# Patient Record
Sex: Female | Born: 1985 | Hispanic: No | Marital: Married | Smoking: Never smoker
Health system: Southern US, Community
[De-identification: ages and names within clinical notes are randomized; demographics above are authoritative.]

## PROBLEM LIST (undated history)

## (undated) DIAGNOSIS — R05 Cough: Secondary | ICD-10-CM

## (undated) DIAGNOSIS — J3489 Other specified disorders of nose and nasal sinuses: Secondary | ICD-10-CM

## (undated) DIAGNOSIS — R112 Nausea with vomiting, unspecified: Secondary | ICD-10-CM

## (undated) DIAGNOSIS — R079 Chest pain, unspecified: Secondary | ICD-10-CM

## (undated) DIAGNOSIS — N739 Female pelvic inflammatory disease, unspecified: Secondary | ICD-10-CM

## (undated) DIAGNOSIS — R6883 Chills (without fever): Secondary | ICD-10-CM

## (undated) DIAGNOSIS — R0600 Dyspnea, unspecified: Secondary | ICD-10-CM

## (undated) DIAGNOSIS — R51 Headache: Secondary | ICD-10-CM

## (undated) DIAGNOSIS — R519 Headache, unspecified: Secondary | ICD-10-CM

## (undated) DIAGNOSIS — R058 Other specified cough: Secondary | ICD-10-CM

## (undated) HISTORY — DX: Nausea with vomiting, unspecified: R11.2

---

## 2008-08-24 ENCOUNTER — Emergency Department (HOSPITAL_COMMUNITY): Admission: EM | Admit: 2008-08-24 | Discharge: 2008-08-24 | Payer: Self-pay | Admitting: Emergency Medicine

## 2008-09-27 ENCOUNTER — Emergency Department (HOSPITAL_COMMUNITY): Admission: EM | Admit: 2008-09-27 | Discharge: 2008-09-27 | Payer: Self-pay | Admitting: Emergency Medicine

## 2008-11-22 ENCOUNTER — Emergency Department (HOSPITAL_COMMUNITY): Admission: EM | Admit: 2008-11-22 | Discharge: 2008-11-23 | Payer: Self-pay | Admitting: Emergency Medicine

## 2009-01-22 ENCOUNTER — Emergency Department (HOSPITAL_COMMUNITY): Admission: EM | Admit: 2009-01-22 | Discharge: 2009-01-22 | Payer: Self-pay | Admitting: Family Medicine

## 2009-01-23 ENCOUNTER — Emergency Department (HOSPITAL_COMMUNITY): Admission: EM | Admit: 2009-01-23 | Discharge: 2009-01-23 | Payer: Self-pay | Admitting: Emergency Medicine

## 2009-03-04 ENCOUNTER — Emergency Department (HOSPITAL_COMMUNITY): Admission: EM | Admit: 2009-03-04 | Discharge: 2009-03-04 | Payer: Self-pay | Admitting: Emergency Medicine

## 2009-12-23 ENCOUNTER — Emergency Department (HOSPITAL_COMMUNITY): Admission: EM | Admit: 2009-12-23 | Discharge: 2009-12-23 | Payer: Self-pay | Admitting: Family Medicine

## 2009-12-28 ENCOUNTER — Emergency Department (HOSPITAL_COMMUNITY): Admission: EM | Admit: 2009-12-28 | Discharge: 2009-12-28 | Payer: Self-pay | Admitting: Emergency Medicine

## 2010-04-11 ENCOUNTER — Encounter: Admission: RE | Admit: 2010-04-11 | Discharge: 2010-04-11 | Payer: Self-pay | Admitting: Specialist

## 2011-01-23 ENCOUNTER — Emergency Department (HOSPITAL_COMMUNITY)
Admission: EM | Admit: 2011-01-23 | Discharge: 2011-01-24 | Disposition: A | Discharge status: Home / Self Care | Payer: Medicaid Other | Attending: Emergency Medicine | Admitting: Emergency Medicine

## 2011-01-23 ENCOUNTER — Emergency Department (HOSPITAL_COMMUNITY): Payer: Medicaid Other

## 2011-01-23 DIAGNOSIS — N390 Urinary tract infection, site not specified: Secondary | ICD-10-CM | POA: Insufficient documentation

## 2011-01-23 DIAGNOSIS — K625 Hemorrhage of anus and rectum: Secondary | ICD-10-CM | POA: Insufficient documentation

## 2011-01-23 DIAGNOSIS — R109 Unspecified abdominal pain: Secondary | ICD-10-CM | POA: Insufficient documentation

## 2011-01-23 LAB — COMPREHENSIVE METABOLIC PANEL
Albumin: 3.4 g/dL — ABNORMAL LOW (ref 3.5–5.2)
Alkaline Phosphatase: 73 U/L (ref 39–117)
CO2: 22 mEq/L (ref 19–32)
Creatinine, Ser: 0.59 mg/dL (ref 0.4–1.2)
Total Bilirubin: 0.2 mg/dL — ABNORMAL LOW (ref 0.3–1.2)
Total Protein: 7.2 g/dL (ref 6.0–8.3)

## 2011-01-23 LAB — URINE MICROSCOPIC-ADD ON

## 2011-01-23 LAB — CBC
Hemoglobin: 12.5 g/dL (ref 12.0–15.0)
MCH: 28 pg (ref 26.0–34.0)
Platelets: 319 10*3/uL (ref 150–400)
RBC: 4.46 MIL/uL (ref 3.87–5.11)
WBC: 7.1 10*3/uL (ref 4.0–10.5)

## 2011-01-23 LAB — URINALYSIS, ROUTINE W REFLEX MICROSCOPIC
Bilirubin Urine: NEGATIVE
Protein, ur: NEGATIVE mg/dL
Urine Glucose, Fasting: NEGATIVE mg/dL
Urobilinogen, UA: 0.2 mg/dL (ref 0.0–1.0)

## 2011-01-23 LAB — POCT PREGNANCY, URINE: Preg Test, Ur: NEGATIVE

## 2011-01-23 LAB — DIFFERENTIAL
Basophils Absolute: 0 10*3/uL (ref 0.0–0.1)
Eosinophils Absolute: 0.2 10*3/uL (ref 0.0–0.7)
Eosinophils Relative: 3 % (ref 0–5)
Lymphocytes Relative: 55 % — ABNORMAL HIGH (ref 12–46)

## 2011-01-23 LAB — OCCULT BLOOD, POC DEVICE: Fecal Occult Bld: NEGATIVE

## 2011-01-23 LAB — LIPASE, BLOOD: Lipase: 34 U/L (ref 11–59)

## 2011-01-24 MED ORDER — IOHEXOL 300 MG/ML  SOLN: 100.0000 mL | Freq: Once | INTRAMUSCULAR | Status: DC | PRN

## 2011-01-25 LAB — URINE CULTURE
Colony Count: 3000
Culture  Setup Time: 201202150841

## 2011-03-22 LAB — URINALYSIS, ROUTINE W REFLEX MICROSCOPIC
Bilirubin Urine: NEGATIVE
Glucose, UA: NEGATIVE mg/dL
Urobilinogen, UA: 0.2 mg/dL (ref 0.0–1.0)
pH: 6 (ref 5.0–8.0)

## 2011-03-22 LAB — DIFFERENTIAL
Basophils Absolute: 0.1 10*3/uL (ref 0.0–0.1)
Basophils Relative: 1 % (ref 0–1)
Eosinophils Absolute: 0.1 10*3/uL (ref 0.0–0.7)
Lymphocytes Relative: 34 % (ref 12–46)
Lymphs Abs: 3.6 10*3/uL (ref 0.7–4.0)
Neutro Abs: 6.1 10*3/uL (ref 1.7–7.7)

## 2011-03-22 LAB — CBC
HCT: 37.5 % (ref 36.0–46.0)
Platelets: 213 10*3/uL (ref 150–400)
RBC: 4.52 MIL/uL (ref 3.87–5.11)
RDW: 15.3 % (ref 11.5–15.5)

## 2011-03-22 LAB — COMPREHENSIVE METABOLIC PANEL
Albumin: 4.2 g/dL (ref 3.5–5.2)
Alkaline Phosphatase: 85 U/L (ref 39–117)
Chloride: 104 mEq/L (ref 96–112)
GFR calc Af Amer: >60 mL/min (ref >60)
GFR calc non Af Amer: >60 mL/min (ref >60)
Potassium: 3.9 mEq/L (ref 3.5–5.1)

## 2011-03-22 LAB — HCG, QUANTITATIVE, PREGNANCY: hCG, Beta Chain, Quant, S: <2 m[IU]/mL (ref <5)

## 2011-03-22 LAB — URINE MICROSCOPIC-ADD ON

## 2011-03-27 LAB — COMPREHENSIVE METABOLIC PANEL
ALT: 9 U/L (ref 0–35)
ALT: 9 U/L (ref 0–35)
AST: 14 U/L (ref 0–37)
Albumin: 3.7 g/dL (ref 3.5–5.2)
Albumin: 3.8 g/dL (ref 3.5–5.2)
Alkaline Phosphatase: 76 U/L (ref 39–117)
Alkaline Phosphatase: 80 U/L (ref 39–117)
BUN: 6 mg/dL (ref 6–23)
Calcium: 8.5 mg/dL (ref 8.4–10.5)
Chloride: 105 mEq/L (ref 96–112)
GFR calc Af Amer: >60 mL/min (ref >60)
GFR calc Af Amer: >60 mL/min (ref >60)
Potassium: 3.5 mEq/L (ref 3.5–5.1)
Potassium: 3.7 mEq/L (ref 3.5–5.1)
Sodium: 136 mEq/L (ref 135–145)
Sodium: 138 mEq/L (ref 135–145)
Total Bilirubin: 0.3 mg/dL (ref 0.3–1.2)
Total Protein: 6.5 g/dL (ref 6.0–8.3)
Total Protein: 6.6 g/dL (ref 6.0–8.3)

## 2011-03-27 LAB — URINE CULTURE: Culture: NO GROWTH

## 2011-03-27 LAB — URINALYSIS, ROUTINE W REFLEX MICROSCOPIC
Glucose, UA: NEGATIVE mg/dL
Hgb urine dipstick: NEGATIVE
Hgb urine dipstick: NEGATIVE
Ketones, ur: NEGATIVE mg/dL
Nitrite: NEGATIVE
Protein, ur: NEGATIVE mg/dL
Protein, ur: NEGATIVE mg/dL
Specific Gravity, Urine: 1.008 (ref 1.005–1.030)
Urobilinogen, UA: 0.2 mg/dL (ref 0.0–1.0)
pH: 7 (ref 5.0–8.0)

## 2011-03-27 LAB — DIFFERENTIAL
Basophils Relative: 0 % (ref 0–1)
Basophils Relative: 1 % (ref 0–1)
Eosinophils Absolute: 0.3 10*3/uL (ref 0.0–0.7)
Eosinophils Relative: 2 % (ref 0–5)
Lymphs Abs: 4.4 10*3/uL — ABNORMAL HIGH (ref 0.7–4.0)
Monocytes Absolute: 0.7 10*3/uL (ref 0.1–1.0)
Monocytes Absolute: 0.7 10*3/uL (ref 0.1–1.0)
Monocytes Relative: 7 % (ref 3–12)
Monocytes Relative: 9 % (ref 3–12)
Neutro Abs: 4.2 10*3/uL (ref 1.7–7.7)

## 2011-03-27 LAB — GC/CHLAMYDIA PROBE AMP, GENITAL: GC Probe Amp, Genital: NEGATIVE

## 2011-03-27 LAB — POCT URINALYSIS DIP (DEVICE)
Bilirubin Urine: NEGATIVE
Glucose, UA: NEGATIVE mg/dL
Nitrite: NEGATIVE

## 2011-03-27 LAB — URINE MICROSCOPIC-ADD ON

## 2011-03-27 LAB — CBC
MCHC: 33.8 g/dL (ref 30.0–36.0)
Platelets: 224 10*3/uL (ref 150–400)
Platelets: 228 10*3/uL (ref 150–400)
RDW: 14.6 % (ref 11.5–15.5)
RDW: 15 % (ref 11.5–15.5)
WBC: 9.2 10*3/uL (ref 4.0–10.5)

## 2011-03-27 LAB — WET PREP, GENITAL: Trich, Wet Prep: NONE SEEN

## 2011-03-27 LAB — POCT PREGNANCY, URINE: Preg Test, Ur: NEGATIVE

## 2011-09-11 LAB — URINALYSIS, ROUTINE W REFLEX MICROSCOPIC
Bilirubin Urine: NEGATIVE
Glucose, UA: NEGATIVE
Protein, ur: NEGATIVE

## 2011-09-11 LAB — URINE MICROSCOPIC-ADD ON

## 2011-09-11 LAB — DIFFERENTIAL
Eosinophils Relative: 3
Monocytes Relative: 9
Neutrophils Relative %: 37 — ABNORMAL LOW
nRBC: 0

## 2011-09-11 LAB — CBC
Platelets: 205
WBC: 7.9

## 2011-09-11 LAB — BASIC METABOLIC PANEL
BUN: 6
Creatinine, Ser: 0.47
GFR calc Af Amer: >60
GFR calc non Af Amer: >60
Potassium: 4

## 2011-09-11 LAB — PREGNANCY, URINE: Preg Test, Ur: NEGATIVE

## 2011-09-14 LAB — COMPREHENSIVE METABOLIC PANEL
Albumin: 4 g/dL (ref 3.5–5.2)
BUN: 8 mg/dL (ref 6–23)
Creatinine, Ser: 0.54 mg/dL (ref 0.4–1.2)
Glucose, Bld: 99 mg/dL (ref 70–99)
Total Bilirubin: 0.8 mg/dL (ref 0.3–1.2)
Total Protein: 6.6 g/dL (ref 6.0–8.3)

## 2011-09-14 LAB — URINE MICROSCOPIC-ADD ON

## 2011-09-14 LAB — URINALYSIS, ROUTINE W REFLEX MICROSCOPIC
Nitrite: NEGATIVE
Specific Gravity, Urine: 1.03 (ref 1.005–1.030)
Urobilinogen, UA: 0.2 mg/dL (ref 0.0–1.0)

## 2011-09-14 LAB — DIFFERENTIAL
Basophils Absolute: 0 10*3/uL (ref 0.0–0.1)
Lymphocytes Relative: 58 % — ABNORMAL HIGH (ref 12–46)
Monocytes Absolute: 0.7 10*3/uL (ref 0.1–1.0)
Monocytes Relative: 11 % (ref 3–12)
Neutro Abs: 2 10*3/uL (ref 1.7–7.7)
Neutrophils Relative %: 29 % — ABNORMAL LOW (ref 43–77)

## 2011-09-14 LAB — CBC
HCT: 36.8 % (ref 36.0–46.0)
MCV: 80.4 fL (ref 78.0–100.0)
Platelets: 288 10*3/uL (ref 150–400)
RDW: 17.7 % — ABNORMAL HIGH (ref 11.5–15.5)

## 2014-03-29 ENCOUNTER — Other Ambulatory Visit (HOSPITAL_COMMUNITY): Payer: Self-pay | Admitting: Obstetrics and Gynecology

## 2014-03-29 DIAGNOSIS — R109 Unspecified abdominal pain: Secondary | ICD-10-CM

## 2014-03-29 DIAGNOSIS — R102 Pelvic and perineal pain: Secondary | ICD-10-CM

## 2014-04-07 ENCOUNTER — Ambulatory Visit (HOSPITAL_COMMUNITY): Admission: RE | Admit: 2014-04-07 | Payer: Medicaid Other | Source: Ambulatory Visit

## 2014-04-12 ENCOUNTER — Ambulatory Visit (HOSPITAL_COMMUNITY)
Admission: RE | Admit: 2014-04-12 | Discharge: 2014-04-12 | Disposition: A | Discharge status: Home / Self Care | Payer: BC Managed Care – PPO | Source: Ambulatory Visit | Attending: Obstetrics and Gynecology | Admitting: Obstetrics and Gynecology

## 2014-04-12 DIAGNOSIS — R102 Pelvic and perineal pain: Secondary | ICD-10-CM

## 2014-04-12 DIAGNOSIS — N949 Unspecified condition associated with female genital organs and menstrual cycle: Secondary | ICD-10-CM | POA: Insufficient documentation

## 2014-04-12 DIAGNOSIS — N854 Malposition of uterus: Secondary | ICD-10-CM | POA: Insufficient documentation

## 2014-04-15 ENCOUNTER — Other Ambulatory Visit (HOSPITAL_COMMUNITY): Payer: Self-pay | Admitting: Obstetrics and Gynecology

## 2014-04-15 ENCOUNTER — Other Ambulatory Visit (HOSPITAL_COMMUNITY): Payer: Medicaid Other

## 2014-04-15 DIAGNOSIS — R109 Unspecified abdominal pain: Secondary | ICD-10-CM

## 2014-04-16 ENCOUNTER — Ambulatory Visit (HOSPITAL_COMMUNITY)
Admission: RE | Admit: 2014-04-16 | Discharge: 2014-04-16 | Disposition: A | Discharge status: Home / Self Care | Payer: BC Managed Care – PPO | Source: Ambulatory Visit | Attending: Obstetrics and Gynecology | Admitting: Obstetrics and Gynecology

## 2014-04-16 DIAGNOSIS — R1031 Right lower quadrant pain: Secondary | ICD-10-CM | POA: Insufficient documentation

## 2014-04-16 DIAGNOSIS — R109 Unspecified abdominal pain: Secondary | ICD-10-CM

## 2014-04-19 ENCOUNTER — Ambulatory Visit (HOSPITAL_COMMUNITY): Payer: Medicaid Other

## 2014-05-26 ENCOUNTER — Emergency Department (HOSPITAL_COMMUNITY)
Admission: EM | Admit: 2014-05-26 | Discharge: 2014-05-26 | Disposition: A | Discharge status: Home / Self Care | Payer: BC Managed Care – PPO | Attending: Emergency Medicine | Admitting: Emergency Medicine

## 2014-05-26 ENCOUNTER — Encounter (HOSPITAL_COMMUNITY): Payer: Self-pay | Admitting: Emergency Medicine

## 2014-05-26 DIAGNOSIS — Z79899 Other long term (current) drug therapy: Secondary | ICD-10-CM | POA: Insufficient documentation

## 2014-05-26 DIAGNOSIS — S161XXA Strain of muscle, fascia and tendon at neck level, initial encounter: Secondary | ICD-10-CM

## 2014-05-26 DIAGNOSIS — S139XXA Sprain of joints and ligaments of unspecified parts of neck, initial encounter: Secondary | ICD-10-CM | POA: Insufficient documentation

## 2014-05-26 DIAGNOSIS — Z791 Long term (current) use of non-steroidal anti-inflammatories (NSAID): Secondary | ICD-10-CM | POA: Insufficient documentation

## 2014-05-26 DIAGNOSIS — Y92410 Unspecified street and highway as the place of occurrence of the external cause: Secondary | ICD-10-CM | POA: Insufficient documentation

## 2014-05-26 DIAGNOSIS — Y9389 Activity, other specified: Secondary | ICD-10-CM | POA: Insufficient documentation

## 2014-05-26 MED ORDER — METHOCARBAMOL 500 MG PO TABS: 500.0000 mg | ORAL_TABLET | Freq: Two times a day (BID) | ORAL | Status: DC

## 2014-05-26 MED ORDER — NAPROXEN 500 MG PO TABS: 500.0000 mg | ORAL_TABLET | Freq: Two times a day (BID) | ORAL | Status: DC
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
## 2014-06-27 ENCOUNTER — Encounter (HOSPITAL_COMMUNITY): Payer: Self-pay | Admitting: Emergency Medicine

## 2014-06-27 ENCOUNTER — Emergency Department (HOSPITAL_COMMUNITY): Payer: BC Managed Care – PPO

## 2014-06-27 ENCOUNTER — Emergency Department (HOSPITAL_COMMUNITY)
Admission: EM | Admit: 2014-06-27 | Discharge: 2014-06-28 | Disposition: A | Discharge status: Home / Self Care | Payer: BC Managed Care – PPO | Attending: Emergency Medicine | Admitting: Emergency Medicine

## 2014-06-27 DIAGNOSIS — M25519 Pain in unspecified shoulder: Secondary | ICD-10-CM | POA: Insufficient documentation

## 2014-06-27 DIAGNOSIS — M25512 Pain in left shoulder: Secondary | ICD-10-CM

## 2014-06-27 DIAGNOSIS — Z791 Long term (current) use of non-steroidal anti-inflammatories (NSAID): Secondary | ICD-10-CM | POA: Insufficient documentation

## 2014-06-27 DIAGNOSIS — M79609 Pain in unspecified limb: Secondary | ICD-10-CM | POA: Insufficient documentation

## 2014-06-27 DIAGNOSIS — M79605 Pain in left leg: Secondary | ICD-10-CM

## 2014-06-27 LAB — CBC
HEMATOCRIT: 40.8 % (ref 36.0–46.0)
Hemoglobin: 12.7 g/dL (ref 12.0–15.0)
MCH: 27.6 pg (ref 26.0–34.0)
MCHC: 31.1 g/dL (ref 30.0–36.0)
MCV: 88.7 fL (ref 78.0–100.0)
Platelets: 262 10*3/uL (ref 150–400)
RBC: 4.6 MIL/uL (ref 3.87–5.11)
RDW: 14 % (ref 11.5–15.5)
WBC: 8.4 10*3/uL (ref 4.0–10.5)

## 2014-06-27 LAB — BASIC METABOLIC PANEL
Anion gap: 14 (ref 5–15)
BUN: 9 mg/dL (ref 6–23)
CO2: 24 mEq/L (ref 19–32)
CREATININE: 0.58 mg/dL (ref 0.50–1.10)
Calcium: 9 mg/dL (ref 8.4–10.5)
Chloride: 102 mEq/L (ref 96–112)
Glucose, Bld: 86 mg/dL (ref 70–99)
Potassium: 3.8 mEq/L (ref 3.7–5.3)
Sodium: 140 mEq/L (ref 137–147)

## 2014-06-27 LAB — TROPONIN I: Troponin I: <0.3 ng/mL (ref <0.30)

## 2014-06-27 MED ORDER — NAPROXEN 250 MG PO TABS
500.0000 mg | ORAL_TABLET | Freq: Two times a day (BID) | ORAL | Status: DC
  Administered 2014-06-27: 500 mg via ORAL
  Filled 2014-06-27: qty 2

## 2014-06-27 MED ORDER — TRAMADOL HCL 50 MG PO TABS
50.0000 mg | ORAL_TABLET | Freq: Four times a day (QID) | ORAL | Status: DC | PRN
  Administered 2014-06-27: 50 mg via ORAL
  Filled 2014-06-27: qty 1

## 2014-06-27 MED ORDER — TRAMADOL HCL 50 MG PO TABS: 50.0000 mg | ORAL_TABLET | Freq: Four times a day (QID) | ORAL | Status: DC | PRN

## 2014-06-27 MED ORDER — NAPROXEN 500 MG PO TABS: 500.0000 mg | ORAL_TABLET | Freq: Two times a day (BID) | ORAL | Status: DC

## 2014-06-27 NOTE — ED Notes (Signed)
MD at bedside. 

## 2014-06-27 NOTE — ED Notes (Signed)
Pt. reports chronic pain ( 5 months ) at left lower axilla radiating to left arm worse these past several days , denies injury , no SOB , pain worse when lying flat on bed.

## 2014-06-27 NOTE — ED Notes (Signed)
Patient transported to X-ray 

## 2014-06-27 NOTE — ED Provider Notes (Signed)
CSN: [id number]     Arrival date & time 06/27/14  2206 History   First MD Initiated Contact with Patient 06/27/14 2302     Chief Complaint  Patient presents with  . Chest Pain     (Consider location/radiation/quality/duration/timing/severity/associated sxs/prior Treatment) HPI 28 year old female presents to the emergency department from home with complaint of left shoulder/arm pain and left thigh pain.  She reports the pain has been intermittent for last 5 months.  She denies any specific trauma to these areas.  She denies any specific activity that causes the problems.  Patient indicates her left axilla as the main source of pain.  She has pain with range of motion of the shoulder.  She reports the pain is worse in the lung it waking her from sleep.  She denies any shortness of breath.  Occasionally she will have chest pain, but not in the last several days.  Patient reports pain to the left thigh and hip region.  Worse with increased exertion.  She denies any weakness numbness overlying skin changes.   History reviewed. No pertinent past medical history. Past Surgical History  Procedure Laterality Date  . Cesarean section     No family history on file. History  Substance Use Topics  . Smoking status: Never Smoker   . Smokeless tobacco: Not on file  . Alcohol Use: No   OB History   Grav Para Term Preterm Abortions TAB SAB Ect Mult Living                 Review of Systems  See History of Present Illness; otherwise all other systems are reviewed and negative   Allergies  Review of patient's allergies indicates no known allergies.  Home Medications   Prior to Admission medications   Medication Sig Start Date End Date Taking? Authorizing Provider  ibuprofen (ADVIL,MOTRIN) 200 MG tablet Take 400 mg by mouth 2 (two) times daily as needed (pain).    Yes Historical Provider, MD   BP 126/82  Pulse 95  Temp(Src) 97.5 F (36.4 C) (Oral)  Resp 19  Ht 5\' 3"  (1.6 m)  Wt 145 lb  (65.772 kg)  BMI 25.69 kg/m2  SpO2 100%  LMP 06/22/2014 Physical Exam  Nursing note and vitals reviewed. Constitutional: She is oriented to person, place, and time. She appears well-developed and well-nourished.  HENT:  Head: Normocephalic and atraumatic.  Right Ear: External ear normal.  Left Ear: External ear normal.  Nose: Nose normal.  Mouth/Throat: Oropharynx is clear and moist.  Eyes: Conjunctivae and EOM are normal. Pupils are equal, round, and reactive to light.  Neck: Normal range of motion. Neck supple. No JVD present. No tracheal deviation present. No thyromegaly present.  Cardiovascular: Normal rate, regular rhythm, normal heart sounds and intact distal pulses.  Exam reveals no gallop and no friction rub.   No murmur heard. Pulmonary/Chest: Effort normal and breath sounds normal. No stridor. No respiratory distress. She has no wheezes. She has no rales. She exhibits no tenderness.  Abdominal: Soft. Bowel sounds are normal. She exhibits no distension and no mass. There is no tenderness. There is no rebound and no guarding.  Musculoskeletal: Normal range of motion. She exhibits tenderness. She exhibits no edema.  Patient has tenderness to palpation throughout the left shoulder.  She has no deformity, step-off crepitus skin changes effusion noted.  She has pain with range of motion.  She indicates that the pain radiates down her arm.  Patient also complains of pain with  palpation of her left upper leg from hip to knee.  Again no appreciable swelling skin changes no limitation in range of motion aside from some pain.  Lymphadenopathy:    She has no cervical adenopathy.  Neurological: She is alert and oriented to person, place, and time. She has normal reflexes. No cranial nerve deficit. She exhibits normal muscle tone. Coordination normal.  Skin: Skin is warm and dry. No rash noted. No erythema. No pallor.  Psychiatric: She has a normal mood and affect. Her behavior is normal.  Judgment and thought content normal.    ED Course  Procedures (including critical care time) Labs Review Labs Reviewed  CBC  BASIC METABOLIC PANEL  TROPONIN I    Imaging Review Dg Chest 2 View  06/27/2014   CLINICAL DATA:  Chest pain for 4 days, worse with inspiration.  EXAM: CHEST  2 VIEW  COMPARISON:  None.  FINDINGS: The lungs are well-aerated and clear. There is no evidence of focal opacification, pleural effusion or pneumothorax.  The heart is normal in size; the mediastinal contour is within normal limits. No acute osseous abnormalities are seen.  IMPRESSION: No acute cardiopulmonary process seen.   Electronically Signed   By: Roanna Raider M.D.   On: 06/27/2014 23:06     EKG Interpretation   Date/Time:  Sunday June 27 2014 22:11:58 EDT Ventricular Rate:  92 PR Interval:  136 QRS Duration: 82 QT Interval:  352 QTC Calculation: 435 R Axis:   56 Text Interpretation:  Normal sinus rhythm Nonspecific T wave abnormality  Abnormal ECG No old tracing to compare Confirmed by Danny Zimny  MD, Kanishk Stroebel  ([id number]) on 06/27/2014 11:12:27 PM      MDM   Final diagnoses:  Left shoulder pain  Leg pain, diffuse, left    28 year old female with 5 months of intermittent shoulder and leg pain.  No red flags on physical or history.  Patient appears comfortable, has only been taking ibuprofen intermittently.  We'll place her on scheduled Naprosyn and Ultram as needed.  We'll refer her to a primary care Dr. as well as sports medicine.  In   Olivia Mackie, MD 06/28/14 0000

## 2014-06-28 NOTE — Discharge Instructions (Signed)
Arthralgia °Your caregiver has diagnosed you as suffering from an arthralgia. Arthralgia means there is pain in a joint. This can come from many reasons including: °· Bruising the joint which causes soreness (inflammation) in the joint. °· Wear and tear on the joints which occur as we grow older (osteoarthritis). °· Overusing the joint. °· Various forms of arthritis. °· Infections of the joint. °Regardless of the cause of pain in your joint, most of these different pains respond to anti-inflammatory drugs and rest. The exception to this is when a joint is infected, and these cases are treated with antibiotics, if it is a bacterial infection. °HOME CARE INSTRUCTIONS  °· Rest the injured area for as long as directed by your caregiver. Then slowly start using the joint as directed by your caregiver and as the pain allows. Crutches as directed may be useful if the ankles, knees or hips are involved. If the knee was splinted or casted, continue use and care as directed. If an stretchy or elastic wrapping bandage has been applied today, it should be removed and re-applied every 3 to 4 hours. It should not be applied tightly, but firmly enough to keep swelling down. Watch toes and feet for swelling, bluish discoloration, coldness, numbness or excessive pain. If any of these problems (symptoms) occur, remove the ace bandage and re-apply more loosely. If these symptoms persist, contact your caregiver or return to this location. °· For the first 24 hours, keep the injured extremity elevated on pillows while lying down. °· Apply ice for 15-20 minutes to the sore joint every couple hours while awake for the first half day. Then 03-04 times per day for the first 48 hours. Put the ice in a plastic bag and place a towel between the bag of ice and your skin. °· Wear any splinting, casting, elastic bandage applications, or slings as instructed. °· Only take over-the-counter or prescription medicines for pain, discomfort, or fever as  directed by your caregiver. Do not use aspirin immediately after the injury unless instructed by your physician. Aspirin can cause increased bleeding and bruising of the tissues. °· If you were given crutches, continue to use them as instructed and do not resume weight bearing on the sore joint until instructed. °Persistent pain and inability to use the sore joint as directed for more than 2 to 3 days are warning signs indicating that you should see a caregiver for a follow-up visit as soon as possible. Initially, a hairline fracture (break in bone) may not be evident on X-rays. Persistent pain and swelling indicate that further evaluation, non-weight bearing or use of the joint (use of crutches or slings as instructed), or further X-rays are indicated. X-rays may sometimes not show a small fracture until a week or 10 days later. Make a follow-up appointment with your own caregiver or one to whom we have referred you. A radiologist (specialist in reading X-rays) may read your X-rays. Make sure you know how you are to obtain your X-ray results. Do not assume everything is normal if you do not hear from us. °SEEK MEDICAL CARE IF: °Bruising, swelling, or pain increases. °SEEK IMMEDIATE MEDICAL CARE IF:  °· Your fingers or toes are numb or blue. °· The pain is not responding to medications and continues to stay the same or get worse. °· The pain in your joint becomes severe. °· You develop a fever over 102° F (38.9° C). °· It becomes impossible to move or use the joint. °MAKE SURE YOU:  °·   Understand these instructions.  Will watch your condition.  Will get help right away if you are not doing well or get worse. Document Released: 11/26/2005 Document Revised: 02/18/2012 Document Reviewed: 07/14/2008 [facility] Patient Information 2015 [place], [place]. This information is not intended to replace advice given to you by your health care provider. Make sure you discuss any questions you have with your health care  provider.  Heat Therapy Heat therapy can help ease achy, tense, stiff, and tight muscles and joints. Heat should not be used on new injuries. Wait at least 48 hours after the injury before using heat therapy. Heat also should not be used for discomfort or pain that occurs right after doing an activity. If you still have pain or stiffness 3 hours after finishing the activity, then heat therapy may be used. PRECAUTIONS  High heat or prolonged exposure to heat can cause burns. Be careful when using heat therapy to avoid burning your skin. If you have any of the following conditions, do not use heat until you have discussed heat therapy with your caregiver:  Poor circulation.  Healing wounds or scarred skin in the area being treated.  Diabetes, heart disease, or high blood pressure.  Numbness of the area being treated.  Unusual swelling of the area being treated.  Active infections.  Blood clots.  Cancer.  Inability to communicate your response to pain. This can include young children and people with dementia. HOME CARE INSTRUCTIONS Moist heat pack  Soak a clean towel in warm water, and squeeze out the extra water. The water temperature should be comfortable to the skin.  Put the warm, wet towel in a plastic bag.  Place a thin, dry towel between your skin and the bag.  Put the heat pack on the area for 5 minutes, and check your skin. Your skin may be pink, but it should not be red.  Leave the heat pack on the area for a total of 15 to 30 minutes.  Repeat this every 2 to 4 hours while awake. Do not use heat while you are sleeping. Warm water bath  Fill a tub with warm water. The water temperature should be comfortable to the skin.  Place the affected body part in the tub.  Soak the area for 20 to 40 minutes.  Repeat as needed. Hot water bottle  Fill the water bottle half full with hot water.  Press out the extra air. Close the cap tightly.  Place a dry towel between  your skin and the bottle.  Put the bottle on the area for 5 minutes, and check your skin. Your skin may be pink, but it should not be red.  Leave the bottle on the area for a total of 15 to 30 minutes.  Repeat this every 2 to 4 hours while awake. Electric heating pad  Place a dry towel between your skin and the heating pad.  Set the heating pad on low heat.  Put the heating pad on the area for 10 minutes, and check your skin. Your skin may be pink, but it should not be red.  Leave the heating pad on the area for a total of 20 to 40 minutes.  Repeat this every 2 to 4 hours while awake.  Do not lie on the heating pad.  Do not fall asleep while using the heating pad.  Do not use the heating pad near water. Contact with water can result in an electrical shock. SEEK MEDICAL CARE IF:  You  have blisters, redness, swelling, or numbness.  You have any new problems.  Your problems are getting worse.  You have any questions or concerns. If you develop any problems, stop using heat therapy until you see your caregiver. MAKE SURE YOU:  Understand these instructions.  Will watch your condition.  Will get help right away if you are not doing well or get worse. Document Released: 02/18/2012 Document Reviewed: 02/18/2012 [facility] Patient Information 2015 [place], [place]. This information is not intended to replace advice given to you by your health care provider. Make sure you discuss any questions you have with your health care provider.  Musculoskeletal Pain Musculoskeletal pain is muscle and boney aches and pains. These pains can occur in any part of the body. Your caregiver may treat you without knowing the cause of the pain. They may treat you if blood or urine tests, X-rays, and other tests were normal.  CAUSES There is often not a definite cause or reason for these pains. These pains may be caused by a type of germ (virus). The discomfort may also come from overuse. Overuse  includes working out too hard when your body is not fit. Boney aches also come from weather changes. Bone is sensitive to atmospheric pressure changes. HOME CARE INSTRUCTIONS   Ask when your test results will be ready. Make sure you get your test results.  Only take over-the-counter or prescription medicines for pain, discomfort, or fever as directed by your caregiver. If you were given medications for your condition, do not drive, operate machinery or power tools, or sign legal documents for 24 hours. Do not drink alcohol. Do not take sleeping pills or other medications that may interfere with treatment.  Continue all activities unless the activities cause more pain. When the pain lessens, slowly resume normal activities. Gradually increase the intensity and duration of the activities or exercise.  During periods of severe pain, bed rest may be helpful. Lay or sit in any position that is comfortable.  Putting ice on the injured area.  Put ice in a bag.  Place a towel between your skin and the bag.  Leave the ice on for 15 to 20 minutes, 3 to 4 times a day.  Follow up with your caregiver for continued problems and no reason can be found for the pain. If the pain becomes worse or does not go away, it may be necessary to repeat tests or do additional testing. Your caregiver may need to look further for a possible cause. SEEK IMMEDIATE MEDICAL CARE IF:  You have pain that is getting worse and is not relieved by medications.  You develop chest pain that is associated with shortness or breath, sweating, feeling sick to your stomach (nauseous), or throw up (vomit).  Your pain becomes localized to the abdomen.  You develop any new symptoms that seem different or that concern you. MAKE SURE YOU:   Understand these instructions.  Will watch your condition.  Will get help right away if you are not doing well or get worse. Document Released: 11/26/2005 Document Revised: 02/18/2012 Document  Reviewed: 07/31/2013 [facility] Patient Information 2015 [place], [place]. This information is not intended to replace advice given to you by your health care provider. Make sure you discuss any questions you have with your health care provider.    Emergency Department Resource Guide 1) Find a Doctor and Pay Out of Pocket Although you won't have to find out who is covered by your insurance plan, it is a good  idea to ask around and get recommendations. You will then need to call the office and see if the doctor you have chosen will accept you as a new patient and what types of options they offer for patients who are self-pay. Some doctors offer discounts or will set up payment plans for their patients who do not have insurance, but you will need to ask so you aren't surprised when you get to your appointment.  2) Contact Your Local Health Department Not all health departments have doctors that can see patients for sick visits, but many do, so it is worth a call to see if yours does. If you don't know where your local health department is, you can check in your phone book. The CDC also has a tool to help you locate your state's health department, and many state websites also have listings of all of their local health departments.  3) Find a Walk-in Clinic If your illness is not likely to be very severe or complicated, you may want to try a walk in clinic. These are popping up all over the country in pharmacies, drugstores, and shopping centers. They're usually staffed by nurse practitioners or physician assistants that have been trained to treat common illnesses and complaints. They're usually fairly quick and inexpensive. However, if you have serious medical issues or chronic medical problems, these are probably not your best option.  No Primary Care Doctor: - Call Health Connect at  [phone] - they can help you locate a primary care doctor that  accepts your insurance, provides certain services,  etc. - Physician Referral Service- [phone]  Chronic Pain Problems: Organization         Address  Phone   Notes  [facility]  [phone] Patients need to be referred by their primary care doctor.   Medication Assistance: Organization         Address  Phone   Notes  [facility] Medication [facility] [address] [place] [place], [place] [address] [phone] --Must be a resident of [facility] -- Must have NO insurance coverage whatsoever (no Medicaid/ Medicare, etc.) -- The pt. MUST have a primary care doctor that directs their care regularly and follows them in the community   [facility]  [phone]   [employer]  [phone]    Agencies that provide inexpensive medical care: Organization         Address  Phone   Notes  [facility]  [phone]   [facility]    [phone]   [facility] [address] [place], [place] [address] [phone]   [facility] [address] [place] [place] [phone]   Planned Parenthood    [phone]   [facility]    [phone]   [facility]  [address], [place] Phone:  [phone], Fax:  [phone] Hours of Operation:  9 am - 6 pm, M-F.  Also accepts Medicaid/Medicare and self-pay.  [facility]  [address], [place] Phone: [phone], Fax: [phone]. Hours of Operation:  8:30 am - 5:30 pm, M-F.  Also accepts Medicaid and self-pay.  [facility] [place] [address], [place] Phone: [phone]   [facility] [address] Natasha Bence [place], [place] [phone], Ext. [phone] Mondays & Thursdays: 7-9 AM.  First 15 patients  are seen on a first come, first serve basis.    Medicaid-accepting [facility] Providers:  Organization         Address  Phone   Notes  [facility] [address], [place] [phone] Also accepts self-pay patients.  [facility] [address] Laurell Josephs [place], [place]  [phone]   [facility] [address], [place] [phone]   [facility] [address], [place] [phone]   Renaye Rakers [address], [place]   [phone] Only accepts [place] Access [place] patients after they have their name applied to their card.   Self-Pay (no insurance) in [facility]:  Organization         Address  Phone   Notes  Sickle Cell Patients, [facility] Internal Medicine [address] [place], [place] [phone]   [facility] [address] [place], [place] [phone]   [facility] [place]  [address],  [phone]   [facility]/Dr. Osei-Bonsu  [address], [place] or [address], [place] [phone] Phone number for both [place] and [place] locations is the same.  [facility] [address], [place] [phone]   [facility] [address], [place] or [address] [phone]   [facility] [address], [place] [phone], phone; [phone], fax Sees patients 1st and 3rd Saturday of every month.  Must not qualify for public or private insurance (i.e. Medicaid, Medicare, [place] Health Choice, Veterans' Benefits)  Household income should be no more than 200% of the poverty level The clinic cannot treat you if you are pregnant or think you are pregnant  Sexually transmitted diseases are not treated at the clinic.    Dental Care: Organization         Address  Phone  Notes  [facility] of [employer] [facility] [address] [place], [place] [phone] Accepts children up to  age 73 who are enrolled in [place] or [place] Health Choice; pregnant women with a Medicaid card; and children who have applied for Medicaid or [place] Health Choice, but were declined, whose parents can pay a reduced fee at time of service.  [facility] Department of [facility]  [address], [place] [phone] Accepts children up to age 77 who are enrolled in [place] or [place] Health Choice; pregnant women with a Medicaid card; and children who have applied for Medicaid or [place] Health Choice, but were declined, whose parents can pay a reduced fee at time of service.  [facility]  [address] [place], [place] [phone] Patients are seen by appointment only. Walk-ins are not accepted. [facility] will see patients 20 years of age and older. Monday - Tuesday (8am-5pm) Most Wednesdays (8:30-5pm) $30 per visit, cash only  [facility]  [address], [facility] [phone] Patients are seen by appointment only. Walk-ins are not accepted. [facility] will see patients 81 years of age and older. One Wednesday Evening (Monthly: Volunteer Based).  $30 per visit, cash only  [employer]  [phone] for adults; Children under age 22, call Graduate Pediatric Dentistry at (  [phone]. Children aged 30-14, please call [phone] to request a pediatric application.  Dental services are provided in all areas of dental care including fillings, crowns and bridges, complete and partial dentures, implants, gum treatment, root canals, and extractions. Preventive care is also provided. Treatment is provided to both adults and children. Patients are selected via a lottery and there is often a waiting list.   [facility] [address] [place]  [phone] [email or web address]   [facility] [address] [place], [place] [phone], Ext. [phone] Second and Fourth Thursday of  each month, opens at 6:30 AM; Clinic ends at 9 AM.  Patients are seen on a first-come first-served basis, and a limited number are seen during each clinic.   [facility]  [address] Ether Griffins [place], [place] [phone]   Eligibility Requirements You must have lived in [place], [place], or [place] counties for at least the last three months.   You cannot be eligible for state or federal sponsored [place], including [employer], [place], or [employer].   You generally cannot be eligible for healthcare insurance through your employer.    How to apply: Eligibility screenings are held every Tuesday and Wednesday afternoon from 1:00 pm until 4:00 pm. You do not need an appointment for the interview!  [facility] [address], [place], [place] [phone]   [facility] Health Department  [phone]   [facility] Health Department  [phone]   [facility] Health Department  [phone]    Behavioral Health Resources in the Community: Intensive Outpatient Programs Organization         Address  Phone  Notes  [facility] Services [address], [place], [place] [phone]   [facility] Outpatient [address] [place], [place] [phone]   [facility] [address] [place], [place]  [phone]   [facility] Mental Health [address],  [place], [place] [phone] or [phone]   Substance Abuse Resources Organization         Address  Phone  Notes  [facility]  [phone]   [facility]  [phone]   [facility]  [phone]   Floydene Flock  ([phone]   Residential & Outpatient Substance Abuse Program  [phone]   Psychological Services Organization         Address  Phone  Notes  [facility] Behavioral Health  [phone]   [facility]  [phone]   [facility] Mental Health [address]  [place] [phone] or [phone]    Mobile Crisis Teams Organization         Address  Phone  Notes  [facility], Mobile Crisis Care Unit  [phone]   [facility]  [address] [place], [place] [phone]   Doristine Locks [address] [place] [phone]    Self-Help/Support Groups Organization         Address  Phone             Notes  [facility] [place] - variety of support groups  336- I7437963 Call for more information  Narcotics Anonymous (NA), [facility] [address], [employer] [place]  2 meetings at this location   Chief Executive Officer  Notes  [facility] [address],    [place]  [phone]   [facility]  [address] [place] [address], [place], [place] [phone]   [facility] [address] [place], [place] [phone] Admissions: 8am-3pm M-F  [facility] [address]    [place], [place] [phone]   [facility] [address] [place], [place], [place] [phone]   [facility] [address]  [place], [place] [phone]   [facility] - Intensive Outpatient [address] Laurell Josephs [address], [place], [place] [phone]   [facility] ([facility] [address]  [place], [place] [phone] or [phone]   [facility] ([facility]) [address] [place], [place] [phone] Accepts Medicaid  [facility] [address]  [place] [phone] Substance Abuse/Addiction Treatment   [facility] Organization         Address  Phone  Notes  [facility]  [phone]   Angie Fava, PhD [address] Ervin Knack [place], [place]   [phone] or [phone]   [facility]   [address] [place], [place] [phone]     [facility] [address] [place], [place] [phone] Insurance/Medicaid/sponsorship through [facility] and Families [address]                                    [place], [place] [phone] Therapy/tele-psych/case  [facility] [address], [place] [phone]    Dr. Lolly Mustache  [phone]   [facility] [place]  [employer] [facility] Dept. 1) [address] [place] 2) [address], [place] 3)  [address], [place] [phone]  [phone]   [facility] Child Abuse Hotline (385) [phone] or [phone] (After Hours)

## 2015-03-07 ENCOUNTER — Emergency Department (HOSPITAL_COMMUNITY): Payer: BLUE CROSS/BLUE SHIELD

## 2015-03-07 ENCOUNTER — Emergency Department (HOSPITAL_COMMUNITY)
Admission: EM | Admit: 2015-03-07 | Discharge: 2015-03-07 | Disposition: A | Discharge status: Home / Self Care | Payer: BLUE CROSS/BLUE SHIELD | Attending: Emergency Medicine | Admitting: Emergency Medicine

## 2015-03-07 ENCOUNTER — Encounter (HOSPITAL_COMMUNITY): Payer: Self-pay | Admitting: Emergency Medicine

## 2015-03-07 DIAGNOSIS — R51 Headache: Secondary | ICD-10-CM | POA: Diagnosis not present

## 2015-03-07 DIAGNOSIS — M791 Myalgia: Secondary | ICD-10-CM | POA: Diagnosis not present

## 2015-03-07 DIAGNOSIS — R112 Nausea with vomiting, unspecified: Secondary | ICD-10-CM | POA: Insufficient documentation

## 2015-03-07 DIAGNOSIS — R1084 Generalized abdominal pain: Secondary | ICD-10-CM | POA: Diagnosis not present

## 2015-03-07 DIAGNOSIS — Z79899 Other long term (current) drug therapy: Secondary | ICD-10-CM | POA: Insufficient documentation

## 2015-03-07 DIAGNOSIS — K5900 Constipation, unspecified: Secondary | ICD-10-CM | POA: Insufficient documentation

## 2015-03-07 DIAGNOSIS — Z3202 Encounter for pregnancy test, result negative: Secondary | ICD-10-CM | POA: Insufficient documentation

## 2015-03-07 DIAGNOSIS — N898 Other specified noninflammatory disorders of vagina: Secondary | ICD-10-CM | POA: Insufficient documentation

## 2015-03-07 DIAGNOSIS — R509 Fever, unspecified: Secondary | ICD-10-CM | POA: Diagnosis not present

## 2015-03-07 DIAGNOSIS — Z791 Long term (current) use of non-steroidal anti-inflammatories (NSAID): Secondary | ICD-10-CM | POA: Diagnosis not present

## 2015-03-07 DIAGNOSIS — R109 Unspecified abdominal pain: Secondary | ICD-10-CM | POA: Diagnosis present

## 2015-03-07 LAB — URINALYSIS, ROUTINE W REFLEX MICROSCOPIC
Bilirubin Urine: NEGATIVE
GLUCOSE, UA: NEGATIVE mg/dL
Ketones, ur: NEGATIVE mg/dL
Nitrite: NEGATIVE
PH: 6 (ref 5.0–8.0)
Protein, ur: NEGATIVE mg/dL
SPECIFIC GRAVITY, URINE: 1.036 — AB (ref 1.005–1.030)
UROBILINOGEN UA: 0.2 mg/dL (ref 0.0–1.0)

## 2015-03-07 LAB — CBC WITH DIFFERENTIAL/PLATELET
BASOS ABS: 0 10*3/uL (ref 0.0–0.1)
Basophils Relative: 0 % (ref 0–1)
EOS ABS: 0.3 10*3/uL (ref 0.0–0.7)
Eosinophils Relative: 2 % (ref 0–5)
HCT: 40.1 % (ref 36.0–46.0)
HEMOGLOBIN: 13.1 g/dL (ref 12.0–15.0)
LYMPHS ABS: 4 10*3/uL (ref 0.7–4.0)
Lymphocytes Relative: 30 % (ref 12–46)
MCH: 28.8 pg (ref 26.0–34.0)
MCHC: 32.7 g/dL (ref 30.0–36.0)
MCV: 88.1 fL (ref 78.0–100.0)
MONOS PCT: 7 % (ref 3–12)
Monocytes Absolute: 0.9 10*3/uL (ref 0.1–1.0)
Neutro Abs: 8.2 10*3/uL — ABNORMAL HIGH (ref 1.7–7.7)
Neutrophils Relative %: 61 % (ref 43–77)
Platelets: 209 10*3/uL (ref 150–400)
RBC: 4.55 MIL/uL (ref 3.87–5.11)
RDW: 13.2 % (ref 11.5–15.5)
WBC: 13.4 10*3/uL — AB (ref 4.0–10.5)

## 2015-03-07 LAB — COMPREHENSIVE METABOLIC PANEL
ALK PHOS: 76 U/L (ref 39–117)
ALT: 12 U/L (ref 0–35)
ANION GAP: 6 (ref 5–15)
AST: 22 U/L (ref 0–37)
Albumin: 3.4 g/dL — ABNORMAL LOW (ref 3.5–5.2)
BUN: 5 mg/dL — AB (ref 6–23)
CHLORIDE: 106 mmol/L (ref 96–112)
CO2: 24 mmol/L (ref 19–32)
Calcium: 8.5 mg/dL (ref 8.4–10.5)
Creatinine, Ser: 0.61 mg/dL (ref 0.50–1.10)
GLUCOSE: 97 mg/dL (ref 70–99)
Potassium: 3.6 mmol/L (ref 3.5–5.1)
Sodium: 136 mmol/L (ref 135–145)
Total Bilirubin: 0.6 mg/dL (ref 0.3–1.2)
Total Protein: 6.6 g/dL (ref 6.0–8.3)

## 2015-03-07 LAB — URINE MICROSCOPIC-ADD ON

## 2015-03-07 LAB — WET PREP, GENITAL
Trich, Wet Prep: NONE SEEN
Yeast Wet Prep HPF POC: NONE SEEN

## 2015-03-07 LAB — I-STAT BETA HCG BLOOD, ED (MC, WL, AP ONLY): I-stat hCG, quantitative: <5 m[IU]/mL (ref <5)

## 2015-03-07 LAB — POC URINE PREG, ED: Preg Test, Ur: NEGATIVE

## 2015-03-07 LAB — LIPASE, BLOOD: Lipase: 29 U/L (ref 11–59)

## 2015-03-07 LAB — I-STAT CG4 LACTIC ACID, ED: LACTIC ACID, VENOUS: 0.34 mmol/L — AB (ref 0.5–2.0)

## 2015-03-07 MED ORDER — DICYCLOMINE HCL 20 MG PO TABS: 20.0000 mg | ORAL_TABLET | Freq: Two times a day (BID) | ORAL | Status: DC | PRN

## 2015-03-07 MED ORDER — SODIUM CHLORIDE 0.9 % IV BOLUS (SEPSIS)
1000.0000 mL | Freq: Once | INTRAVENOUS | Status: AC
  Administered 2015-03-07: 1000 mL via INTRAVENOUS

## 2015-03-07 MED ORDER — HYDROMORPHONE HCL 1 MG/ML IJ SOLN
1.0000 mg | Freq: Once | INTRAMUSCULAR | Status: AC
  Administered 2015-03-07: 1 mg via INTRAVENOUS
  Filled 2015-03-07: qty 1

## 2015-03-07 MED ORDER — IOHEXOL 300 MG/ML  SOLN
80.0000 mL | Freq: Once | INTRAMUSCULAR | Status: AC | PRN
  Administered 2015-03-07: 80 mL via INTRAVENOUS

## 2015-03-07 MED ORDER — MORPHINE SULFATE 4 MG/ML IJ SOLN
4.0000 mg | Freq: Once | INTRAMUSCULAR | Status: AC
  Administered 2015-03-07: 4 mg via INTRAVENOUS
  Filled 2015-03-07: qty 1

## 2015-03-07 MED ORDER — IOHEXOL 300 MG/ML  SOLN: 25.0000 mL | Freq: Once | INTRAMUSCULAR | Status: DC | PRN

## 2015-03-07 MED ORDER — METOCLOPRAMIDE HCL 10 MG PO TABS
10.0000 mg | ORAL_TABLET | Freq: Four times a day (QID) | ORAL | Status: DC | PRN
Start: 2015-03-07 — End: 2015-09-14

## 2015-03-07 MED ORDER — MORPHINE SULFATE 4 MG/ML IJ SOLN
4.0000 mg | Freq: Once | INTRAMUSCULAR | Status: AC
Start: 2015-03-07 — End: 2015-03-07
  Administered 2015-03-07: 4 mg via INTRAVENOUS
  Filled 2015-03-07: qty 1

## 2015-03-07 MED ORDER — PROMETHAZINE HCL 25 MG/ML IJ SOLN
25.0000 mg | Freq: Once | INTRAMUSCULAR | Status: AC
  Administered 2015-03-07: 25 mg via INTRAVENOUS
  Filled 2015-03-07: qty 1

## 2015-03-07 MED ORDER — ONDANSETRON HCL 4 MG/2ML IJ SOLN: 4.0000 mg | Freq: Once | INTRAMUSCULAR | Status: AC

## 2015-03-07 MED ORDER — ONDANSETRON HCL 4 MG/2ML IJ SOLN
4.0000 mg | Freq: Once | INTRAMUSCULAR | Status: AC
  Administered 2015-03-07: 4 mg via INTRAVENOUS
  Filled 2015-03-07: qty 2

## 2015-03-07 MED ORDER — METRONIDAZOLE 500 MG PO TABS
500.0000 mg | ORAL_TABLET | Freq: Two times a day (BID) | ORAL | Status: DC
Start: 2015-03-07 — End: 2015-03-11

## 2015-03-07 MED ORDER — IOHEXOL 300 MG/ML  SOLN: 100.0000 mL | Freq: Once | INTRAMUSCULAR | Status: DC | PRN

## 2015-03-07 NOTE — ED Notes (Signed)
C/O of pain in Right thigh for two weeks (this needs confirmed with interpreter)

## 2015-03-07 NOTE — ED Notes (Signed)
Pt in CT.

## 2015-03-07 NOTE — Discharge Instructions (Signed)
Read the information below.  Use the prescribed medication as directed.  Please discuss all new medications with your pharmacist.  You may return to the Emergency Department at any time for worsening condition or any new symptoms that concern you.  If you develop high fevers, worsening abdominal pain, uncontrolled vomiting, or are unable to tolerate fluids by mouth, return to the ER for a recheck.        .      .        .                  .                                .  qara'at almaelumat alwaridat 'adnah . aistikhdam alddiwa' almawsuf wifqaan litawjihat . yrja munaqashat jmye al'adwiat aljadidat mae alssaydli . [place] aleawdat 'iilaa qism alttawari fi 'ay waqt l tadahwur halat 'aw 'ay 'aerad jadidat alty tuhimm lik. 'iidha kunt fi wade airtifae darajat hararatihim , watafaqim alam albatn walqay' ghyr almundabit , 'aw ghyr qadir ealaa tahmil alssawayil ean tariq alfamm , waleawdat 'iilaa layihat l 'iieadat tadqiq .

## 2015-03-07 NOTE — ED Notes (Signed)
Pt finished her contrast and notified CT

## 2015-03-07 NOTE — ED Provider Notes (Signed)
CSN: [id number]     Arrival date & time 03/07/15  0545 History   First MD Initiated Contact with Patient 03/07/15 [phone]     Chief Complaint  Patient presents with  . Abdominal Pain  . Emesis  . Nausea     (Consider location/radiation/quality/duration/timing/severity/associated sxs/prior Treatment) Patient is a 29 y.o. female presenting with abdominal pain and vomiting. The history is provided by the patient and the spouse. The history is limited by a language barrier. A language interpreter was used.  Abdominal Pain Associated symptoms: chills, constipation, fever, nausea and vomiting   Associated symptoms: no chest pain, no cough, no diarrhea, no dysuria, no shortness of breath, no vaginal bleeding and no vaginal discharge   Emesis Associated symptoms: abdominal pain, chills, headaches and myalgias   Associated symptoms: no diarrhea     Patient presents with one week of abdominal pain, significantly worse since yesterday, it is located in her upper abdomen, worse with eating and movement.  Associated N/V, constipation, and headache.  [facility] and states this is improved.  On cipro, fluconazole.  Husband notes the medication seems hard on her stomach.  Also has associated headache.  Had IUD removed last month and had bleeding afterwards, has not seen the return of a normal period yet.  Last BM was yesterday.  Hx abdominal surgery: c-section only.  Denies chest pain, cough.    Pt speaks Arabic.  Spoke with patient both through her husband and through the interpreter phone.     History reviewed. No pertinent past medical history. Past Surgical History  Procedure Laterality Date  . Cesarean section     No family history on file. History  Substance Use Topics  . Smoking status: Never Smoker   . Smokeless tobacco: Not on file  . Alcohol Use: No   OB History    No data available     Review of Systems   Constitutional: Positive for fever and chills.  Respiratory: Negative for cough and shortness of breath.   Cardiovascular: Negative for chest pain.  Gastrointestinal: Positive for nausea, vomiting, abdominal pain and constipation. Negative for diarrhea.  Genitourinary: Negative for dysuria, urgency, frequency, vaginal bleeding and vaginal discharge.  Musculoskeletal: Positive for myalgias.  Allergic/Immunologic: Negative for immunocompromised state.  Neurological: Positive for headaches.  Hematological: Does not bruise/bleed easily.      Allergies  Review of patient's allergies indicates no known allergies.  Home Medications   Prior to Admission medications   Medication Sig Start Date End Date Taking? Authorizing Provider  ibuprofen (ADVIL,MOTRIN) 200 MG tablet Take 400 mg by mouth 2 (two) times daily as needed (pain).     Historical Provider, MD  naproxen (NAPROSYN) 500 MG tablet Take 1 tablet (500 mg total) by mouth 2 (two) times daily with a meal. 06/28/14   Marisa Severin, MD  traMADol (ULTRAM) 50 MG tablet Take 1 tablet (50 mg total) by mouth every 6 (six) hours as needed for moderate pain or severe pain. 06/27/14   Marisa Severin, MD   BP 117/87 mmHg  Pulse 79  Temp(Src) 98.4 F (36.9 C) (Oral)  Ht  (1.6 m)  Wt 140 lb (63.504 kg)  BMI 24.81 kg/m2  SpO2 100%  LMP 01/30/2015 Physical Exam  Constitutional: She appears well-developed and well-nourished. No distress.  Uncomfortable appearing.  HENT:  Head: Normocephalic and atraumatic.  Neck: Neck supple.  Cardiovascular: Normal rate and regular rhythm.   Pulmonary/Chest:  Effort normal and breath sounds normal. No respiratory distress. She has no wheezes. She has no rales.  Abdominal: Soft. She exhibits no distension. There is generalized tenderness. There is CVA tenderness (bilateral). There is no rebound and no guarding.  Genitourinary: Uterus is tender. Cervix exhibits discharge. Cervix exhibits no motion tenderness. Right  adnexum displays tenderness. Right adnexum displays no mass and no fullness. Left adnexum displays tenderness. Left adnexum displays no mass and no fullness. No erythema, tenderness or bleeding in the vagina. No foreign body around the vagina. No signs of injury around the vagina. Vaginal discharge found.  Neurological: She is alert.  Skin: She is not diaphoretic.  Nursing note and vitals reviewed.   ED Course  Procedures (including critical care time) Labs Review Labs Reviewed  WET PREP, GENITAL - Abnormal; Notable for the following:    Clue Cells Wet Prep HPF POC MODERATE (*)    WBC, Wet Prep HPF POC MODERATE (*)    All other components within normal limits  CBC WITH DIFFERENTIAL/PLATELET - Abnormal; Notable for the following:    WBC 13.4 (*)    Neutro Abs 8.2 (*)    All other components within normal limits  COMPREHENSIVE METABOLIC PANEL - Abnormal; Notable for the following:    BUN 5 (*)    Albumin 3.4 (*)    All other components within normal limits  URINALYSIS, ROUTINE W REFLEX MICROSCOPIC - Abnormal; Notable for the following:    Specific Gravity, Urine 1.036 (*)    Hgb urine dipstick MODERATE (*)    Leukocytes, UA SMALL (*)    All other components within normal limits  URINE MICROSCOPIC-ADD ON - Abnormal; Notable for the following:    Squamous Epithelial / LPF MANY (*)    All other components within normal limits  I-STAT CG4 LACTIC ACID, ED - Abnormal; Notable for the following:    Lactic Acid, Venous 0.34 (*)    All other components within normal limits  LIPASE, BLOOD  HIV ANTIBODY (ROUTINE TESTING)  POC URINE PREG, ED  I-STAT BETA HCG BLOOD, ED ([facility], [facility], [facility] ONLY)  GC/CHLAMYDIA PROBE AMP ([facility])    Imaging Review Ct Abdomen Pelvis W Contrast  03/07/2015   CLINICAL DATA:  Left lower quadrant pain.  Nausea, vomiting.  EXAM: CT ABDOMEN AND PELVIS WITH CONTRAST  TECHNIQUE: Multidetector CT imaging of the abdomen and pelvis was performed using the standard protocol  following bolus administration of intravenous contrast.  CONTRAST:  80mL OMNIPAQUE IOHEXOL 300 MG/ML  SOLN  COMPARISON:  None.  FINDINGS: The lung bases are clear.  The liver demonstrates no focal abnormality. There is no intrahepatic or extrahepatic biliary ductal dilatation. The gallbladder is normal. The spleen demonstrates no focal abnormality.There are bilateral extrarenal pelvis. There is no urolithiasis. The bladder is distended. The adrenal glands and pancreas are normal. The uterus and ovaries are unremarkable.  The stomach, duodenum, small intestine, and large intestine demonstrate no contrast extravasation or dilatation. There is no pneumoperitoneum, pneumatosis, or portal venous gas. There is no abdominal or pelvic free fluid. There is no lymphadenopathy.  The abdominal aorta is normal in caliber.  There are no lytic or sclerotic osseous lesions.  IMPRESSION: 1. No acute abdominal or pelvic pathology.   Electronically Signed   By: Elige Ko   On: 03/07/2015 11:58     EKG Interpretation None       7:40 AM Repeat abdominal exam.  Tenderness remains diffuse but significant, worst in LUQ and RLQ.  No guarding, no rebound.   12:30 PM Reexamination essentially unchanged.  Tenderness is diffuse.  No guarding, no rebound.  Pelvic performed.   Discussed with Dr Jodi MourningZavitz who will also examine the patient.    MDM   Final diagnoses:  Generalized abdominal pain    Afebrile, very uncomfortable appearing patient with abdominal pain, headache, N/V, constipation.  Significant tenderness throughout abdomen.  Recent treatment for UTI and abnormal vaginal discharge, unclear diagnosis.  Labs remarkable for leukocytosis.  UA with hematuria, possibly partially treated UTI.  Pelvic exam reveals thin white discharge, c/w wet prep finding of BV.   IVF,pain, nausea medication given, with some improvement.  Abdominal exam unchanged throughout visit.  Tolerating PO.  No further vomiting in ED.  CT abd/pelvis  without abnormal findings.  Unclear etiology for patient's pain.  Encouraged to return for changing or worsening symptoms.  D/C home with bentyl, reglan, flagyl.  [facility] referral for follow up.   Discussed result, findings, treatment, and follow up  with patient.  Pt given return precautions.  Pt verbalizes understanding and agrees with plan.        Trixie Dredgemily AmeLie Hollars, PA-C 03/07/15 1431  Elwin MochaBlair Walden, MD 03/07/15 2300

## 2015-03-07 NOTE — ED Notes (Signed)
Patient presents today with abdominal pain for one week with nausea and vomiting for two days. She is one week late on her menstrual cycle. Last BM 03/06/2015 normal in size, color and consistency.

## 2015-03-08 LAB — GC/CHLAMYDIA PROBE AMP (~~LOC~~) NOT AT ARMC
Chlamydia: NEGATIVE
Neisseria Gonorrhea: NEGATIVE

## 2015-03-08 LAB — HIV ANTIBODY (ROUTINE TESTING W REFLEX): HIV Screen 4th Generation wRfx: NONREACTIVE

## 2015-03-10 ENCOUNTER — Inpatient Hospital Stay (HOSPITAL_COMMUNITY)
Admission: EM | Admit: 2015-03-10 | Discharge: 2015-03-11 | DRG: 690 | Disposition: A | Discharge status: Home / Self Care | Payer: BLUE CROSS/BLUE SHIELD | Attending: Internal Medicine | Admitting: Internal Medicine

## 2015-03-10 ENCOUNTER — Emergency Department (HOSPITAL_COMMUNITY): Payer: BLUE CROSS/BLUE SHIELD

## 2015-03-10 ENCOUNTER — Encounter (HOSPITAL_COMMUNITY): Payer: Self-pay | Admitting: Emergency Medicine

## 2015-03-10 DIAGNOSIS — N898 Other specified noninflammatory disorders of vagina: Secondary | ICD-10-CM | POA: Diagnosis present

## 2015-03-10 DIAGNOSIS — N760 Acute vaginitis: Secondary | ICD-10-CM | POA: Diagnosis present

## 2015-03-10 DIAGNOSIS — R112 Nausea with vomiting, unspecified: Secondary | ICD-10-CM | POA: Insufficient documentation

## 2015-03-10 DIAGNOSIS — N730 Acute parametritis and pelvic cellulitis: Secondary | ICD-10-CM

## 2015-03-10 DIAGNOSIS — R102 Pelvic and perineal pain: Secondary | ICD-10-CM

## 2015-03-10 DIAGNOSIS — N644 Mastodynia: Secondary | ICD-10-CM

## 2015-03-10 DIAGNOSIS — N390 Urinary tract infection, site not specified: Principal | ICD-10-CM | POA: Diagnosis present

## 2015-03-10 DIAGNOSIS — N739 Female pelvic inflammatory disease, unspecified: Secondary | ICD-10-CM

## 2015-03-10 DIAGNOSIS — R1110 Vomiting, unspecified: Secondary | ICD-10-CM

## 2015-03-10 DIAGNOSIS — R109 Unspecified abdominal pain: Secondary | ICD-10-CM | POA: Diagnosis present

## 2015-03-10 HISTORY — DX: Urinary tract infection, site not specified: N39.0

## 2015-03-10 HISTORY — DX: Acute parametritis and pelvic cellulitis: N73.0

## 2015-03-10 HISTORY — DX: Female pelvic inflammatory disease, unspecified: N73.9

## 2015-03-10 LAB — WET PREP, GENITAL
Clue Cells Wet Prep HPF POC: NONE SEEN
Trich, Wet Prep: NONE SEEN
Yeast Wet Prep HPF POC: NONE SEEN

## 2015-03-10 LAB — URINALYSIS, ROUTINE W REFLEX MICROSCOPIC
BILIRUBIN URINE: NEGATIVE
Glucose, UA: NEGATIVE mg/dL
Ketones, ur: NEGATIVE mg/dL
NITRITE: NEGATIVE
PROTEIN: NEGATIVE mg/dL
Specific Gravity, Urine: 1.024 (ref 1.005–1.030)
Urobilinogen, UA: 0.2 mg/dL (ref 0.0–1.0)
pH: 6 (ref 5.0–8.0)

## 2015-03-10 LAB — CBC WITH DIFFERENTIAL/PLATELET
Basophils Absolute: 0 10*3/uL (ref 0.0–0.1)
Basophils Relative: 0 % (ref 0–1)
EOS ABS: 0.3 10*3/uL (ref 0.0–0.7)
EOS PCT: 3 % (ref 0–5)
HCT: 40.1 % (ref 36.0–46.0)
Hemoglobin: 13 g/dL (ref 12.0–15.0)
LYMPHS ABS: 4.8 10*3/uL — AB (ref 0.7–4.0)
Lymphocytes Relative: 48 % — ABNORMAL HIGH (ref 12–46)
MCH: 28.3 pg (ref 26.0–34.0)
MCHC: 32.4 g/dL (ref 30.0–36.0)
MCV: 87.4 fL (ref 78.0–100.0)
Monocytes Absolute: 0.7 10*3/uL (ref 0.1–1.0)
Monocytes Relative: 8 % (ref 3–12)
Neutro Abs: 4 10*3/uL (ref 1.7–7.7)
Neutrophils Relative %: 41 % — ABNORMAL LOW (ref 43–77)
Platelets: 247 10*3/uL (ref 150–400)
RBC: 4.59 MIL/uL (ref 3.87–5.11)
RDW: 13.3 % (ref 11.5–15.5)
WBC: 9.8 10*3/uL (ref 4.0–10.5)

## 2015-03-10 LAB — COMPREHENSIVE METABOLIC PANEL
ALT: 12 U/L (ref 0–35)
ANION GAP: 8 (ref 5–15)
AST: 24 U/L (ref 0–37)
Albumin: 3.4 g/dL — ABNORMAL LOW (ref 3.5–5.2)
Alkaline Phosphatase: 70 U/L (ref 39–117)
CO2: 22 mmol/L (ref 19–32)
Calcium: 8.5 mg/dL (ref 8.4–10.5)
Chloride: 107 mmol/L (ref 96–112)
Creatinine, Ser: 0.59 mg/dL (ref 0.50–1.10)
GFR calc Af Amer: >90 mL/min (ref >90)
GFR calc non Af Amer: >90 mL/min (ref >90)
Glucose, Bld: 98 mg/dL (ref 70–99)
Potassium: 3.7 mmol/L (ref 3.5–5.1)
Sodium: 137 mmol/L (ref 135–145)
TOTAL PROTEIN: 6.7 g/dL (ref 6.0–8.3)
Total Bilirubin: 0.3 mg/dL (ref 0.3–1.2)

## 2015-03-10 LAB — URINE MICROSCOPIC-ADD ON

## 2015-03-10 LAB — LIPASE, BLOOD: Lipase: 29 U/L (ref 11–59)

## 2015-03-10 LAB — POC URINE PREG, ED: Preg Test, Ur: NEGATIVE

## 2015-03-10 MED ORDER — MORPHINE SULFATE 4 MG/ML IJ SOLN
4.0000 mg | Freq: Once | INTRAMUSCULAR | Status: AC
  Administered 2015-03-10: 4 mg via INTRAVENOUS
  Filled 2015-03-10: qty 1

## 2015-03-10 MED ORDER — ONDANSETRON HCL 4 MG/2ML IJ SOLN
4.0000 mg | Freq: Once | INTRAMUSCULAR | Status: AC
  Administered 2015-03-10: 4 mg via INTRAVENOUS
  Filled 2015-03-10: qty 2

## 2015-03-10 MED ORDER — DEXTROSE 5 % IV SOLN
2.0000 g | Freq: Two times a day (BID) | INTRAVENOUS | Status: DC
  Administered 2015-03-10 (×2): 2 g via INTRAVENOUS
  Filled 2015-03-10 (×4): qty 2

## 2015-03-10 MED ORDER — PROMETHAZINE HCL 25 MG/ML IJ SOLN
25.0000 mg | INTRAMUSCULAR | Status: DC | PRN
  Administered 2015-03-10: 25 mg via INTRAVENOUS
  Filled 2015-03-10: qty 1

## 2015-03-10 MED ORDER — MECLIZINE HCL 25 MG PO TABS: 25.0000 mg | ORAL_TABLET | Freq: Once | ORAL | Status: DC

## 2015-03-10 MED ORDER — DEXTROSE IN LACTATED RINGERS 5 % IV SOLN
INTRAVENOUS | Status: AC
  Administered 2015-03-10 (×2): via INTRAVENOUS

## 2015-03-10 MED ORDER — PROMETHAZINE HCL 25 MG RE SUPP: 25.0000 mg | Freq: Four times a day (QID) | RECTAL | Status: DC | PRN

## 2015-03-10 MED ORDER — DEXTROSE 5 % IV SOLN
100.0000 mg | Freq: Two times a day (BID) | INTRAVENOUS | Status: DC
  Administered 2015-03-10 – 2015-03-11 (×2): 100 mg via INTRAVENOUS
  Filled 2015-03-10 (×3): qty 100

## 2015-03-10 MED ORDER — DICYCLOMINE HCL 10 MG/ML IM SOLN
20.0000 mg | Freq: Once | INTRAMUSCULAR | Status: AC
  Administered 2015-03-10: 20 mg via INTRAMUSCULAR
  Filled 2015-03-10: qty 2

## 2015-03-10 MED ORDER — KETOROLAC TROMETHAMINE 15 MG/ML IJ SOLN
15.0000 mg | Freq: Four times a day (QID) | INTRAMUSCULAR | Status: DC | PRN
  Administered 2015-03-10 – 2015-03-11 (×3): 15 mg via INTRAVENOUS
  Filled 2015-03-10 (×4): qty 1

## 2015-03-10 MED ORDER — SODIUM CHLORIDE 0.9 % IV BOLUS (SEPSIS)
1000.0000 mL | Freq: Once | INTRAVENOUS | Status: AC
  Administered 2015-03-10: 1000 mL via INTRAVENOUS

## 2015-03-10 MED ORDER — ONDANSETRON 4 MG PO TBDP
4.0000 mg | ORAL_TABLET | Freq: Once | ORAL | Status: AC
  Administered 2015-03-10: 4 mg via ORAL
  Filled 2015-03-10: qty 1

## 2015-03-10 MED ORDER — ENOXAPARIN SODIUM 40 MG/0.4ML ~~LOC~~ SOLN
40.0000 mg | SUBCUTANEOUS | Status: DC
  Administered 2015-03-10: 40 mg via SUBCUTANEOUS
  Filled 2015-03-10 (×2): qty 0.4

## 2015-03-10 MED ORDER — SODIUM CHLORIDE 0.9 % IV BOLUS (SEPSIS)
1000.0000 mL | Freq: Once | INTRAVENOUS | Status: AC
Start: 2015-03-10 — End: 2015-03-10
  Administered 2015-03-10: 1000 mL via INTRAVENOUS

## 2015-03-10 NOTE — ED Notes (Signed)
MD Docherty informed of pain.

## 2015-03-10 NOTE — ED Notes (Signed)
Called ultrasound to inquire about ETA for test. Ultrasound personnel indicates that they have placed patient in queue for transport already.

## 2015-03-10 NOTE — ED Notes (Signed)
Pelvic cart at bedside for Dr Zada GirtKazibwe.

## 2015-03-10 NOTE — ED Provider Notes (Addendum)
0730 - Care from Dr. Micheline Mazeocherty. 71F here with abdominal pain - likely chronic. Is on Cipro for UTI and Flagyl for BV. Negative CT scan 2 days ago. Awaiting Pelvic US.  US negative. Vitals ok.  I also evaluated patient for breast pain - present for a few weeks. No nipple discharge or skin changes. Examined with chaperone present, mild diffuse tenderness. No masses appreciated.  Unable to control N/V. Admitted to internal medicine.  US Pelvis Complete (Final result) Result time: 03/10/15 07:52:23   Final result by Rad Results In Interface (03/10/15 07:52:23)   Narrative:   CLINICAL DATA: 29 year old female with acute pelvic pain. Left lower quadrant pain. Negative urine pregnancy test. IUD removed last month. Initial encounter.  EXAM: TRANSABDOMINAL AND TRANSVAGINAL ULTRASOUND OF PELVIS  TECHNIQUE: Both transabdominal and transvaginal ultrasound examinations of the pelvis were performed. Transabdominal technique was performed for global imaging of the pelvis including uterus, ovaries, adnexal regions, and pelvic cul-de-sac. It was necessary to proceed with endovaginal exam following the transabdominal exam to visualize the ovaries.  COMPARISON: CT Abdomen and Pelvis 03/07/2015  FINDINGS: Uterus  Measurements: 7.5 x 4.1 x 4.3 cm. Retroflexed. No fibroids or other mass visualized.  Endometrium  Thickness: 4 mm. No focal abnormality visualized.  Right ovary  Measurements: 2.3 x 3.1 x 2.6 cm. Oval 15 mm hypoechoic area with mildly thickened wall compatible with corpus luteum. Normal appearance/no adnexal mass.  Left ovary  Measurements: 1.9 x 2.6 x 1.9 cm. Multiple small follicles. Normal appearance/no adnexal mass.  Other findings  Trace simple appearing pelvic free fluid  IMPRESSION: Normal sonographic appearance of the pelvis.   Electronically Signed By: Odessa FlemingH Hall M.D. On: 03/10/2015 07:52          US Transvaginal Non-OB (Final result) Result time:  03/10/15 07:52:23   Final result by Rad Results In Interface (03/10/15 07:52:23)   Narrative:   CLINICAL DATA: 29 year old female with acute pelvic pain. Left lower quadrant pain. Negative urine pregnancy test. IUD removed last month. Initial encounter.  EXAM: TRANSABDOMINAL AND TRANSVAGINAL ULTRASOUND OF PELVIS  TECHNIQUE: Both transabdominal and transvaginal ultrasound examinations of the pelvis were performed. Transabdominal technique was performed for global imaging of the pelvis including uterus, ovaries, adnexal regions, and pelvic cul-de-sac. It was necessary to proceed with endovaginal exam following the transabdominal exam to visualize the ovaries.  COMPARISON: CT Abdomen and Pelvis 03/07/2015  FINDINGS: Uterus  Measurements: 7.5 x 4.1 x 4.3 cm. Retroflexed. No fibroids or other mass visualized.  Endometrium  Thickness: 4 mm. No focal abnormality visualized.  Right ovary  Measurements: 2.3 x 3.1 x 2.6 cm. Oval 15 mm hypoechoic area with mildly thickened wall compatible with corpus luteum. Normal appearance/no adnexal mass.  Left ovary  Measurements: 1.9 x 2.6 x 1.9 cm. Multiple small follicles. Normal appearance/no adnexal mass.  Other findings  Trace simple appearing pelvic free fluid  IMPRESSION: Normal sonographic appearance of the pelvis.   Electronically Signed By: Odessa FlemingH Hall M.D. On: 03/10/2015 07:52   1. Pelvic pain in female      Elwin MochaBlair Kayveon Lennartz, MD 03/10/15 [id number]  Elwin MochaBlair Armina Galloway, MD 03/10/15 [id number]  Elwin MochaBlair Teagon Kron, MD 03/10/15 [phone]

## 2015-03-10 NOTE — ED Notes (Signed)
Pt rolled less than 5 feet from room and began vomiting.  Dr Gwendolyn GrantWalden present and stated the plan is now to admit the patient.  Returned pt to room, changed into gown, and will start new IV.

## 2015-03-10 NOTE — ED Notes (Addendum)
Pt also reports bilat breast pain for unspecified amount of time; no redness or swelling noted

## 2015-03-10 NOTE — ED Notes (Signed)
Patient transported to Ultrasound 

## 2015-03-10 NOTE — ED Notes (Signed)
Dr.Kazibwe at bedside 

## 2015-03-10 NOTE — H&P (Signed)
Date: 03/10/2015               Patient Name:  Nancy Oconnor MRN: [id number]  DOB: Mar 11, 1986 Age / Sex: 29 y.o., female   PCP: No Pcp Per Patient              Medical Service: Internal Medicine Teaching Service              Attending Physician: Dr. Levert Feinstein, MD    First Contact: Dr. Leatha Gilding  Pager: [phone]  Second Contact: Dr. Delane Ginger Pager: [phone]            After Hours (After 5p/  First Contact Pager: [phone]  weekends / holidays): Second Contact Pager: [phone]   Chief Complaint: Abdominal pain, nausea and vomiting  History of Present Illness: This is a 29 year old Arabic speaking woman who is brought by her husband to the ED for severe abdominal pain, nausea and vomiting. She states that her abdominal pain worsened last night, mostly located in the left lower quadrant but generalized as well. Emesis is nonbloody but bilious x multiple times since onset of symptoms. She has been unable tolerate anything by mouth for several days now. She reports subjective fevers and chills. Her pain is 10/10, sharp, constant, radiates to the back bilaterally, without exacerbating or relieving factors. She was seen in the ED on 03/07/2015 and started on outpatient treatment for bacterial vaginosis with metronidazole 500 mg twice a day for a course of 14 days. She has been taking this treatment for 3 days, but she has not experienced any improvement and returned to the ED today for further evaluation. Prior to that she had been seen in a refugee clinic where she had been started on Cipro and Fluconazole for treatment of a UTI according to ED note. She endorses 2 weeks history of foul-smelling vaginal discharge without urinary symptoms. She states that she had an IUD removed about a month ago, and she's been on birth control pills since then, though she is trying to get off. She also states that she has bilateral breast tenderness over the past several days without nipple discharge or swelling.  History is obtained through phone interpreter and husband. I spent ~50 min to obtain history and performing physical exam due to language barrier. Abdominal CT scan with contrast on 03/07/2015 was unremarkable. Ultrasound of the pelvis and a transvaginal ultrasound scan, both performed today were both unremarkable.   Review of Systems: Constitutional: Reports fever, chills, diaphoresis, appetite change and fatigue.  HEENT: reports dry cough without other URI symptoms.  Respiratory: No SOB, DOE, chest tightness, and wheezing. No hemoptysis.  Cardiovascular: No chest pain, palpitations and leg swelling. No PND or Orthopnea. Gastrointestinal: No hematochezia or melena. She reports 2 episodes of nonbloody loose stools this morning. Genitourinary: No dysuria, urgency, frequency, hematuria, flank pain and difficulty urinating.  Musculoskeletal: No myalgias, back pain, joint swelling, arthralgias and gait problem.  Skin: No rash and wound. No easy bruising. Neurological: endorses dizziness and headaches. No seizures, syncope, weakness, LH, and numbness.  Psychiatric/Behavioral: No SI, mood changes, confusion, nervousness, sleep disturbance and agitation  Meds: No current facility-administered medications for this encounter.   Current Outpatient Prescriptions  Medication Sig Dispense Refill  . ciprofloxacin (CIPRO) 500 MG tablet Take 500 mg by mouth 2 (two) times daily.    Marland Kitchen dicyclomine (BENTYL) 20 MG tablet Take 1 tablet (20 mg total) by mouth 2 (two) times daily as needed for spasms (abdominal pain). 20 tablet 0  .  drospirenone-ethinyl estradiol (YAZ,GIANVI,LORYNA) 3-0.02 MG tablet Take 1 tablet by mouth daily.    . fluconazole (DIFLUCAN) 150 MG tablet Take 150 mg by mouth See admin instructions. 1 tablet today, 1 tablet on day 3, 1 tablet on day 6    . ibuprofen (ADVIL,MOTRIN) 200 MG tablet Take 400-600 mg by mouth every 6 (six) hours as needed for headache, mild pain or moderate pain.    Marland Kitchen  metoCLOPramide (REGLAN) 10 MG tablet Take 1 tablet (10 mg total) by mouth every 6 (six) hours as needed for nausea or vomiting. 15 tablet 0  . metroNIDAZOLE (FLAGYL) 500 MG tablet Take 1 tablet (500 mg total) by mouth 2 (two) times daily. 14 tablet 0  . naproxen (NAPROSYN) 500 MG tablet Take 1 tablet (500 mg total) by mouth 2 (two) times daily with a meal. (Patient not taking: Reported on 03/07/2015) 30 tablet 0  . promethazine (PHENERGAN) 25 MG suppository Place 1 suppository (25 mg total) rectally every 6 (six) hours as needed for nausea or vomiting. 12 suppository 0  . traMADol (ULTRAM) 50 MG tablet Take 1 tablet (50 mg total) by mouth every 6 (six) hours as needed for moderate pain or severe pain. (Patient not taking: Reported on 03/07/2015) 30 tablet 0    Allergies: Allergies as of 03/10/2015  . (No Known Allergies)   History reviewed. No pertinent past medical history. Past Surgical History  Procedure Laterality Date  . Cesarean section     History reviewed. No pertinent family history. History   Social History  . Marital Status: Married    Spouse Name: N/A  . Number of Children: N/A  . Years of Education: N/A   Occupational History  . Not on file.   Social History Main Topics  . Smoking status: Never Smoker   . Smokeless tobacco: Not on file  . Alcohol Use: No  . Drug Use: No  . Sexual Activity: Not on file   Other Topics Concern  . Not on file   Social History Narrative  She lives with her husband. She has 2 children. 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 Moved to the [place] in 2012. No alcohol or IV drug use.    Physical Exam: Blood pressure 111/77, pulse 69, temperature 98.2 F (36.8 C), temperature source Oral, resp. rate 15, last menstrual period 01/30/2015, SpO2 98 %.  General: Appears lethargic and in pain. Otherwise well developed, well nourished; cooperative with exam. Husband in room. HEENT: Neck is supple. Head is Atraumatic. Normal EOM. Pupils equal, round and reactive;  anicteric. Nose/throat: oropharynx clear, moist mucous membranes, pink gums  Lungs/Chest wall: CTA bil, normal work of breathing. Bilateral breast tenderness on palpation without appreciable masses. No nipple discharge.  Heart: normal RRR; no murmurs. No JVD Pulses: radial and dorsalis pedis pulses are 2+ and symmetric  Abdomen: Generalized abdominal tenderness, but most severe in the left lower quadrant. Bilateral CVA tenderness. No rebound tenderness or guarding. No rigidity. Bowel sounds are present. No palpable masses.  Pelvic exam: Vulva and vagina appear normal without signs of inflammation. There is increased nonfoul-smelling vaginal discharge. Cervical tenderness is noted.  On bimanual examination she does have adnexal tenderness. Uterus feels normal in size but exquisitely tender. Rectal examination is nornmal Skin: warm, dry, intact, normal turgor, no rashes  Extremities: no peripheral edema, clubbing, or cyanosis Neurologic: A&O X3, CN II - XII are grossly intact. Strength 5/5 in the all 4 extremities, Sensation grossly intact. Psych: Normal mood and affect. Normal speech  and behavior. No agitation  Lab results: Basic Metabolic Panel:  Recent Labs  16/09/9602/31/16 0440  NA 137  K 3.7  CL 107  CO2 22  GLUCOSE 98  BUN <5*  CREATININE 0.59  CALCIUM 8.5   Liver Function Tests:  Recent Labs  03/10/15 0440  AST 24  ALT 12  ALKPHOS 70  BILITOT 0.3  PROT 6.7  ALBUMIN 3.4*    Recent Labs  03/10/15 0440  LIPASE 29   CBC:  Recent Labs  03/10/15 0440  WBC 9.8  NEUTROABS 4.0  HGB 13.0  HCT 40.1  MCV 87.4  PLT 247   Urinalysis:  Recent Labs  03/10/15 0456  COLORURINE AMBER*  LABSPEC 1.024  PHURINE 6.0  GLUCOSEU NEGATIVE  HGBUR TRACE*  BILIRUBINUR NEGATIVE  KETONESUR NEGATIVE  PROTEINUR NEGATIVE  UROBILINOGEN 0.2  NITRITE NEGATIVE  LEUKOCYTESUR MODERATE*    Imaging results:  [place] Transvaginal Non-ob  03/10/2015   CLINICAL DATA:  29 year old female with  acute pelvic pain. Left lower quadrant pain. Negative urine pregnancy test. IUD removed last month. Initial encounter.  EXAM: TRANSABDOMINAL AND TRANSVAGINAL ULTRASOUND OF PELVIS  TECHNIQUE: Both transabdominal and transvaginal ultrasound examinations of the pelvis were performed. Transabdominal technique was performed for global imaging of the pelvis including uterus, ovaries, adnexal regions, and pelvic cul-de-sac. It was necessary to proceed with endovaginal exam following the transabdominal exam to visualize the ovaries.  COMPARISON:  CT Abdomen and Pelvis 03/07/2015  FINDINGS: Uterus  Measurements: 7.5 x 4.1 x 4.3 cm. Retroflexed. No fibroids or other mass visualized.  Endometrium  Thickness: 4 mm.  No focal abnormality visualized.  Right ovary  Measurements: 2.3 x 3.1 x 2.6 cm. Oval 15 mm hypoechoic area with mildly thickened wall compatible with corpus luteum. Normal appearance/no adnexal mass.  Left ovary  Measurements: 1.9 x 2.6 x 1.9 cm. Multiple small follicles. Normal appearance/no adnexal mass.  Other findings  Trace simple appearing pelvic free fluid  IMPRESSION: Normal sonographic appearance of the pelvis.   Electronically Signed   By: Odessa FlemingH  Hall M.D.   On: 03/10/2015 07:52   [place] Pelvis Complete  03/10/2015   CLINICAL DATA:  29 year old female with acute pelvic pain. Left lower quadrant pain. Negative urine pregnancy test. IUD removed last month. Initial encounter.  EXAM: TRANSABDOMINAL AND TRANSVAGINAL ULTRASOUND OF PELVIS  TECHNIQUE: Both transabdominal and transvaginal ultrasound examinations of the pelvis were performed. Transabdominal technique was performed for global imaging of the pelvis including uterus, ovaries, adnexal regions, and pelvic cul-de-sac. It was necessary to proceed with endovaginal exam following the transabdominal exam to visualize the ovaries.  COMPARISON:  CT Abdomen and Pelvis 03/07/2015  FINDINGS: Uterus  Measurements: 7.5 x 4.1 x 4.3 cm. Retroflexed. No fibroids or other  mass visualized.  Endometrium  Thickness: 4 mm.  No focal abnormality visualized.  Right ovary  Measurements: 2.3 x 3.1 x 2.6 cm. Oval 15 mm hypoechoic area with mildly thickened wall compatible with corpus luteum. Normal appearance/no adnexal mass.  Left ovary  Measurements: 1.9 x 2.6 x 1.9 cm. Multiple small follicles. Normal appearance/no adnexal mass.  Other findings  Trace simple appearing pelvic free fluid  IMPRESSION: Normal sonographic appearance of the pelvis.   Electronically Signed   By: Odessa FlemingH  Hall M.D.   On: 03/10/2015 07:52    Other results: EKG: normal EKG, normal sinus rhythm.  Assessment & Plan by Problem: Principal Problem:   Pelvic inflammatory disease (PID)  This is a 29 year old Arabic speaking woman without significant past medical  history who presents with persistent abdominal pain, nausea and vomiting concerning for PID.  Acute pelvic inflammatory disease: Presentation with generalized abdominal pain associated with vaginal discharge are concerning for PID. She has failed/not completed/not tolerated outpatient antibiotic therapy due to N/V. At this point, the next step should be inpatient treatment with IV antibiotics until she is able to tolerate po therapy. Urinalysis is unremarkable for UTI. She had clue cells on wet prep 3 days ago, but these are  negative on repeat today.  CBC and BMP normal. Urine pregnancy test is negative. She is afebrile. Abdominal imaging has been unremarkable including a CT scan performed on her ED visit 3 days ago. Plan - Admit to MedSurg bed. - IV Toradol 15 mg every 6 hrs prn. This is likely to manage her pain better since pain is likely inflammatory in nature. - IV Cefotan 2 g every 12 hours - IV doxycycline 100 mg every 12 hours. She can be switched to PO doxycycline once she can tolerate it. - IV fluids with d5 LR 125cc/hr. She received 2L of NS in ED. - Check orthostatic vitals on admission  - IVs Zofran for nausea and vomiting - Vaginal wet  prep has been sent.  Breast tenderness: Likely this is cyclical breast tenderness. Physical examination does not reveal any nipple discharge or other signs of inflammation. Plan -Managed with anti-inflammatory medications as above.  Code Status: Full code   F/E/N: NPO. Can start clears once vomiting is better.   VTE Ppx: Subq Lovenox  Family Communication: Using a Radio producer, discussed with the patient and her husband about her plan of care and all her questions were answered.  Dispo: Disposition is deferred at this time, awaiting improvement of current medical problems. Anticipated discharge in approximately 2-3 day(s).   The patient does not have a current PCP (No Pcp Per Patient), therefore is require OPC follow-up after discharge.   The patient does not have transportation limitations that hinder transportation to clinic appointments.   Signed:  Dow Adolph, MD PGY-3 Internal Medicine Teaching Service Pager [phone]  03/10/2015, 11:33 AM

## 2015-03-11 LAB — URINALYSIS, ROUTINE W REFLEX MICROSCOPIC
BILIRUBIN URINE: NEGATIVE
Glucose, UA: NEGATIVE mg/dL
HGB URINE DIPSTICK: NEGATIVE
Ketones, ur: NEGATIVE mg/dL
Nitrite: NEGATIVE
Protein, ur: NEGATIVE mg/dL
Specific Gravity, Urine: 1.012 (ref 1.005–1.030)
Urobilinogen, UA: 0.2 mg/dL (ref 0.0–1.0)
pH: 8 (ref 5.0–8.0)

## 2015-03-11 LAB — COMPREHENSIVE METABOLIC PANEL
ALT: 13 U/L (ref 0–35)
ANION GAP: 3 — AB (ref 5–15)
AST: 23 U/L (ref 0–37)
Albumin: 3.1 g/dL — ABNORMAL LOW (ref 3.5–5.2)
Alkaline Phosphatase: 66 U/L (ref 39–117)
BILIRUBIN TOTAL: 0.3 mg/dL (ref 0.3–1.2)
CO2: 30 mmol/L (ref 19–32)
Calcium: 8.6 mg/dL (ref 8.4–10.5)
Chloride: 107 mmol/L (ref 96–112)
Creatinine, Ser: 0.68 mg/dL (ref 0.50–1.10)
GFR calc Af Amer: >90 mL/min (ref >90)
GFR calc non Af Amer: >90 mL/min (ref >90)
Glucose, Bld: 119 mg/dL — ABNORMAL HIGH (ref 70–99)
Potassium: 3.6 mmol/L (ref 3.5–5.1)
Sodium: 140 mmol/L (ref 135–145)
Total Protein: 6.5 g/dL (ref 6.0–8.3)

## 2015-03-11 LAB — CBC
HCT: 39 % (ref 36.0–46.0)
HEMOGLOBIN: 12.5 g/dL (ref 12.0–15.0)
MCH: 28.3 pg (ref 26.0–34.0)
MCHC: 32.1 g/dL (ref 30.0–36.0)
MCV: 88.2 fL (ref 78.0–100.0)
Platelets: 236 10*3/uL (ref 150–400)
RBC: 4.42 MIL/uL (ref 3.87–5.11)
RDW: 13.4 % (ref 11.5–15.5)
WBC: 7.1 10*3/uL (ref 4.0–10.5)

## 2015-03-11 LAB — URINE MICROSCOPIC-ADD ON

## 2015-03-11 LAB — CERVICOVAGINAL ANCILLARY ONLY
Chlamydia: NEGATIVE
Neisseria Gonorrhea: NEGATIVE

## 2015-03-11 MED ORDER — ACETAMINOPHEN 325 MG PO TABS
650.0000 mg | ORAL_TABLET | Freq: Four times a day (QID) | ORAL | Status: DC | PRN
  Administered 2015-03-11: 650 mg via ORAL
  Filled 2015-03-11: qty 2

## 2015-03-11 MED ORDER — ONDANSETRON 4 MG PO TBDP: 4.0000 mg | ORAL_TABLET | Freq: Three times a day (TID) | ORAL | Status: DC | PRN

## 2015-03-11 MED ORDER — CEFUROXIME AXETIL 250 MG PO TABS: 250.0000 mg | ORAL_TABLET | Freq: Two times a day (BID) | ORAL | Status: DC

## 2015-03-11 NOTE — Discharge Summary (Signed)
Name: Nancy Oconnor MRN: [id number] DOB: Jan 13, 1986 29 y.o. PCP: No Pcp Per Patient  Date of Admission: 03/10/2015  4:06 AM Date of Discharge: 03/11/2015 Attending Physician: Earl Lagos, MD  Discharge Diagnosis:  Principal Problem:   Complicated UTI (urinary tract infection) Active Problems:   Nausea with vomiting  Discharge Medications:   Medication List    STOP taking these medications        ciprofloxacin 500 MG tablet  Commonly known as:  CIPRO     fluconazole 150 MG tablet  Commonly known as:  DIFLUCAN     metroNIDAZOLE 500 MG tablet  Commonly known as:  FLAGYL     naproxen 500 MG tablet  Commonly known as:  NAPROSYN     traMADol 50 MG tablet  Commonly known as:  ULTRAM      TAKE these medications        cefUROXime 250 MG tablet  Commonly known as:  CEFTIN  Take 1 tablet (250 mg total) by mouth 2 (two) times daily with a meal.     dicyclomine 20 MG tablet  Commonly known as:  BENTYL  Take 1 tablet (20 mg total) by mouth 2 (two) times daily as needed for spasms (abdominal pain).     drospirenone-ethinyl estradiol 3-0.02 MG tablet  Commonly known as:  YAZ,GIANVI,LORYNA  Take 1 tablet by mouth daily.     ibuprofen 200 MG tablet  Commonly known as:  ADVIL,MOTRIN  Take 400-600 mg by mouth every 6 (six) hours as needed for headache, mild pain or moderate pain.     metoCLOPramide 10 MG tablet  Commonly known as:  REGLAN  Take 1 tablet (10 mg total) by mouth every 6 (six) hours as needed for nausea or vomiting.     ondansetron 4 MG disintegrating tablet  Commonly known as:  ZOFRAN ODT  Take 1 tablet (4 mg total) by mouth every 8 (eight) hours as needed.        Disposition and follow-up:   Ms.Nancy Oconnor was discharged from [facility] in Fair condition.  At the hospital follow up visit please address:  1.  Any further pelvic symptoms  2.  Labs / imaging needed at time of follow-up: none  3.  Pending labs/ test needing  follow-up: UA and UCx (antibiotic susceptibility)  Follow-up Appointments: Follow-up Information    Follow up with Jearld Lesch, MD. Schedule an appointment as soon as possible for a visit in 1 week.   Specialty:  Specialist   Why:  They take walk ins so go in the next week   Contact information:   [address] [place] [address] [phone]       Discharge Instructions: Discharge Instructions    Diet - low sodium heart healthy    Complete by:  As directed      Increase activity slowly    Complete by:  As directed            Consultations:    Procedures Performed:  [place] Transvaginal Non-ob  03/10/2015   CLINICAL DATA:  29 year old female with acute pelvic pain. Left lower quadrant pain. Negative urine pregnancy test. IUD removed last month. Initial encounter.  EXAM: TRANSABDOMINAL AND TRANSVAGINAL ULTRASOUND OF PELVIS  TECHNIQUE: Both transabdominal and transvaginal ultrasound examinations of the pelvis were performed. Transabdominal technique was performed for global imaging of the pelvis including uterus, ovaries, adnexal regions, and pelvic cul-de-sac. It was necessary to proceed with endovaginal exam following the transabdominal exam  to visualize the ovaries.  COMPARISON:  CT Abdomen and Pelvis 03/07/2015  FINDINGS: Uterus  Measurements: 7.5 x 4.1 x 4.3 cm. Retroflexed. No fibroids or other mass visualized.  Endometrium  Thickness: 4 mm.  No focal abnormality visualized.  Right ovary  Measurements: 2.3 x 3.1 x 2.6 cm. Oval 15 mm hypoechoic area with mildly thickened wall compatible with corpus luteum. Normal appearance/no adnexal mass.  Left ovary  Measurements: 1.9 x 2.6 x 1.9 cm. Multiple small follicles. Normal appearance/no adnexal mass.  Other findings  Trace simple appearing pelvic free fluid  IMPRESSION: Normal sonographic appearance of the pelvis.   Electronically Signed   By: Odessa FlemingH  Hall M.D.   On: 03/10/2015 07:52   [place] Pelvis Complete  03/10/2015   CLINICAL DATA:   29 year old female with acute pelvic pain. Left lower quadrant pain. Negative urine pregnancy test. IUD removed last month. Initial encounter.  EXAM: TRANSABDOMINAL AND TRANSVAGINAL ULTRASOUND OF PELVIS  TECHNIQUE: Both transabdominal and transvaginal ultrasound examinations of the pelvis were performed. Transabdominal technique was performed for global imaging of the pelvis including uterus, ovaries, adnexal regions, and pelvic cul-de-sac. It was necessary to proceed with endovaginal exam following the transabdominal exam to visualize the ovaries.  COMPARISON:  CT Abdomen and Pelvis 03/07/2015  FINDINGS: Uterus  Measurements: 7.5 x 4.1 x 4.3 cm. Retroflexed. No fibroids or other mass visualized.  Endometrium  Thickness: 4 mm.  No focal abnormality visualized.  Right ovary  Measurements: 2.3 x 3.1 x 2.6 cm. Oval 15 mm hypoechoic area with mildly thickened wall compatible with corpus luteum. Normal appearance/no adnexal mass.  Left ovary  Measurements: 1.9 x 2.6 x 1.9 cm. Multiple small follicles. Normal appearance/no adnexal mass.  Other findings  Trace simple appearing pelvic free fluid  IMPRESSION: Normal sonographic appearance of the pelvis.   Electronically Signed   By: Odessa FlemingH  Hall M.D.   On: 03/10/2015 07:52   Ct Abdomen Pelvis W Contrast  03/07/2015   CLINICAL DATA:  Left lower quadrant pain.  Nausea, vomiting.  EXAM: CT ABDOMEN AND PELVIS WITH CONTRAST  TECHNIQUE: Multidetector CT imaging of the abdomen and pelvis was performed using the standard protocol following bolus administration of intravenous contrast.  CONTRAST:  80mL OMNIPAQUE IOHEXOL 300 MG/ML  SOLN  COMPARISON:  None.  FINDINGS: The lung bases are clear.  The liver demonstrates no focal abnormality. There is no intrahepatic or extrahepatic biliary ductal dilatation. The gallbladder is normal. The spleen demonstrates no focal abnormality.There are bilateral extrarenal pelvis. There is no urolithiasis. The bladder is distended. The adrenal glands  and pancreas are normal. The uterus and ovaries are unremarkable.  The stomach, duodenum, small intestine, and large intestine demonstrate no contrast extravasation or dilatation. There is no pneumoperitoneum, pneumatosis, or portal venous gas. There is no abdominal or pelvic free fluid. There is no lymphadenopathy.  The abdominal aorta is normal in caliber.  There are no lytic or sclerotic osseous lesions.  IMPRESSION: 1. No acute abdominal or pelvic pathology.   Electronically Signed   By: Elige KoHetal  Patel   On: 03/07/2015 11:58   Admission HPI: This is a 29 year old Arabic speaking woman who is brought by her husband to the ED for severe abdominal pain, nausea and vomiting. She states that her abdominal pain worsened last night, mostly located in the left lower quadrant but generalized as well. Emesis is nonbloody but bilious x multiple times since onset of symptoms. She has been unable tolerate anything by mouth for several days now. She reports  subjective fevers and chills. Her pain is 10/10, sharp, constant, radiates to the back bilaterally, without exacerbating or relieving factors. She was seen in the ED on 03/07/2015 and started on outpatient treatment for bacterial vaginosis with metronidazole 500 mg twice a day for a course of 14 days. She has been taking this treatment for 3 days, but she has not experienced any improvement and returned to the ED today for further evaluation. Prior to that she had been seen in a refugee clinic where she had been started on Cipro and Fluconazole for treatment of a UTI according to ED note. She endorses 2 weeks history of foul-smelling vaginal discharge without urinary symptoms. She states that she had an IUD removed about a month ago, and she's been on birth control pills since then, though she is trying to get off. She also states that she has bilateral breast tenderness over the past several days without nipple discharge or swelling. History is obtained through phone  interpreter and husband. I spent ~50 min to obtain history and performing physical exam due to language barrier. Abdominal CT scan with contrast on 03/07/2015 was unremarkable. Ultrasound of the pelvis and a transvaginal ultrasound scan, both performed today were both unremarkable.   Hospital Course by problem list: Principal Problem:   Complicated UTI (urinary tract infection) Active Problems:   Nausea with vomiting   #UTI: Ms Mirabella was treated with iv cefotan and doxycycline for suspected PID. However, I spoke with gynecology who feel this is not PID given no fever, no leukocytosis, pelvic u/s negative, and GC/chlamydia negative on 3/28. She did however have a UA with moderate leukocyte, few bacteria so she may have had UTI. She did have some mild CVA tenderness on R flank so pyleonephritis considered but kidney normal on CT and no fever, leukocytosis, etc. She is now tolerating po intake well and agreeable with discharge. We will complete 7 days of antibiotics and she is instructed to take ceftin 250 mg po tonight then ceftin 250 mg po bid x 5 days. She was also given zofran Rx for nausea. She has a UCx to be collected so there will be susceptibilities. Her PCP Lerry Liner only accepts walk-ins so she is instructed to go see him within the next week.  #Breast Tenderness: Ms Mcgahee says her bilateral breast tenderness has resolved the morning of discharge. This was likely cyclical breast tenderness and she is instructed to take OTC anti-inflammatories.  Discharge Vitals:   BP 95/61 mmHg  Pulse 71  Temp(Src) 98.1 F (36.7 C) (Oral)  Resp 18  SpO2 100%  LMP 01/30/2015  Discharge Physical Exam: Gen: A&O x 4, No acute distress, well developed, well nourished HEENT: Atraumatic, PERRL, EOMI, sclerae anicteric, moist mucous membranes Heart: Regular rate and rhythm, normal S1 S2, no murmurs, rubs, or gallops Lungs: Clear to auscultation bilaterally, respirations unlabored Abd: Soft,  non-tender, non-distended, + bowel sounds, no hepatosplenomegaly Ext: No edema or cyanosis  Discharge Labs:  Basic Metabolic Panel:  Recent Labs Lab 03/10/15 0440 03/11/15 0442  NA 137 140  K 3.7 3.6  CL 107 107  CO2 22 30  GLUCOSE 98 119*  BUN <5* <5*  CREATININE 0.59 0.68  CALCIUM 8.5 8.6   Liver Function Tests:  Recent Labs Lab 03/10/15 0440 03/11/15 0442  AST 24 23  ALT 12 13  ALKPHOS 70 66  BILITOT 0.3 0.3  PROT 6.7 6.5  ALBUMIN 3.4* 3.1*    Recent Labs Lab 03/07/15 0610 03/10/15 0440  LIPASE 29 29   CBC:  Recent Labs Lab 03/07/15 0610 03/10/15 0440 03/11/15 0442  WBC 13.4* 9.8 7.1  NEUTROABS 8.2* 4.0  --   HGB 13.1 13.0 12.5  HCT 40.1 40.1 39.0  MCV 88.1 87.4 88.2  PLT 209 247 236  Urinalysis:  Recent Labs Lab 03/07/15 0645 03/10/15 0456  COLORURINE YELLOW AMBER*  LABSPEC 1.036* 1.024  PHURINE 6.0 6.0  GLUCOSEU NEGATIVE NEGATIVE  HGBUR MODERATE* TRACE*  BILIRUBINUR NEGATIVE NEGATIVE  KETONESUR NEGATIVE NEGATIVE  PROTEINUR NEGATIVE NEGATIVE  UROBILINOGEN 0.2 0.2  NITRITE NEGATIVE NEGATIVE  LEUKOCYTESUR SMALL* MODERATE*   Wet prep: few WBC, no yeast, trich, or clue cells  Signed: Lorenda Hatchet, MD 03/11/2015, 3:31 PM    Services Ordered on Discharge: none Equipment Ordered on Discharge: none

## 2015-03-11 NOTE — Discharge Instructions (Signed)
Please take the antibiotic ceftin tonight and then twice a day for five more days. You can take tylenol or ibuprofen over the counter for the pain. We have also prescribed you zofran to take every 8 hours as needed for nausea.

## 2015-03-11 NOTE — Progress Notes (Signed)
UR completed 

## 2015-03-11 NOTE — Progress Notes (Signed)
Subjective: Interview and exam conducted with [employer] assistance. Ms Trice feels improved this morning. She says her nausea and emesis have resolved and she is tolerating her diet well (she had spaghetti sitting at bedside). She says that her abdominal pain has largely resolved. She still has frequency but no dysuria. No vaginal discharge. Breast tenderness has resolved.  Objective: Vital signs in last 24 hours: Filed Vitals:   03/10/15 1213 03/10/15 1758 03/10/15 2216 03/11/15 0459  BP: 107/66  98/62 95/61  Pulse: 61  66 71  Temp: 98.8 F (37.1 C) 98.5 F (36.9 C) 98.1 F (36.7 C) 98.1 F (36.7 C)  TempSrc: Oral Oral Oral Oral  Resp: SpO2: 100% 98% 100% 100%   Weight change:   Intake/Output Summary (Last 24 hours) at 03/11/15 1144 Last data filed at 03/11/15 0910  Gross per 24 hour  Intake      0 ml  Output    200 ml  Net   -200 ml   Gen: A&O x 4, No acute distress, well developed, well nourished HEENT: Atraumatic, PERRL, EOMI, sclerae anicteric, moist mucous membranes Heart: Regular rate and rhythm, normal S1 S2, no murmurs, rubs, or gallops Lungs: Clear to auscultation bilaterally, respirations unlabored Abd: Soft, non-tender, non-distended, + bowel sounds, no hepatosplenomegaly Ext: No edema or cyanosis  Lab Results: Basic Metabolic Panel:  Recent Labs Lab 03/10/15 0440 03/11/15 0442  NA 137 140  K 3.7 3.6  CL 107 107  CO2 22 30  GLUCOSE 98 119*  BUN <5* <5*  CREATININE 0.59 0.68  CALCIUM 8.5 8.6   Liver Function Tests:  Recent Labs Lab 03/10/15 0440 03/11/15 0442  AST 24 23  ALT 12 13  ALKPHOS 70 66  BILITOT 0.3 0.3  PROT 6.7 6.5  ALBUMIN 3.4* 3.1*    Recent Labs Lab 03/07/15 0610 03/10/15 0440  LIPASE 29 29   CBC:  Recent Labs Lab 03/07/15 0610 03/10/15 0440 03/11/15 0442  WBC 13.4* 9.8 7.1  NEUTROABS 8.2* 4.0  --   HGB 13.1 13.0 12.5  HCT 40.1 40.1 39.0  MCV 88.1 87.4 88.2  PLT 209 247 236    Urinalysis:  Recent Labs Lab 03/07/15 0645 03/10/15 0456  COLORURINE YELLOW AMBER*  LABSPEC 1.036* 1.024  PHURINE 6.0 6.0  GLUCOSEU NEGATIVE NEGATIVE  HGBUR MODERATE* TRACE*  BILIRUBINUR NEGATIVE NEGATIVE  KETONESUR NEGATIVE NEGATIVE  PROTEINUR NEGATIVE NEGATIVE  UROBILINOGEN 0.2 0.2  NITRITE NEGATIVE NEGATIVE  LEUKOCYTESUR SMALL* MODERATE*    Micro Results: Recent Results (from the past 240 hour(s))  Wet prep, genital     Status: Abnormal   Collection Time: 03/07/15 12:31 PM  Result Value Ref Range Status   Yeast Wet Prep HPF POC NONE SEEN NONE SEEN Final   Trich, Wet Prep NONE SEEN NONE SEEN Final   Clue Cells Wet Prep HPF POC MODERATE (A) NONE SEEN Final   WBC, Wet Prep HPF POC MODERATE (A) NONE SEEN Final  Wet prep, genital     Status: Abnormal   Collection Time: 03/10/15 10:45 AM  Result Value Ref Range Status   Yeast Wet Prep HPF POC NONE SEEN NONE SEEN Final   Trich, Wet Prep NONE SEEN NONE SEEN Final   Clue Cells Wet Prep HPF POC NONE SEEN NONE SEEN Final   WBC, Wet Prep HPF POC FEW (A) NONE SEEN Final   Studies/Results: US Transvaginal Non-ob  03/10/2015   CLINICAL DATA:  29 year old female with acute pelvic pain.  Left lower quadrant pain. Negative urine pregnancy test. IUD removed last month. Initial encounter.  EXAM: TRANSABDOMINAL AND TRANSVAGINAL ULTRASOUND OF PELVIS  TECHNIQUE: Both transabdominal and transvaginal ultrasound examinations of the pelvis were performed. Transabdominal technique was performed for global imaging of the pelvis including uterus, ovaries, adnexal regions, and pelvic cul-de-sac. It was necessary to proceed with endovaginal exam following the transabdominal exam to visualize the ovaries.  COMPARISON:  CT Abdomen and Pelvis 03/07/2015  FINDINGS: Uterus  Measurements: 7.5 x 4.1 x 4.3 cm. Retroflexed. No fibroids or other mass visualized.  Endometrium  Thickness: 4 mm.  No focal abnormality visualized.  Right ovary  Measurements: 2.3 x 3.1  x 2.6 cm. Oval 15 mm hypoechoic area with mildly thickened wall compatible with corpus luteum. Normal appearance/no adnexal mass.  Left ovary  Measurements: 1.9 x 2.6 x 1.9 cm. Multiple small follicles. Normal appearance/no adnexal mass.  Other findings  Trace simple appearing pelvic free fluid  IMPRESSION: Normal sonographic appearance of the pelvis.   Electronically Signed   By: Odessa Fleming M.D.   On: 03/10/2015 07:52   US Pelvis Complete  03/10/2015   CLINICAL DATA:  29 year old female with acute pelvic pain. Left lower quadrant pain. Negative urine pregnancy test. IUD removed last month. Initial encounter.  EXAM: TRANSABDOMINAL AND TRANSVAGINAL ULTRASOUND OF PELVIS  TECHNIQUE: Both transabdominal and transvaginal ultrasound examinations of the pelvis were performed. Transabdominal technique was performed for global imaging of the pelvis including uterus, ovaries, adnexal regions, and pelvic cul-de-sac. It was necessary to proceed with endovaginal exam following the transabdominal exam to visualize the ovaries.  COMPARISON:  CT Abdomen and Pelvis 03/07/2015  FINDINGS: Uterus  Measurements: 7.5 x 4.1 x 4.3 cm. Retroflexed. No fibroids or other mass visualized.  Endometrium  Thickness: 4 mm.  No focal abnormality visualized.  Right ovary  Measurements: 2.3 x 3.1 x 2.6 cm. Oval 15 mm hypoechoic area with mildly thickened wall compatible with corpus luteum. Normal appearance/no adnexal mass.  Left ovary  Measurements: 1.9 x 2.6 x 1.9 cm. Multiple small follicles. Normal appearance/no adnexal mass.  Other findings  Trace simple appearing pelvic free fluid  IMPRESSION: Normal sonographic appearance of the pelvis.   Electronically Signed   By: Odessa Fleming M.D.   On: 03/10/2015 07:52   Medications: I have reviewed the patient's current medications. Scheduled Meds: . enoxaparin (LOVENOX) injection  40 mg Subcutaneous Q24H   Continuous Infusions:  PRN Meds:.ketorolac, promethazine Assessment/Plan: Principal Problem:    Pelvic inflammatory disease (PID) Active Problems:   Nausea with vomiting  #UTI: Ms Bradt was treated with iv cefotan and doxycycline for suspected PID. However, I spoke with gynecology who feel this is not PID given no fever, no leukocytosis, pelvic u/s negative, and GC/chlamydia negative on 3/28. She did however have a UA with moderate leukocyte, few bacteria so she may have had UTI. She did have some mild CVA tenderness on R flank so pyleonephritis considered but kidney normal on CT and no fever, leukocytosis, etc. She is now tolerating po intake well and agreeable with discharge. -UA with UCx -d/c iv antibiotics and will discharge with po ciprofloxacin -phenergan 25 mg iv q4hprn -ketorolac 15 mg iv q6hprn  #Breast Tenderness: Ms Hope says her bilateral breast tenderness has resolved this morning. This was likely cyclical breast tenderness -anti-inflammatories as above   Dispo: Disposition is deferred at this time, awaiting improvement of current medical problems.  Anticipated discharge in approximately today.   The patient does have a current  PCP (No Pcp Per Patient) and [facility].  The patient does not know have transportation limitations that hinder transportation to clinic appointments.  .Services Needed at time of discharge: Y = Yes, Blank = No PT:   OT:   RN:   Equipment:   Other:     LOS: 1 day   Lorenda HatchetAdam L Fantasy Donald, MD 03/11/2015, 11:44 AM

## 2015-03-11 NOTE — Progress Notes (Signed)
D/c to home with family. D/c instructions given to patient and husband and demonstrated understanding. VSS breathing regular and unlabored on room air pain denied. UA sent and Dr Valentino Saxonothman states patient can proceed to d/c before results are given

## 2015-03-12 LAB — URINE CULTURE
Colony Count: NO GROWTH
Culture: NO GROWTH

## 2015-03-25 NOTE — ED Provider Notes (Signed)
CSN: [id number]     Arrival date & time 03/10/15  0406 History   First MD Initiated Contact with Patient 03/10/15 0440     Chief Complaint  Patient presents with  . Abdominal Pain  . Emesis  . Breast Pain     (Consider location/radiation/quality/duration/timing/severity/associated sxs/prior Treatment) HPI Comments: Pt is a 29 y.o. female with Pmhx as above who presents with what is at first described as 2 weeks of constant low ab pain, but on further questioning appears to have been present for months and is being evaluated for an ex-lap. She has had assoc n/v, constipation. She was seen for same several days ago, had nml CT ab/pelvis.  She was placed on flagyl for BV.  Patient is a 29 y.o. female presenting with abdominal pain and vomiting. The history is provided by the patient. No language interpreter was used.  Abdominal Pain Pain location:  LLQ and RLQ Pain quality: aching and sharp   Pain radiates to:  Does not radiate Pain severity:  Severe Onset quality:  Unable to specify Duration: several months. Timing:  Constant Progression:  Waxing and waning Chronicity:  Chronic Context: not diet changes, not recent travel, not sick contacts, not suspicious food intake and not trauma   Relieved by:  Nothing Worsened by:  Nothing tried Ineffective treatments:  None tried Associated symptoms: anorexia, constipation, nausea and vomiting   Associated symptoms: no chest pain, no chills, no cough, no diarrhea, no dysuria, no fatigue, no fever, no shortness of breath and no sore throat   Emesis Associated symptoms: abdominal pain   Associated symptoms: no arthralgias, no chills, no diarrhea, no headaches and no sore throat     Past Medical History  Diagnosis Date  . PID (pelvic inflammatory disease)    Past Surgical History  Procedure Laterality Date  . Cesarean section     History reviewed. No pertinent family history. History  Substance Use Topics  . Smoking status: Never  Smoker   . Smokeless tobacco: Never Used  . Alcohol Use: No   OB History    No data available     Review of Systems  Constitutional: Negative for fever, chills, diaphoresis, activity change, appetite change and fatigue.  HENT: Negative for congestion, facial swelling, rhinorrhea and sore throat.   Eyes: Negative for photophobia and discharge.  Respiratory: Negative for cough, chest tightness and shortness of breath.   Cardiovascular: Negative for chest pain, palpitations and leg swelling.  Gastrointestinal: Positive for nausea, vomiting, abdominal pain, constipation and anorexia. Negative for diarrhea.  Endocrine: Negative for polydipsia and polyuria.  Genitourinary: Negative for dysuria, frequency, difficulty urinating and pelvic pain.  Musculoskeletal: Negative for back pain, arthralgias, neck pain and neck stiffness.  Skin: Negative for color change and wound.  Allergic/Immunologic: Negative for immunocompromised state.  Neurological: Negative for facial asymmetry, weakness, numbness and headaches.  Hematological: Does not bruise/bleed easily.  Psychiatric/Behavioral: Negative for confusion and agitation.      Allergies  Review of patient's allergies indicates no known allergies.  Home Medications   Prior to Admission medications   Medication Sig Start Date End Date Taking? Authorizing Provider  dicyclomine (BENTYL) 20 MG tablet Take 1 tablet (20 mg total) by mouth 2 (two) times daily as needed for spasms (abdominal pain). 03/07/15  Yes Trixie Dredge, PA-C  drospirenone-ethinyl estradiol (YAZ,GIANVI,LORYNA) 3-0.02 MG tablet Take 1 tablet by mouth daily.   Yes Historical Provider, MD  ibuprofen (ADVIL,MOTRIN) 200 MG tablet Take 400-600 mg by mouth every 6 (  six) hours as needed for headache, mild pain or moderate pain.   Yes Historical Provider, MD  metoCLOPramide (REGLAN) 10 MG tablet Take 1 tablet (10 mg total) by mouth every 6 (six) hours as needed for nausea or vomiting. 03/07/15   Yes Trixie Dredge, PA-C  cefUROXime (CEFTIN) 250 MG tablet Take 1 tablet (250 mg total) by mouth 2 (two) times daily with a meal. 03/11/15   Lorenda Hatchet, MD  ondansetron (ZOFRAN ODT) 4 MG disintegrating tablet Take 1 tablet (4 mg total) by mouth every 8 (eight) hours as needed. 03/11/15   Lorenda Hatchet, MD   BP 105/69 mmHg  Pulse 74  Temp(Src) 98.2 F (36.8 C) (Oral)  Resp 18  SpO2 100%  LMP 01/30/2015 Physical Exam  Constitutional: She is oriented to person, place, and time. She appears well-developed and well-nourished. No distress.  HENT:  Head: Normocephalic and atraumatic.  Mouth/Throat: No oropharyngeal exudate.  Eyes: Pupils are equal, round, and reactive to light.  Neck: Normal range of motion. Neck supple.  Cardiovascular: Normal rate, regular rhythm and normal heart sounds.  Exam reveals no gallop and no friction rub.   No murmur heard. Pulmonary/Chest: Effort normal and breath sounds normal. No respiratory distress. She has no wheezes. She has no rales.  Abdominal: Soft. Bowel sounds are normal. She exhibits no distension and no mass. There is generalized tenderness. There is no rigidity, no rebound and no guarding.  Musculoskeletal: Normal range of motion. She exhibits no edema or tenderness.  Neurological: She is alert and oriented to person, place, and time.  Skin: Skin is warm and dry.  Psychiatric: She has a normal mood and affect.    ED Course  Procedures (including critical care time) Labs Review Labs Reviewed  WET PREP, GENITAL - Abnormal; Notable for the following:    WBC, Wet Prep HPF POC FEW (*)    All other components within normal limits  CBC WITH DIFFERENTIAL/PLATELET - Abnormal; Notable for the following:    Neutrophils Relative % 41 (*)    Lymphocytes Relative 48 (*)    Lymphs Abs 4.8 (*)    All other components within normal limits  COMPREHENSIVE METABOLIC PANEL - Abnormal; Notable for the following:    BUN <5 (*)    Albumin 3.4 (*)    All other  components within normal limits  URINALYSIS, ROUTINE W REFLEX MICROSCOPIC - Abnormal; Notable for the following:    Color, Urine AMBER (*)    APPearance CLOUDY (*)    Hgb urine dipstick TRACE (*)    Leukocytes, UA MODERATE (*)    All other components within normal limits  URINE MICROSCOPIC-ADD ON - Abnormal; Notable for the following:    Squamous Epithelial / LPF MANY (*)    Bacteria, UA FEW (*)    All other components within normal limits  COMPREHENSIVE METABOLIC PANEL - Abnormal; Notable for the following:    Glucose, Bld 119 (*)    BUN <5 (*)    Albumin 3.1 (*)    Anion gap 3 (*)    All other components within normal limits  URINALYSIS, ROUTINE W REFLEX MICROSCOPIC - Abnormal; Notable for the following:    Leukocytes, UA TRACE (*)    All other components within normal limits  URINE MICROSCOPIC-ADD ON - Abnormal; Notable for the following:    Squamous Epithelial / LPF MANY (*)    All other components within normal limits  URINE CULTURE  LIPASE, BLOOD  CBC  POC URINE  PREG, ED  CERVICOVAGINAL ANCILLARY ONLY    Imaging Review No results found.   EKG Interpretation   Date/Time:  Thursday March 10 2015 04:19:43 EDT Ventricular Rate:  84 PR Interval:  149 QRS Duration: 96 QT Interval:  364 QTC Calculation: 430 R Axis:   29 Text Interpretation:  Age not entered, assumed to be  29 years old for  purpose of ECG interpretation Sinus rhythm Abnormal R-wave progression,  early transition Baseline wander in lead(s) V6 Confirmed by Sandria Mcenroe  MD,  Donnisha Besecker ([id number]) on 03/10/2015 4:37:56 AM      MDM   Final diagnoses:  Intractable vomiting with nausea, vomiting of unspecified type    Pt is a 29 y.o. female with Pmhx as above who presents with what is at first described as 2 weeks of constant low ab pain, but on further questioning appears to have been present for months and is being evaluated for an ex-lap. She has had assoc n/v, constipation. She was seen for same several days  ago, had nml CT ab/pelvis.  She was placed on flagyl for BV. On PE, VSS, pt uncomfortable but in NAD. She has generalized abdominal tenderness, W/o rebound or guarding [facility] I do not feel repeat CT imaging will be helpful, will get pelvic US and will have imaging f/u by Dr. Gwendolyn GrantWalden. I doubt acute surgical issue given timing of symptoms and recent nml CT.       Toy CookeyMegan Dontavius Keim, MD 03/26/15 [phone]

## 2015-03-29 ENCOUNTER — Emergency Department (HOSPITAL_COMMUNITY)
Admission: EM | Admit: 2015-03-29 | Discharge: 2015-03-30 | Disposition: A | Discharge status: Home / Self Care | Payer: BLUE CROSS/BLUE SHIELD | Attending: Emergency Medicine | Admitting: Emergency Medicine

## 2015-03-29 ENCOUNTER — Encounter (HOSPITAL_COMMUNITY): Payer: Self-pay | Admitting: Emergency Medicine

## 2015-03-29 DIAGNOSIS — Z79899 Other long term (current) drug therapy: Secondary | ICD-10-CM | POA: Diagnosis not present

## 2015-03-29 DIAGNOSIS — R1084 Generalized abdominal pain: Secondary | ICD-10-CM

## 2015-03-29 DIAGNOSIS — Z792 Long term (current) use of antibiotics: Secondary | ICD-10-CM | POA: Diagnosis not present

## 2015-03-29 DIAGNOSIS — G8929 Other chronic pain: Secondary | ICD-10-CM | POA: Diagnosis not present

## 2015-03-29 DIAGNOSIS — Z3202 Encounter for pregnancy test, result negative: Secondary | ICD-10-CM | POA: Diagnosis not present

## 2015-03-29 DIAGNOSIS — Z8742 Personal history of other diseases of the female genital tract: Secondary | ICD-10-CM | POA: Diagnosis not present

## 2015-03-29 DIAGNOSIS — R112 Nausea with vomiting, unspecified: Secondary | ICD-10-CM

## 2015-03-29 DIAGNOSIS — R51 Headache: Secondary | ICD-10-CM | POA: Diagnosis not present

## 2015-03-29 LAB — URINALYSIS, ROUTINE W REFLEX MICROSCOPIC
Bilirubin Urine: NEGATIVE
Glucose, UA: NEGATIVE mg/dL
KETONES UR: NEGATIVE mg/dL
Leukocytes, UA: NEGATIVE
Nitrite: NEGATIVE
PH: 6.5 (ref 5.0–8.0)
PROTEIN: NEGATIVE mg/dL
Specific Gravity, Urine: 1.012 (ref 1.005–1.030)
Urobilinogen, UA: 0.2 mg/dL (ref 0.0–1.0)

## 2015-03-29 LAB — CBC WITH DIFFERENTIAL/PLATELET
BASOS PCT: 0 % (ref 0–1)
Basophils Absolute: 0 10*3/uL (ref 0.0–0.1)
Eosinophils Absolute: 0.4 10*3/uL (ref 0.0–0.7)
Eosinophils Relative: 4 % (ref 0–5)
HEMATOCRIT: 42.1 % (ref 36.0–46.0)
HEMOGLOBIN: 13.6 g/dL (ref 12.0–15.0)
LYMPHS PCT: 51 % — AB (ref 12–46)
Lymphs Abs: 4.6 10*3/uL — ABNORMAL HIGH (ref 0.7–4.0)
MCH: 28.1 pg (ref 26.0–34.0)
MCHC: 32.3 g/dL (ref 30.0–36.0)
MCV: 87 fL (ref 78.0–100.0)
MONO ABS: 0.8 10*3/uL (ref 0.1–1.0)
MONOS PCT: 9 % (ref 3–12)
Neutro Abs: 3.2 10*3/uL (ref 1.7–7.7)
Neutrophils Relative %: 36 % — ABNORMAL LOW (ref 43–77)
Platelets: 314 10*3/uL (ref 150–400)
RBC: 4.84 MIL/uL (ref 3.87–5.11)
RDW: 13.2 % (ref 11.5–15.5)
WBC: 9 10*3/uL (ref 4.0–10.5)

## 2015-03-29 LAB — POC URINE PREG, ED: PREG TEST UR: NEGATIVE

## 2015-03-29 LAB — URINE MICROSCOPIC-ADD ON

## 2015-03-29 LAB — I-STAT CHEM 8, ED
BUN: 6 mg/dL (ref 6–23)
CHLORIDE: 102 mmol/L (ref 96–112)
Calcium, Ion: 1.15 mmol/L (ref 1.12–1.23)
Creatinine, Ser: 0.6 mg/dL (ref 0.50–1.10)
Glucose, Bld: 96 mg/dL (ref 70–99)
HCT: 42 % (ref 36.0–46.0)
Hemoglobin: 14.3 g/dL (ref 12.0–15.0)
Potassium: 3.7 mmol/L (ref 3.5–5.1)
Sodium: 140 mmol/L (ref 135–145)
TCO2: 21 mmol/L (ref 0–100)

## 2015-03-29 MED ORDER — ONDANSETRON 4 MG PO TBDP
4.0000 mg | ORAL_TABLET | Freq: Once | ORAL | Status: AC
  Administered 2015-03-30: 4 mg via ORAL
  Filled 2015-03-29: qty 1

## 2015-03-29 MED ORDER — KETOROLAC TROMETHAMINE 60 MG/2ML IM SOLN
60.0000 mg | Freq: Once | INTRAMUSCULAR | Status: AC
  Administered 2015-03-30: 60 mg via INTRAMUSCULAR
  Filled 2015-03-29: qty 2

## 2015-03-29 NOTE — ED Notes (Signed)
Patient with nausea, vomiting and headache.  Patient was here three weeks ago for the same symptoms.  Patient continues with the nausea and vomiting.

## 2015-03-29 NOTE — ED Provider Notes (Signed)
CSN: [id number]     Arrival date & time 03/29/15  2201 History   First MD Initiated Contact with Patient 03/29/15 2302     Chief Complaint  Patient presents with  . Emesis     (Consider location/radiation/quality/duration/timing/severity/associated sxs/prior Treatment) HPI  Nancy Oconnor is a 29 y.o. female with PMH of PID presenting with 2-3 days of nausea, vomiting as well as headache that developed gradually. Its like other headaches she has had before. Pt with generalized abdominal pain worse with eating. Pain is sharp and stabbing, it waxes and wanes. Pain is chronic.  Patient states she is vomited 4 times today and is nonbloody nonbilious. She has not taken anything for her symptoms. She states she has had subjective fevers but is afebrile in the ED. Patient evaluated 1 month ago with unremarkable CT, sound and was admitted for intractable nausea vomiting and discharge. Patient abdominal surgeries include cesarean. No visual changes, slurred speech, focal weakness. Pt endorses dysuria.   Past Medical History  Diagnosis Date  . PID (pelvic inflammatory disease)    Past Surgical History  Procedure Laterality Date  . Cesarean section     No family history on file. History  Substance Use Topics  . Smoking status: Never Smoker   . Smokeless tobacco: Never Used  . Alcohol Use: No   OB History    No data available     Review of Systems 10 Systems reviewed and are negative for acute change except as noted in the HPI.    Allergies  Review of patient's allergies indicates no known allergies.  Home Medications   Prior to Admission medications   Medication Sig Start Date End Date Taking? Authorizing Provider  doxycycline (VIBRAMYCIN) 100 MG capsule Take 100 mg by mouth 2 (two) times daily.   Yes Historical Provider, MD  hydrOXYzine (VISTARIL) 25 MG capsule Take 25 mg by mouth every 8 (eight) hours as needed for itching.   Yes Historical Provider, MD  ibuprofen (ADVIL,MOTRIN)  200 MG tablet Take 400-600 mg by mouth every 6 (six) hours as needed for headache, mild pain or moderate pain.   Yes Historical Provider, MD  cefUROXime (CEFTIN) 250 MG tablet Take 1 tablet (250 mg total) by mouth 2 (two) times daily with a meal. Patient not taking: Reported on 03/30/2015 03/11/15   Lorenda HatchetAdam L Rothman, MD  dicyclomine (BENTYL) 20 MG tablet Take 1 tablet (20 mg total) by mouth 2 (two) times daily. 03/30/15   Oswaldo ConroyVictoria Yasmin Dibello, PA-C  drospirenone-ethinyl estradiol (YAZ,GIANVI,LORYNA) 3-0.02 MG tablet Take 1 tablet by mouth daily.    Historical Provider, MD  metoCLOPramide (REGLAN) 10 MG tablet Take 1 tablet (10 mg total) by mouth every 6 (six) hours as needed for nausea or vomiting. Patient not taking: Reported on 03/30/2015 03/07/15   Trixie DredgeEmily West, PA-C  ondansetron (ZOFRAN ODT) 4 MG disintegrating tablet Take 1 tablet (4 mg total) by mouth every 8 (eight) hours as needed. Patient not taking: Reported on 03/30/2015 03/11/15   Lorenda HatchetAdam L Rothman, MD   BP 113/86 mmHg  Pulse 66  Temp(Src) 98 F (36.7 C) (Oral)  Resp 16  SpO2 100%  LMP 02/21/2015 (Exact Date) Physical Exam  Constitutional: She is oriented to person, place, and time. She appears well-developed and well-nourished. No distress.  HENT:  Head: Normocephalic and atraumatic.  Mouth/Throat: Oropharynx is clear and moist.  Eyes: Conjunctivae and EOM are normal. Pupils are equal, round, and reactive to light. Right eye exhibits no discharge. Left eye exhibits no  discharge.  Cardiovascular: Normal rate and regular rhythm.   Pulmonary/Chest: Effort normal and breath sounds normal. No respiratory distress. She has no wheezes.  Abdominal: Soft. Bowel sounds are normal. She exhibits no distension. There is no tenderness.  Neurological: She is alert and oriented to person, place, and time. No cranial nerve deficit. She exhibits normal muscle tone. Coordination normal.  Speech is clear and goal oriented.  Strength 5/5 in upper and lower  extremities. Sensation intact. Intact rapid alternating movements, finger to nose, and heel to shin. No pronator drift. Normal gait.  Skin: Skin is warm and dry. She is not diaphoretic.  Nursing note and vitals reviewed.   ED Course  Procedures (including critical care time) Labs Review Labs Reviewed  CBC WITH DIFFERENTIAL/PLATELET - Abnormal; Notable for the following:    Neutrophils Relative % 36 (*)    Lymphocytes Relative 51 (*)    Lymphs Abs 4.6 (*)    All other components within normal limits  URINALYSIS, ROUTINE W REFLEX MICROSCOPIC - Abnormal; Notable for the following:    Hgb urine dipstick TRACE (*)    All other components within normal limits  URINE MICROSCOPIC-ADD ON - Abnormal; Notable for the following:    Squamous Epithelial / LPF FEW (*)    Bacteria, UA FEW (*)    All other components within normal limits  URINE CULTURE  I-STAT CHEM 8, ED  POC URINE PREG, ED    Imaging Review Dg Abd Acute W/chest  03/30/2015   CLINICAL DATA:  Lower abdominal pain and vomiting for 3 days. History of pelvic inflammatory disease.  EXAM: DG ABDOMEN ACUTE W/ 1V CHEST  COMPARISON:  CT of the abdomen and pelvis March 07, 2015  FINDINGS: Bowel gas pattern is nondilated and nonobstructive. No intra-abdominal mass effect, pathologic calcifications or free air. Soft tissue planes and included osseous structures are nonsuspicious.  IMPRESSION: Normal chest.  Normal bowel gas pattern.   Electronically Signed   By: Awilda Metro   On: 03/30/2015 00:14     EKG Interpretation None      MDM   Final diagnoses:  Diffuse abdominal pain  Non-intractable vomiting with nausea, vomiting of unspecified type   Patient presenting with chronic abdominal pain with associated nausea and vomiting. Patient admitted 1 month ago for intractable nausea and vomiting with unremarkable CT and pelvic ultrasound. VSS. Abdominal exam with generalized tenderness without localizing symptoms. No evidence of  peritonitis. Patient with improvement of her symptoms with Toradol and Bentyl in ED. No active vomiting. Labwork reassuring. No evidence of UTI.  Patient tolerating fluids in the ED. I doubt acute abdominal process. Pt to follow up with gastroenterology. Script for bentyl provided.   Pt also with headache without focal neurological findings. I doubt ICH, SAH, meningitis. Pt with improvement in ED and stable for outpatient management.   Discussed return precautions with patient. Discussed all results and patient verbalizes understanding and agrees with plan.  Case has been discussed with Dr. Judd Lien who agrees with the above plan and to discharge.      Oswaldo Conroy, PA-C 03/30/15 0109  Geoffery Lyons, MD 03/30/15 [phone]

## 2015-03-30 ENCOUNTER — Emergency Department (HOSPITAL_COMMUNITY): Payer: BLUE CROSS/BLUE SHIELD

## 2015-03-30 MED ORDER — DICYCLOMINE HCL 20 MG PO TABS: 20.0000 mg | ORAL_TABLET | Freq: Two times a day (BID) | ORAL | Status: DC

## 2015-03-30 MED ORDER — DICYCLOMINE HCL 10 MG/ML IM SOLN
20.0000 mg | Freq: Once | INTRAMUSCULAR | Status: AC
  Administered 2015-03-30: 20 mg via INTRAMUSCULAR
  Filled 2015-03-30: qty 2

## 2015-03-30 NOTE — Discharge Instructions (Signed)
Return to the emergency room with worsening of symptoms, new symptoms or with symptoms that are concerning , especially fevers, abdominal pain in one area, unable to keep down fluids, blood in stool or vomit, severe pain, you feel faint, lightheaded or pass out. Please call your doctor for a followup appointment within 24-48 hours. When you talk to your doctor please let them know that you were seen in the emergency department and have them acquire all of your records so that they can discuss the findings with you and formulate a treatment plan to fully care for your new and ongoing problems.  Call to make appointment as soon as possible with a gastroenterologist. Number provided above. Read below information and follow recommendations.  Abdominal Pain, Women Abdominal (stomach, pelvic, or belly) pain can be caused by many things. It is important to tell your doctor:  The location of the pain.  Does it come and go or is it present all the time?  Are there things that start the pain (eating certain foods, exercise)?  Are there other symptoms associated with the pain (fever, nausea, vomiting, diarrhea)? All of this is helpful to know when trying to find the cause of the pain. CAUSES   Stomach: virus or bacteria infection, or ulcer.  Intestine: appendicitis (inflamed appendix), regional ileitis (Crohn's disease), ulcerative colitis (inflamed colon), irritable bowel syndrome, diverticulitis (inflamed diverticulum of the colon), or cancer of the stomach or intestine.  Gallbladder disease or stones in the gallbladder.  Kidney disease, kidney stones, or infection.  Pancreas infection or cancer.  Fibromyalgia (pain disorder).  Diseases of the female organs:  Uterus: fibroid (non-cancerous) tumors or infection.  Fallopian tubes: infection or tubal pregnancy.  Ovary: cysts or tumors.  Pelvic adhesions (scar tissue).  Endometriosis (uterus lining tissue growing in the pelvis and on the  pelvic organs).  Pelvic congestion syndrome (female organs filling up with blood just before the menstrual period).  Pain with the menstrual period.  Pain with ovulation (producing an egg).  Pain with an IUD (intrauterine device, birth control) in the uterus.  Cancer of the female organs.  Functional pain (pain not caused by a disease, may improve without treatment).  Psychological pain.  Depression. DIAGNOSIS  Your doctor will decide the seriousness of your pain by doing an examination.  Blood tests.  X-rays.  Ultrasound.  CT scan (computed tomography, special type of X-ray).  MRI (magnetic resonance imaging).  Cultures, for infection.  Barium enema (dye inserted in the large intestine, to better view it with X-rays).  Colonoscopy (looking in intestine with a lighted tube).  Laparoscopy (minor surgery, looking in abdomen with a lighted tube).  Major abdominal exploratory surgery (looking in abdomen with a large incision). TREATMENT  The treatment will depend on the cause of the pain.   Many cases can be observed and treated at home.  Over-the-counter medicines recommended by your caregiver.  Prescription medicine.  Antibiotics, for infection.  Birth control pills, for painful periods or for ovulation pain.  Hormone treatment, for endometriosis.  Nerve blocking injections.  Physical therapy.  Antidepressants.  Counseling with a psychologist or psychiatrist.  Minor or major surgery. HOME CARE INSTRUCTIONS   Do not take laxatives, unless directed by your caregiver.  Take over-the-counter pain medicine only if ordered by your caregiver. Do not take aspirin because it can cause an upset stomach or bleeding.  Try a clear liquid diet (broth or water) as ordered by your caregiver. Slowly move to a bland diet, as  tolerated, if the pain is related to the stomach or intestine.  Have a thermometer and take your temperature several times a day, and record  it.  Bed rest and sleep, if it helps the pain.  Avoid sexual intercourse, if it causes pain.  Avoid stressful situations.  Keep your follow-up appointments and tests, as your caregiver orders.  If the pain does not go away with medicine or surgery, you may try:  Acupuncture.  Relaxation exercises (yoga, meditation).  Group therapy.  Counseling. SEEK MEDICAL CARE IF:   You notice certain foods cause stomach pain.  Your home care treatment is not helping your pain.  You need stronger pain medicine.  You want your IUD removed.  You feel faint or lightheaded.  You develop nausea and vomiting.  You develop a rash.  You are having side effects or an allergy to your medicine. SEEK IMMEDIATE MEDICAL CARE IF:   Your pain does not go away or gets worse.  You have a fever.  Your pain is felt only in portions of the abdomen. The right side could possibly be appendicitis. The left lower portion of the abdomen could be colitis or diverticulitis.  You are passing blood in your stools (bright red or black tarry stools, with or without vomiting).  You have blood in your urine.  You develop chills, with or without a fever.  You pass out. MAKE SURE YOU:   Understand these instructions.  Will watch your condition.  Will get help right away if you are not doing well or get worse. Document Released: 09/23/2007 Document Revised: 04/12/2014 Document Reviewed: 10/13/2009 [facility] Patient Information 2015 [place], [place]. This information is not intended to replace advice given to you by your health care provider. Make sure you discuss any questions you have with your health care provider.

## 2015-03-31 LAB — URINE CULTURE
COLONY COUNT: NO GROWTH
CULTURE: NO GROWTH

## 2015-09-14 ENCOUNTER — Encounter (HOSPITAL_COMMUNITY): Payer: Self-pay | Admitting: Family Medicine

## 2015-09-14 ENCOUNTER — Emergency Department (HOSPITAL_COMMUNITY)
Admission: EM | Admit: 2015-09-14 | Discharge: 2015-09-14 | Disposition: A | Discharge status: Home / Self Care | Payer: BLUE CROSS/BLUE SHIELD | Source: Home / Self Care | Attending: Family Medicine | Admitting: Family Medicine

## 2015-09-14 DIAGNOSIS — G43009 Migraine without aura, not intractable, without status migrainosus: Secondary | ICD-10-CM

## 2015-09-14 MED ORDER — DEXAMETHASONE 4 MG PO TABS
ORAL_TABLET | ORAL | Status: AC
  Filled 2015-09-14: qty 2

## 2015-09-14 MED ORDER — DEXAMETHASONE 4 MG PO TABS
10.0000 mg | ORAL_TABLET | Freq: Once | ORAL | Status: AC
  Administered 2015-09-14: 10 mg via ORAL

## 2015-09-14 MED ORDER — ACETAMINOPHEN 325 MG PO TABS
975.0000 mg | ORAL_TABLET | Freq: Once | ORAL | Status: AC
  Administered 2015-09-14: 975 mg via ORAL

## 2015-09-14 MED ORDER — ONDANSETRON 4 MG PO TBDP
8.0000 mg | ORAL_TABLET | Freq: Once | ORAL | Status: AC
  Administered 2015-09-14: 8 mg via ORAL

## 2015-09-14 MED ORDER — DEXAMETHASONE 2 MG PO TABS
ORAL_TABLET | ORAL | Status: AC
  Filled 2015-09-14: qty 1

## 2015-09-14 MED ORDER — ACETAMINOPHEN 325 MG PO TABS
ORAL_TABLET | ORAL | Status: AC
  Filled 2015-09-14: qty 3

## 2015-09-14 MED ORDER — DIPHENHYDRAMINE HCL 25 MG PO CAPS
ORAL_CAPSULE | ORAL | Status: AC
  Filled 2015-09-14: qty 1

## 2015-09-14 MED ORDER — ONDANSETRON 4 MG PO TBDP
ORAL_TABLET | ORAL | Status: AC
  Filled 2015-09-14: qty 2

## 2015-09-14 MED ORDER — ONDANSETRON HCL 4 MG PO TABS: 4.0000 mg | ORAL_TABLET | Freq: Three times a day (TID) | ORAL | Status: DC | PRN

## 2015-09-14 MED ORDER — DIPHENHYDRAMINE HCL 25 MG PO CAPS
25.0000 mg | ORAL_CAPSULE | Freq: Once | ORAL | Status: AC
  Administered 2015-09-14: 25 mg via ORAL

## 2015-09-14 NOTE — ED Notes (Signed)
Generalized aching, chest soreness, headache, vomited yesterday.  Patient reports symptoms for 3 days.   Patient reports bee sting 4 days ago, right upper arm

## 2015-09-14 NOTE — ED Provider Notes (Signed)
CSN: [id number]     Arrival date & time 09/14/15  1818 History   First MD Initiated Contact with Patient 09/14/15 1912     Chief Complaint  Patient presents with  . Headache   (Consider location/radiation/quality/duration/timing/severity/associated sxs/prior Treatment) HPI   HA: 2 days ago. Photophobia. Some intermittent vision loss. These are pts typical HA symtpoms. H/o migraines. Advil 400 mg w/o much imrpovement. Associated w/ nausea.  Symptoms are not better or worse onset. Denies fevers, abdominal pain, dysuria, urgency, back pain, neck stiffness, rhinorrhea, congestion.  Past Medical History  Diagnosis Date  . PID (pelvic inflammatory disease)    Past Surgical History  Procedure Laterality Date  . Cesarean section     Family History  Problem Relation Age of Onset  . Hypertension Mother   . Hypertension Father    Social History  Substance Use Topics  . Smoking status: Never Smoker   . Smokeless tobacco: Never Used  . Alcohol Use: No   OB History    No data available     Review of Systems Per HPI with all other pertinent systems negative.    Allergies  Review of patient's allergies indicates no known allergies.  Home Medications   Prior to Admission medications   Medication Sig Start Date End Date Taking? Authorizing Provider  hydrOXYzine (VISTARIL) 25 MG capsule Take 25 mg by mouth every 8 (eight) hours as needed for itching.    Historical Provider, MD  ibuprofen (ADVIL,MOTRIN) 200 MG tablet Take 400-600 mg by mouth every 6 (six) hours as needed for headache, mild pain or moderate pain.    Historical Provider, MD  ondansetron (ZOFRAN) 4 MG tablet Take 1 tablet (4 mg total) by mouth every 8 (eight) hours as needed for nausea or vomiting. 09/14/15   Ozella Rocks, MD   Meds Ordered and Administered this Visit   Medications  dexamethasone (DECADRON) tablet 10 mg (not administered)  ondansetron (ZOFRAN-ODT) disintegrating tablet 8 mg (not administered)   acetaminophen (TYLENOL) tablet 975 mg (not administered)  diphenhydrAMINE (BENADRYL) capsule 25 mg (not administered)    BP 115/81 mmHg  Pulse 90  Temp(Src) 97.3 F (36.3 C) (Oral)  Resp 16  SpO2 100%  LMP 09/07/2015 No data found.   Physical Exam Physical Exam  Constitutional: oriented to person, place, and time. appears well-developed and well-nourished. No distress.  HENT:  Head: Normocephalic and atraumatic.  Eyes: EOMI. PERRL.  Neck: Normal range of motion.  Cardiovascular: RRR, no m/r/g, 2+ distal pulses,  Pulmonary/Chest: Effort normal and breath sounds normal. No respiratory distress.  Abdominal: Soft. Bowel sounds are normal. NonTTP, no distension.  Musculoskeletal: Normal range of motion. Non ttp, no effusion.  Neurological: alert and oriented to person, place, and time.  Skin: Skin is warm. No rash noted. non diaphoretic.  Psychiatric: normal mood and affect. behavior is normal. Judgment and thought content normal.   ED Course  Procedures (including critical care time)  Labs Review Labs Reviewed - No data to display  Imaging Review No results found.   Visual Acuity Review  Right Eye Distance:   Left Eye Distance:   Bilateral Distance:    Right Eye Near:   Left Eye Near:    Bilateral Near:         MDM   1. 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 Patient given additional prescription for Zofran at home. Patient  go to the ED if worsens. Patient tolerated by son for any needed translation the patient speaks fairly good [place].    Ozella Rocks, MD 09/14/15 [phone]

## 2015-09-14 NOTE — Discharge Instructions (Signed)
Your symptoms are likely due to a migraine. Your given several medicines to help reverse your migraine. Please hold home and get lots of sleep. Please use Zofran for additional nausea. Please follow-up with your regular doctor to help treat your cough.

## 2016-04-09 ENCOUNTER — Inpatient Hospital Stay (HOSPITAL_COMMUNITY)
Admission: AD | Admit: 2016-04-09 | Discharge: 2016-04-09 | Disposition: A | Discharge status: Home / Self Care | Payer: No Typology Code available for payment source | Source: Ambulatory Visit | Attending: Family Medicine | Admitting: Family Medicine

## 2016-04-09 ENCOUNTER — Encounter (HOSPITAL_COMMUNITY): Payer: Self-pay | Admitting: *Deleted

## 2016-04-09 DIAGNOSIS — R1030 Lower abdominal pain, unspecified: Secondary | ICD-10-CM

## 2016-04-09 DIAGNOSIS — R109 Unspecified abdominal pain: Secondary | ICD-10-CM | POA: Insufficient documentation

## 2016-04-09 LAB — URINALYSIS, ROUTINE W REFLEX MICROSCOPIC
Bilirubin Urine: NEGATIVE
GLUCOSE, UA: NEGATIVE mg/dL
Ketones, ur: NEGATIVE mg/dL
Nitrite: NEGATIVE
PROTEIN: NEGATIVE mg/dL
Specific Gravity, Urine: 1.01 (ref 1.005–1.030)
pH: 5.5 (ref 5.0–8.0)

## 2016-04-09 LAB — URINE MICROSCOPIC-ADD ON

## 2016-04-09 LAB — POCT PREGNANCY, URINE: PREG TEST UR: NEGATIVE

## 2016-04-09 MED ORDER — ONDANSETRON 8 MG PO TBDP
8.0000 mg | ORAL_TABLET | Freq: Once | ORAL | Status: AC
  Administered 2016-04-09: 8 mg via ORAL
  Filled 2016-04-09: qty 1

## 2016-04-09 MED ORDER — OXYCODONE-ACETAMINOPHEN 5-325 MG PO TABS
1.0000 | ORAL_TABLET | Freq: Once | ORAL | Status: AC
  Administered 2016-04-09: 1 via ORAL
  Filled 2016-04-09: qty 1

## 2016-04-09 NOTE — MAU Provider Note (Signed)
History   G1P1 in with c/o abd pain at incision site and vaginal spotting, cramping and diarrhea. Just had OC change several months ago. Has not taken any meds for cramping.  CSN: [id number]  Arrival date & time 04/09/16  1438   First Provider Initiated Contact with Patient 04/09/16 1638      Chief Complaint  Patient presents with  . Post-op Problem    HPI  Past Medical History  Diagnosis Date  . PID (pelvic inflammatory disease)     Past Surgical History  Procedure Laterality Date  . Cesarean section      Family History  Problem Relation Age of Onset  . Hypertension Mother   . Hypertension Father     Social History  Substance Use Topics  . Smoking status: Never Smoker   . Smokeless tobacco: Never Used  . Alcohol Use: No    OB History    Gravida Para Term Preterm AB TAB SAB Ectopic Multiple Living   1 1 1              Review of Systems  Constitutional: Negative.   HENT: Negative.   Eyes: Negative.   Respiratory: Negative.   Cardiovascular: Negative.   Gastrointestinal: Positive for abdominal pain and diarrhea.  Endocrine: Negative.   Genitourinary: Negative.   Musculoskeletal: Negative.   Skin: Negative.   Allergic/Immunologic: Negative.   Neurological: Negative.   Hematological: Negative.   Psychiatric/Behavioral: Negative.     Allergies  Review of patient's allergies indicates no known allergies.  Home Medications  No current outpatient prescriptions on file.  BP 114/75 mmHg  Pulse 87  Temp(Src) 98 F (36.7 C) (Oral)  Resp 18  Wt 137 lb (62.143 kg)  Physical Exam  Constitutional: She is oriented to person, place, and time. She appears well-developed and well-nourished.  HENT:  Head: Normocephalic.  Eyes: Pupils are equal, round, and reactive to light.  Neck: Normal range of motion.  Cardiovascular: Normal rate, regular rhythm, normal heart sounds and intact distal pulses.   Pulmonary/Chest: Effort normal and breath sounds normal.   Abdominal: Soft. Bowel sounds are normal.  Genitourinary: Vagina normal and uterus normal.  Musculoskeletal: Normal range of motion.  Neurological: She is alert and oriented to person, place, and time. She has normal reflexes.  Skin: Skin is warm and dry.  Psychiatric: She has a normal mood and affect. Her behavior is normal. Judgment and thought content normal.    MAU Course  Procedures (including critical care time)  Labs Reviewed  URINALYSIS, ROUTINE W REFLEX MICROSCOPIC (NOT AT [facility])  POCT PREGNANCY, URINE   No results found.   No diagnosis found.    MDM  No active bleeding, abd soft and non tender, transverse scar well healed. Will manage pain and defer pt to provider for f/u.

## 2016-04-09 NOTE — MAU Note (Signed)
Pt used interpreter for explanation of pain, bleeding and follow up. Pt told to continue taking BC and bleeding may take longer to resolve. Pt has appointment with PCP in 2 days. Told to keep appt and follow up pain and bleeding with PCP. No questions or concerns at this time.

## 2016-04-09 NOTE — Discharge Instructions (Signed)

## 2016-04-09 NOTE — MAU Note (Signed)
C/S 8 years ago. Says she has pain in incision area. Vaginal bleeding that is not her period. Has had bleeding 3 times this month.

## 2016-04-22 ENCOUNTER — Emergency Department (HOSPITAL_COMMUNITY)
Admission: EM | Admit: 2016-04-22 | Discharge: 2016-04-22 | Disposition: A | Discharge status: Home / Self Care | Payer: Medicaid Other | Attending: Emergency Medicine | Admitting: Emergency Medicine

## 2016-04-22 ENCOUNTER — Emergency Department (HOSPITAL_COMMUNITY): Payer: Medicaid Other

## 2016-04-22 ENCOUNTER — Encounter (HOSPITAL_COMMUNITY): Payer: Self-pay | Admitting: *Deleted

## 2016-04-22 DIAGNOSIS — Z793 Long term (current) use of hormonal contraceptives: Secondary | ICD-10-CM | POA: Insufficient documentation

## 2016-04-22 DIAGNOSIS — R102 Pelvic and perineal pain: Secondary | ICD-10-CM | POA: Insufficient documentation

## 2016-04-22 DIAGNOSIS — R079 Chest pain, unspecified: Secondary | ICD-10-CM | POA: Insufficient documentation

## 2016-04-22 DIAGNOSIS — R05 Cough: Secondary | ICD-10-CM | POA: Insufficient documentation

## 2016-04-22 DIAGNOSIS — Z3202 Encounter for pregnancy test, result negative: Secondary | ICD-10-CM | POA: Insufficient documentation

## 2016-04-22 DIAGNOSIS — R1031 Right lower quadrant pain: Secondary | ICD-10-CM | POA: Insufficient documentation

## 2016-04-22 DIAGNOSIS — Z8742 Personal history of other diseases of the female genital tract: Secondary | ICD-10-CM | POA: Insufficient documentation

## 2016-04-22 DIAGNOSIS — R1032 Left lower quadrant pain: Secondary | ICD-10-CM

## 2016-04-22 LAB — URINE MICROSCOPIC-ADD ON

## 2016-04-22 LAB — COMPREHENSIVE METABOLIC PANEL
ALBUMIN: 3.4 g/dL — AB (ref 3.5–5.0)
ALK PHOS: 53 U/L (ref 38–126)
ALT: 12 U/L — AB (ref 14–54)
AST: 18 U/L (ref 15–41)
Anion gap: 11 (ref 5–15)
BUN: 5 mg/dL — ABNORMAL LOW (ref 6–20)
CALCIUM: 8.9 mg/dL (ref 8.9–10.3)
CO2: 24 mmol/L (ref 22–32)
CREATININE: 0.64 mg/dL (ref 0.44–1.00)
Chloride: 104 mmol/L (ref 101–111)
Glucose, Bld: 86 mg/dL (ref 65–99)
Potassium: 3.7 mmol/L (ref 3.5–5.1)
Sodium: 139 mmol/L (ref 135–145)
Total Bilirubin: 0.6 mg/dL (ref 0.3–1.2)
Total Protein: 6.6 g/dL (ref 6.5–8.1)

## 2016-04-22 LAB — CBC
HEMATOCRIT: 40.2 % (ref 36.0–46.0)
Hemoglobin: 12.9 g/dL (ref 12.0–15.0)
MCH: 28.8 pg (ref 26.0–34.0)
MCHC: 32.1 g/dL (ref 30.0–36.0)
MCV: 89.7 fL (ref 78.0–100.0)
PLATELETS: 255 10*3/uL (ref 150–400)
RBC: 4.48 MIL/uL (ref 3.87–5.11)
RDW: 13.3 % (ref 11.5–15.5)
WBC: 10.7 10*3/uL — ABNORMAL HIGH (ref 4.0–10.5)

## 2016-04-22 LAB — URINALYSIS, ROUTINE W REFLEX MICROSCOPIC
Bilirubin Urine: NEGATIVE
GLUCOSE, UA: NEGATIVE mg/dL
Ketones, ur: NEGATIVE mg/dL
Nitrite: NEGATIVE
PH: 6.5 (ref 5.0–8.0)
PROTEIN: NEGATIVE mg/dL
SPECIFIC GRAVITY, URINE: 1.02 (ref 1.005–1.030)

## 2016-04-22 LAB — WET PREP, GENITAL
CLUE CELLS WET PREP: NONE SEEN
Sperm: NONE SEEN
TRICH WET PREP: NONE SEEN
YEAST WET PREP: NONE SEEN

## 2016-04-22 LAB — POC URINE PREG, ED: Preg Test, Ur: NEGATIVE

## 2016-04-22 LAB — LIPASE, BLOOD: Lipase: 30 U/L (ref 11–51)

## 2016-04-22 MED ORDER — CIPROFLOXACIN HCL 500 MG PO TABS: 500.0000 mg | ORAL_TABLET | Freq: Two times a day (BID) | ORAL | Status: DC

## 2016-04-22 MED ORDER — IOPAMIDOL (ISOVUE-300) INJECTION 61%
INTRAVENOUS | Status: AC
  Administered 2016-04-22: 100 mL
  Filled 2016-04-22: qty 100

## 2016-04-22 MED ORDER — MORPHINE SULFATE (PF) 4 MG/ML IV SOLN
4.0000 mg | Freq: Once | INTRAVENOUS | Status: AC
  Administered 2016-04-22: 4 mg via INTRAVENOUS
  Filled 2016-04-22: qty 1

## 2016-04-22 MED ORDER — ONDANSETRON HCL 4 MG/2ML IJ SOLN
4.0000 mg | Freq: Once | INTRAMUSCULAR | Status: AC
  Administered 2016-04-22: 4 mg via INTRAVENOUS
  Filled 2016-04-22: qty 2

## 2016-04-22 MED ORDER — KETOROLAC TROMETHAMINE 30 MG/ML IJ SOLN
30.0000 mg | Freq: Once | INTRAMUSCULAR | Status: AC
  Administered 2016-04-22: 30 mg via INTRAVENOUS
  Filled 2016-04-22: qty 1

## 2016-04-22 MED ORDER — SODIUM CHLORIDE 0.9 % IV BOLUS (SEPSIS)
1000.0000 mL | Freq: Once | INTRAVENOUS | Status: AC
  Administered 2016-04-22: 1000 mL via INTRAVENOUS

## 2016-04-22 MED ORDER — PHENAZOPYRIDINE HCL 200 MG PO TABS: 200.0000 mg | ORAL_TABLET | Freq: Three times a day (TID) | ORAL | Status: DC

## 2016-04-22 NOTE — ED Provider Notes (Signed)
CSN: [id number]     Arrival date & time 04/22/16  0506 History   First MD Initiated Contact with Patient 04/22/16 [phone]     Chief Complaint  Patient presents with  . Abdominal Pain     (Consider location/radiation/quality/duration/timing/severity/associated sxs/prior Treatment) The history is provided by the patient and medical records.     Patient presents with abdominal pain.  Reports she has had pain in her c-section scar and the left flank.  She has had pain in this area before but significantly worse in the past 1 month.  Notes she has had three menstrual periods this month and admits to abnormal vaginal discharge.  Has fire-like pain in the vagina.  Is urinating frequently at night with mild dysuria.  Notes she has also had chest pain for the past 3 days with cough, also worse at night.  Has been taking ibuprofen 600mg  for the pain, but states this makes her sleepy.  Is also taking birth control pills for the abnormal vaginal bleeding, was seen recently for this.  Has pelvic US scheduled for 04/25/16.    Pt declined language interpreter.    Past Medical History  Diagnosis Date  . PID (pelvic inflammatory disease)    Past Surgical History  Procedure Laterality Date  . Cesarean section     Family History  Problem Relation Age of Onset  . Hypertension Mother   . Hypertension Father    Social History  Substance Use Topics  . Smoking status: Never Smoker   . Smokeless tobacco: Never Used  . Alcohol Use: No   OB History    Gravida Para Term Preterm AB TAB SAB Ectopic Multiple Living   1 1 1             Review of Systems  All other systems reviewed and are negative.     Allergies  Review of patient's allergies indicates no known allergies.  Home Medications   Prior to Admission medications   Medication Sig Start Date End Date Taking? Authorizing Provider  ibuprofen (ADVIL,MOTRIN) 200 MG tablet Take 600 mg by mouth every 6 (six) hours as needed for moderate pain.     Yes Historical Provider, MD  Norgestimate-Eth Estradiol (SPRINTEC 28 PO) Take 1 tablet by mouth at bedtime.   Yes Historical Provider, MD   BP 111/72 mmHg  Pulse 86  Temp(Src) 98.2 F (36.8 C) (Oral)  Resp 17  Ht 5' (1.524 m)  Wt 54.432 kg  BMI 23.44 kg/m2  SpO2 100%  LMP 03/18/2016 Physical Exam  Constitutional: She appears well-developed and well-nourished. No distress.  HENT:  Head: Normocephalic and atraumatic.  Neck: Neck supple.  Cardiovascular: Normal rate and regular rhythm.   Pulmonary/Chest: Effort normal and breath sounds normal. No respiratory distress. She has no wheezes. She has no rales.  Abdominal: Soft. Bowel sounds are normal. She exhibits no distension. There is tenderness in the right lower quadrant and left lower quadrant. There is CVA tenderness (bilateral ). There is no rebound and no guarding.  Genitourinary: Uterus is tender. Uterus is not enlarged. Cervix exhibits no motion tenderness. Right adnexum displays tenderness. Right adnexum displays no mass and no fullness. Left adnexum displays tenderness. Left adnexum displays no mass and no fullness. There is tenderness in the vagina. No erythema or bleeding in the vagina. No foreign body around the vagina. No signs of injury around the vagina. No vaginal discharge found.  Pelvic exam performed with chaperone present.  Pt with diffuse tenderness throughout exam but  no focal tenderness.  Small amount of cervical mucus, appears physiologic.    Neurological: She is alert.  Skin: She is not diaphoretic.  Nursing note and vitals reviewed.   ED Course  Procedures (including critical care time) Labs Review Labs Reviewed  WET PREP, GENITAL - Abnormal; Notable for the following:    WBC, Wet Prep HPF POC MANY (*)    All other components within normal limits  COMPREHENSIVE METABOLIC PANEL - Abnormal; Notable for the following:    BUN 5 (*)    Albumin 3.4 (*)    ALT 12 (*)    All other components within normal limits   CBC - Abnormal; Notable for the following:    WBC 10.7 (*)    All other components within normal limits  URINALYSIS, ROUTINE W REFLEX MICROSCOPIC (NOT AT [facility]) - Abnormal; Notable for the following:    Hgb urine dipstick SMALL (*)    Leukocytes, UA SMALL (*)    All other components within normal limits  URINE MICROSCOPIC-ADD ON - Abnormal; Notable for the following:    Squamous Epithelial / LPF 0-5 (*)    Bacteria, UA RARE (*)    All other components within normal limits  URINE CULTURE  LIPASE, BLOOD  RPR  HIV ANTIBODY (ROUTINE TESTING)  POC URINE PREG, ED  GC/CHLAMYDIA PROBE AMP ([place]) NOT AT [facility]    Imaging Review Dg Chest 2 View  04/22/2016  CLINICAL DATA:  Centralized chest pain and vomiting 3 days. EXAM: CHEST  2 VIEW COMPARISON:  03/29/2015 and 06/27/2014 FINDINGS: The heart size and mediastinal contours are within normal limits. Both lungs are clear. The visualized skeletal structures are unremarkable. IMPRESSION: No active cardiopulmonary disease. Electronically Signed   By: Elberta Fortis M.D.   On: 04/22/2016 07:02   Ct Abdomen Pelvis W Contrast  04/22/2016  CLINICAL DATA:  Lower pelvic pain beginning this morning with vaginal bleeding. Negative pregnancy test. EXAM: CT ABDOMEN AND PELVIS WITH CONTRAST TECHNIQUE: Multidetector CT imaging of the abdomen and pelvis was performed using the standard protocol following bolus administration of intravenous contrast. CONTRAST:  80mL ISOVUE-300 IOPAMIDOL (ISOVUE-300) INJECTION 61% COMPARISON:  02/27/2015 and 01/24/2011 FINDINGS: Lung bases are normal. Abdominal images demonstrate a normal liver, spleen, gallbladder, pancreas and adrenal glands. Kidneys are normal in size without hydronephrosis or nephrolithiasis. Ureters are within normal. Vascular structures are within normal. Mesentery is within normal. There is no free fluid or focal inflammatory change. The appendix is normal. Colon is unremarkable with mild fecal retention  throughout the colon. Small bowel is within normal. Pelvic images demonstrate the uterus just right of midline which is otherwise within normal. The ovaries, bladder and rectum are normal. Remaining bones and soft tissues are within normal. IMPRESSION: No acute findings in the abdomen/pelvis. Electronically Signed   By: Elberta Fortis M.D.   On: 04/22/2016 09:38   I have personally reviewed and evaluated these images and lab results as part of my medical decision-making.   EKG Interpretation None         MDM   Final diagnoses:  Left lower quadrant pain    Afebrile, nontoxic patient with ongoing lower abdominal pain for 1 month, has had this previously.  Pt describes prior episode of this citing admission to the hospital - at this time she references she was being treated for UTI.  Given abdominal pain and urinary symptoms will treat for UTI, urine culture sent. Workup otherwise unremarkable.  Pelvic exam clinically not concerning for PID.  Doubt ovarian torsion or deeper pelvic infection.  Labs remarkable for mild elevation of WBC count.  CT abd/pelvis negative.  Abdominal exam is nonsurgical.   D/C home with cipro, pyridium, gyn follow up as planned.  GYN is also her PCP  Discussed result, findings, treatment, and follow up  with patient.  Pt given return precautions.  Pt verbalizes understanding and agrees with plan.        Trixie Dredge, PA-C 04/22/16 1212  Lyndal Pulley, MD 04/22/16 [phone]

## 2016-04-22 NOTE — ED Notes (Signed)
Patient transported to CT 

## 2016-04-22 NOTE — ED Notes (Signed)
The pt is c/o abd pain for a long time  Vomiting tonight  lmp may 9th

## 2016-04-22 NOTE — ED Notes (Signed)
Pt. Given water for PO challenge. 

## 2016-04-22 NOTE — Discharge Instructions (Signed)
Read the information below.  Use the prescribed medication as directed.  Please discuss all new medications with your pharmacist.  You may return to the Emergency Department at any time for worsening condition or any new symptoms that concern you.  If you develop high fevers, worsening abdominal pain, uncontrolled vomiting, or are unable to tolerate fluids by mouth, return to the ER for a recheck.   ° ° °Abdominal Pain, Adult °Many things can cause abdominal pain. Usually, abdominal pain is not caused by a disease and will improve without treatment. It can often be observed and treated at home. Your health care provider will do a physical exam and possibly order blood tests and X-rays to help determine the seriousness of your pain. However, in many cases, more time must pass before a clear cause of the pain can be found. Before that point, your health care provider may not know if you need more testing or further treatment. °HOME CARE INSTRUCTIONS °Monitor your abdominal pain for any changes. The following actions may help to alleviate any discomfort you are experiencing: °· Only take over-the-counter or prescription medicines as directed by your health care provider. °· Do not take laxatives unless directed to do so by your health care provider. °· Try a clear liquid diet (broth, tea, or water) as directed by your health care provider. Slowly move to a bland diet as tolerated. °SEEK MEDICAL CARE IF: °· You have unexplained abdominal pain. °· You have abdominal pain associated with nausea or diarrhea. °· You have pain when you urinate or have a bowel movement. °· You experience abdominal pain that wakes you in the night. °· You have abdominal pain that is worsened or improved by eating food. °· You have abdominal pain that is worsened with eating fatty foods. °· You have a fever. °SEEK IMMEDIATE MEDICAL CARE IF: °· Your pain does not go away within 2 hours. °· You keep throwing up (vomiting). °· Your pain is felt  only in portions of the abdomen, such as the right side or the left lower portion of the abdomen. °· You pass bloody or black tarry stools. °MAKE SURE YOU: °· Understand these instructions. °· Will watch your condition. °· Will get help right away if you are not doing well or get worse. °  °This information is not intended to replace advice given to you by your health care provider. Make sure you discuss any questions you have with your health care provider. °  °Document Released: 09/05/2005 Document Revised: 08/17/2015 Document Reviewed: 08/05/2013 °Elsevier Interactive Patient Education ©2016 [employer] ° °

## 2016-04-23 LAB — RPR: RPR Ser Ql: NONREACTIVE

## 2016-04-23 LAB — GC/CHLAMYDIA PROBE AMP (~~LOC~~) NOT AT ARMC
CHLAMYDIA, DNA PROBE: NEGATIVE
Neisseria Gonorrhea: NEGATIVE

## 2016-04-23 LAB — URINE CULTURE: Culture: 2000 — AB

## 2016-04-23 LAB — HIV ANTIBODY (ROUTINE TESTING W REFLEX): HIV Screen 4th Generation wRfx: NONREACTIVE

## 2016-04-30 ENCOUNTER — Emergency Department (HOSPITAL_COMMUNITY): Payer: Self-pay

## 2016-04-30 ENCOUNTER — Emergency Department (HOSPITAL_COMMUNITY)
Admission: EM | Admit: 2016-04-30 | Discharge: 2016-04-30 | Disposition: A | Discharge status: Home / Self Care | Payer: Self-pay | Attending: Emergency Medicine | Admitting: Emergency Medicine

## 2016-04-30 ENCOUNTER — Other Ambulatory Visit: Payer: Self-pay

## 2016-04-30 ENCOUNTER — Encounter (HOSPITAL_COMMUNITY): Payer: Self-pay | Admitting: Emergency Medicine

## 2016-04-30 DIAGNOSIS — N730 Acute parametritis and pelvic cellulitis: Secondary | ICD-10-CM

## 2016-04-30 DIAGNOSIS — Z3202 Encounter for pregnancy test, result negative: Secondary | ICD-10-CM | POA: Insufficient documentation

## 2016-04-30 DIAGNOSIS — Z9889 Other specified postprocedural states: Secondary | ICD-10-CM | POA: Insufficient documentation

## 2016-04-30 DIAGNOSIS — Z792 Long term (current) use of antibiotics: Secondary | ICD-10-CM | POA: Insufficient documentation

## 2016-04-30 DIAGNOSIS — R112 Nausea with vomiting, unspecified: Secondary | ICD-10-CM | POA: Insufficient documentation

## 2016-04-30 DIAGNOSIS — Z79899 Other long term (current) drug therapy: Secondary | ICD-10-CM | POA: Insufficient documentation

## 2016-04-30 DIAGNOSIS — R52 Pain, unspecified: Secondary | ICD-10-CM

## 2016-04-30 LAB — COMPREHENSIVE METABOLIC PANEL
ALBUMIN: 3.6 g/dL (ref 3.5–5.0)
ALK PHOS: 52 U/L (ref 38–126)
ALT: 12 U/L — AB (ref 14–54)
AST: 20 U/L (ref 15–41)
Anion gap: 8 (ref 5–15)
BUN: 5 mg/dL — ABNORMAL LOW (ref 6–20)
CALCIUM: 8.9 mg/dL (ref 8.9–10.3)
CHLORIDE: 106 mmol/L (ref 101–111)
CO2: 24 mmol/L (ref 22–32)
CREATININE: 0.62 mg/dL (ref 0.44–1.00)
GFR calc Af Amer: >60 mL/min (ref >60)
GFR calc non Af Amer: >60 mL/min (ref >60)
GLUCOSE: 87 mg/dL (ref 65–99)
Potassium: 3.5 mmol/L (ref 3.5–5.1)
SODIUM: 138 mmol/L (ref 135–145)
Total Bilirubin: 0.5 mg/dL (ref 0.3–1.2)
Total Protein: 6.8 g/dL (ref 6.5–8.1)

## 2016-04-30 LAB — URINALYSIS, ROUTINE W REFLEX MICROSCOPIC
BILIRUBIN URINE: NEGATIVE
GLUCOSE, UA: NEGATIVE mg/dL
Ketones, ur: NEGATIVE mg/dL
Leukocytes, UA: NEGATIVE
Nitrite: NEGATIVE
PROTEIN: NEGATIVE mg/dL
SPECIFIC GRAVITY, URINE: 1.019 (ref 1.005–1.030)
pH: 6.5 (ref 5.0–8.0)

## 2016-04-30 LAB — WET PREP, GENITAL
Clue Cells Wet Prep HPF POC: NONE SEEN
Sperm: NONE SEEN
TRICH WET PREP: NONE SEEN
YEAST WET PREP: NONE SEEN

## 2016-04-30 LAB — CBC
HCT: 40.2 % (ref 36.0–46.0)
Hemoglobin: 12.4 g/dL (ref 12.0–15.0)
MCH: 28 pg (ref 26.0–34.0)
MCHC: 30.8 g/dL (ref 30.0–36.0)
MCV: 90.7 fL (ref 78.0–100.0)
PLATELETS: 260 10*3/uL (ref 150–400)
RBC: 4.43 MIL/uL (ref 3.87–5.11)
RDW: 13.2 % (ref 11.5–15.5)
WBC: 11.9 10*3/uL — ABNORMAL HIGH (ref 4.0–10.5)

## 2016-04-30 LAB — URINE MICROSCOPIC-ADD ON

## 2016-04-30 LAB — GC/CHLAMYDIA PROBE AMP (~~LOC~~) NOT AT ARMC
Chlamydia: NEGATIVE
Neisseria Gonorrhea: NEGATIVE

## 2016-04-30 LAB — POC URINE PREG, ED: Preg Test, Ur: NEGATIVE

## 2016-04-30 LAB — LIPASE, BLOOD: LIPASE: 32 U/L (ref 11–51)

## 2016-04-30 MED ORDER — LIDOCAINE HCL (PF) 1 % IJ SOLN
INTRAMUSCULAR | Status: AC
  Administered 2016-04-30: 5 mL
  Filled 2016-04-30: qty 5

## 2016-04-30 MED ORDER — SODIUM CHLORIDE 0.9 % IV BOLUS (SEPSIS)
1000.0000 mL | Freq: Once | INTRAVENOUS | Status: AC
  Administered 2016-04-30: 1000 mL via INTRAVENOUS

## 2016-04-30 MED ORDER — CEFTRIAXONE SODIUM 250 MG IJ SOLR
250.0000 mg | Freq: Once | INTRAMUSCULAR | Status: AC
  Administered 2016-04-30: 250 mg via INTRAMUSCULAR
  Filled 2016-04-30: qty 250

## 2016-04-30 MED ORDER — METRONIDAZOLE 500 MG PO TABS
500.0000 mg | ORAL_TABLET | Freq: Once | ORAL | Status: AC
  Administered 2016-04-30: 500 mg via ORAL
  Filled 2016-04-30: qty 1

## 2016-04-30 MED ORDER — TRAMADOL HCL 50 MG PO TABS: 50.0000 mg | ORAL_TABLET | Freq: Four times a day (QID) | ORAL | Status: DC | PRN

## 2016-04-30 MED ORDER — KETOROLAC TROMETHAMINE 30 MG/ML IJ SOLN
30.0000 mg | Freq: Once | INTRAMUSCULAR | Status: AC
  Administered 2016-04-30: 30 mg via INTRAVENOUS
  Filled 2016-04-30: qty 1

## 2016-04-30 MED ORDER — DOXYCYCLINE HYCLATE 100 MG PO TABS
100.0000 mg | ORAL_TABLET | Freq: Once | ORAL | Status: AC
  Administered 2016-04-30: 100 mg via ORAL
  Filled 2016-04-30: qty 1

## 2016-04-30 MED ORDER — DOXYCYCLINE HYCLATE 100 MG PO TABS: 100.0000 mg | ORAL_TABLET | Freq: Two times a day (BID) | ORAL | Status: DC

## 2016-04-30 MED ORDER — METRONIDAZOLE 500 MG PO TABS: 500.0000 mg | ORAL_TABLET | Freq: Two times a day (BID) | ORAL | Status: DC

## 2016-04-30 NOTE — ED Notes (Addendum)
Pt states she woke up this morning with severe pelvic pain, nausea, and vomiting.  Reports she has been seen for same over the last 2 months- history of PID.  Denies urinary complaints.

## 2016-04-30 NOTE — Discharge Instructions (Signed)
As discussed, your evaluation has resulted in a diagnosis of pelvic inflammatory disease.  With this disease it is very important that you follow-up with your gynecologist.  If you are having difficulty with obtaining appropriate follow-up care, please use the provided resources to obtain assistance from [facility].  AllstateCommunity Resource Guide Financial Assistance The United Ways 211 is a great source of information about community services available.  Access by dialing 2-1-1 from anywhere in [place], or by website -  PooledIncome.plwww.nc211.org.   Other Local Resources (Updated 12/2015)  Financial Assistance   Services    Phone Number and Address  [facility]  Low-cost medical care - 1st and 3rd Saturday of every month  Must not qualify for public or private insurance and must have limited income [phone] [address] [place], [place]    [place] Department of Social Services  Child care  Emergency assistance for housing and Kimberly-Clarkutilities  Food stamps  Medicaid [phone] [address] [place], [place] [address]   [facility]  Low-cost medical care for children, communicable diseases, sexually-transmitted diseases, immunizations, maternity care, womens health and family planning [phone] [address] [place], [place] [address]  [facility]   Medication assistance for [facility] [facility] [place] [place] residents  Must meet income requirements [phone] [address] [place], [place].    [facility]  Child care  Emergency assistance for housing and Kimberly-Clarkutilities  Food stamps  Medicaid [id number] [address] [place], [place] [address]  [facility]   Low-cost medical care,   Monday through Friday, 9 am to 6 pm.   Accepts Medicare/Medicaid, and self-pay [phone] [address] [place], [place] [address]  [facility]  Low-cost medical care - Monday through Friday, 8:30 am - 5:30 pm  Accepts Medicaid and self-pay [phone] [address] [place], [place] [address]   [facility]  Primary medical care, including for those with sickle cell disease  Accepts Medicare, Medicaid, insurance and self-pay [phone] [address] [place], [place]  [facility]   Primary medical care  Accepts Medicare, [place], insurance and self-pay [phone] [address] [place], [place] [address]   [facility] Department of Social Services  Child care  Emergency assistance for housing and Kimberly-Clarkutilities  Food stamps  Medicaid [phone] [place], [place] [address]  [facility] Health and [employer] Services  Child care  Emergency assistance for housing and Kimberly-Clarkutilities  Food stamps  Medicaid [id number] [address] [place], [place] [address]   [facility]  Medication assistance for [facility] residents with no insurance only  Must have a primary care doctor [phone] [address] [place], [place]  [facility]   Primary medical care  [place] Medicare, [place], insurance  [phone] [address] [place], [place]  [facility]   Medication assistance [phone]  [facility]   Primary medical care  Accepts Medicare, [place], insurance and self-pay [phone] [address] [place], [place] [address]  [facility]   Primary medical care  Accepts Medicare, [place], insurance and self-pay [phone] [address] [place], [place] [address]  [facility]  For [place] residents between the ages of 4618 and 7364 who do not have any form of health insurance, Medicare, [place], or [place] benefits.  Services are provided free of charge  to uninsured patients who fall within federal poverty guidelines.    Hours: Tuesdays and Thursdays, 4:15 - 8 pm [phone] [address] [place], [place] [address]  [facility]     Primary medical care  Dental care  Nutritional counseling  Pharmacy  Accepts Medicaid, Medicare, most insurance.  Fees are adjusted based on ability to pay.   [phone] [facility] [address] [place], [place]  [phone] [facility] [address] [place], [place]  [phone] [facility] [place], [place]  [phone] [facility], [address] [place], [place]  [phone] [facility] [address] [place], [place]  [facility]  Womens health and family planning [phone] [address] [place], [place]  [facility] Department of Social Services  Child care  Emergency assistance for housing and [employer]ark  Medicaid [phone] [address] [place], [place] [address]   [facility]    Ages 15 and older  Hours: Mondays and Thursdays, 7:00 am - 9:00 am Patients are seen on a first come, first served basis. [phone], [phone] [place], [place]  [facility] Division of Social Services  Child care  Emergency assistance for housing and [employer]ark  Medicaid [phone] [address] [place], [place] [address]  [facility]  Medication assistance  Rental assistance  Food pantry  Medication assistance  Housing assistance  Emergency food distribution  Utility assistance [phone] [address] [place], [place]  [phone]  [address] [place], [place] [address] Hours: Tuesdays and Thursdays from 9am - 12 noon by appointment only  [phone] [address] [place], [place] [address]  [facility] Lanae Boast   Accepts private insurance, [place], and [place].  Payment is based on a sliding scale for those without insurance.  Hours: Mondays, Tuesdays and Thursdays, 8:30 am - 5:30 pm.   [phone] [address] [place], [place]  [facility] - Family Medicine at [facility], [place], and [place].  Payment is based on a sliding scale for those without insurance. [phone] [address] [place], [place]  [facility] - Pediatrics at E. Scientist, research (physical sciences), [employer], and [place].  Payment is based on a sliding scale for those without insurance [phone] [address], [employer], [place]  [facility] - Pediatrics at [employer], [place], and [place].  Payment is based on a sliding scale for those without insurance. [phone] [address] [place], [place]  [facility] - Pediatrics at [facility], [place], and [place].  Payment is based on a sliding scale for those without insurance. [phone], [phone] [place], [place].    [facility]  Maternity care.  Accepts Medicaid and self-pay. [phone] [address] [place], [place]

## 2016-04-30 NOTE — ED Provider Notes (Signed)
CSN: [id number]     Arrival date & time 04/30/16  0457 History   First MD Initiated Contact with Patient 04/30/16 [phone]     Chief Complaint  Patient presents with  . Pelvic Pain     (Consider location/radiation/quality/duration/timing/severity/associated sxs/prior Treatment) HPI Patient presents with concern of ongoing lower abdominal pain, vaginal discharge, inconsistent vaginal bleeding. Symptoms present for 2 months, though the discharge seems new, over the past weeks. Patient was here 8 days ago for similar concerns, notes that since discharge she has been in similar condition, has not seen gynecology, as she has no insurance. She denies new fever, chills, states the pain is severe, bilateral, lower No improvement with OTC medication or anything. There is associated nausea, vomiting. Patient's last pregnancy was 8 years ago, delivered via C-section.  Past Medical History  Diagnosis Date  . PID (pelvic inflammatory disease)    Past Surgical History  Procedure Laterality Date  . Cesarean section     Family History  Problem Relation Age of Onset  . Hypertension Mother   . Hypertension Father    Social History  Substance Use Topics  . Smoking status: Never Smoker   . Smokeless tobacco: Never Used  . Alcohol Use: No   OB History    Gravida Para Term Preterm AB TAB SAB Ectopic Multiple Living   1 1 1             Review of Systems  Constitutional:       Per HPI, otherwise negative  HENT:       Per HPI, otherwise negative  Respiratory:       Per HPI, otherwise negative  Cardiovascular:       Per HPI, otherwise negative  Gastrointestinal: Positive for vomiting.  Endocrine:       Negative aside from HPI  Genitourinary:       Neg aside from HPI   Musculoskeletal:       Per HPI, otherwise negative  Skin: Negative.   Neurological: Negative for syncope.      Allergies  Review of patient's allergies indicates no known allergies.  Home Medications   Prior to  Admission medications   Medication Sig Start Date End Date Taking? Authorizing Provider  ciprofloxacin (CIPRO) 500 MG tablet Take 1 tablet (500 mg total) by mouth 2 (two) times daily. 04/22/16  Yes Trixie DredgeEmily West, PA-C  phenazopyridine (PYRIDIUM) 200 MG tablet Take 1 tablet (200 mg total) by mouth 3 (three) times daily. 04/22/16  Yes Emily West, PA-C   BP 104/78 mmHg  Pulse 71  Resp 16  SpO2 100%  LMP 04/12/2016 Physical Exam  Constitutional: She is oriented to person, place, and time. She appears well-developed and well-nourished. No distress.  HENT:  Head: Normocephalic and atraumatic.  Eyes: Conjunctivae and EOM are normal.  Cardiovascular: Normal rate and regular rhythm.   Pulmonary/Chest: Effort normal and breath sounds normal. No stridor. No respiratory distress.  Abdominal: She exhibits no distension.    Genitourinary: There is no rash on the right labia. There is no rash on the left labia. Cervix exhibits motion tenderness and discharge. Right adnexum displays tenderness. Left adnexum displays tenderness. There is tenderness in the vagina. No bleeding in the vagina. No foreign body around the vagina. Vaginal discharge found.  Musculoskeletal: She exhibits no edema.  Neurological: She is alert and oriented to person, place, and time. No cranial nerve deficit.  Skin: Skin is warm and dry.  Psychiatric: She has a normal mood and affect.  Nursing note and vitals reviewed.   ED Course  Pelvic exam Date/Time: 04/30/2016 7:45 AM Performed by: Gerhard Munch Authorized by: Gerhard Munch Consent: Verbal consent obtained. Risks and benefits: risks, benefits and alternatives were discussed Consent given by: patient Patient understanding: patient states understanding of the procedure being performed Patient consent: the patient's understanding of the procedure matches consent given Procedure consent: procedure consent matches procedure scheduled Relevant documents: relevant documents  present and verified Test results: test results available and properly labeled Site marked: the operative site was marked Imaging studies: imaging studies available Required items: required blood products, implants, devices, and special equipment available Patient identity confirmed: verbally with patient Time out: Immediately prior to procedure a "time out" was called to verify the correct patient, procedure, equipment, support staff and site/side marked as required. Preparation: Patient was prepped and draped in the usual sterile fashion. Local anesthesia used: no Patient sedated: no Patient tolerance: Patient tolerated the procedure well with no immediate complications   (including critical care time) Labs Review Labs Reviewed  WET PREP, GENITAL - Abnormal; Notable for the following:    WBC, Wet Prep HPF POC MANY (*)    All other components within normal limits  COMPREHENSIVE METABOLIC PANEL - Abnormal; Notable for the following:    BUN 5 (*)    ALT 12 (*)    All other components within normal limits  CBC - Abnormal; Notable for the following:    WBC 11.9 (*)    All other components within normal limits  URINALYSIS, ROUTINE W REFLEX MICROSCOPIC (NOT AT [facility]) - Abnormal; Notable for the following:    APPearance CLOUDY (*)    Hgb urine dipstick SMALL (*)    All other components within normal limits  URINE MICROSCOPIC-ADD ON - Abnormal; Notable for the following:    Squamous Epithelial / LPF 6-30 (*)    Bacteria, UA RARE (*)    All other components within normal limits  LIPASE, BLOOD  POC URINE PREG, ED  GC/CHLAMYDIA PROBE AMP ([place]) NOT AT [facility]    Imaging Review US Transvaginal Non-ob  04/30/2016  CLINICAL DATA:  Pelvic inflammatory disease, pain. EXAM: TRANSABDOMINAL AND TRANSVAGINAL ULTRASOUND OF PELVIS TECHNIQUE: Both transabdominal and transvaginal ultrasound examinations of the pelvis were performed. Transabdominal technique was performed for global imaging of the  pelvis including uterus, ovaries, adnexal regions, and pelvic cul-de-sac. It was necessary to proceed with endovaginal exam following the transabdominal exam to visualize the uterus, endometrium, ovaries and adnexal regions. COMPARISON:  CT and pelvis 04/22/2016 and pelvic ultrasound 03/10/2015. FINDINGS: Uterus Measurements: 6.3 x 3.7 x 4.6 cm. No fibroids or other mass visualized. Endometrium Thickness: 4 mm, within normal limits. No focal abnormality visualized. Right ovary Measurements: 2.5 x 1.5 x 2.9 cm. Normal appearance/no adnexal mass. Left ovary Measurements: 2.8 x 1.4 x 1.7 cm. Normal appearance/no adnexal mass. Other findings No abnormal free fluid. IMPRESSION: Normal exam. Electronically Signed   By: Leanna Battles M.D.   On: 04/30/2016 09:16   US Pelvis Complete  04/30/2016  CLINICAL DATA:  Pelvic inflammatory disease, pain. EXAM: TRANSABDOMINAL AND TRANSVAGINAL ULTRASOUND OF PELVIS TECHNIQUE: Both transabdominal and transvaginal ultrasound examinations of the pelvis were performed. Transabdominal technique was performed for global imaging of the pelvis including uterus, ovaries, adnexal regions, and pelvic cul-de-sac. It was necessary to proceed with endovaginal exam following the transabdominal exam to visualize the uterus, endometrium, ovaries and adnexal regions. COMPARISON:  CT and pelvis 04/22/2016 and pelvic ultrasound 03/10/2015. FINDINGS: Uterus Measurements: 6.3  x 3.7 x 4.6 cm. No fibroids or other mass visualized. Endometrium Thickness: 4 mm, within normal limits. No focal abnormality visualized. Right ovary Measurements: 2.5 x 1.5 x 2.9 cm. Normal appearance/no adnexal mass. Left ovary Measurements: 2.8 x 1.4 x 1.7 cm. Normal appearance/no adnexal mass. Other findings No abnormal free fluid. IMPRESSION: Normal exam. Electronically Signed   By: Leanna Battles M.D.   On: 04/30/2016 09:16   I have personally reviewed and evaluated these images and lab results as part of my medical  decision-making.  Chart review notable for ED visit 8 days ago for similar concerns, reassuring findings, CT results.  On repeat exam the patient remains in similar condition. Patient's findings consistent with pelvic inflammatory disease. No fever, peritonitis, evidence for inpatient treatment. Patient received ceftriaxone intramuscularly, doxycycline, Flagyl.  MDM  And female presents with ongoing lower abdominal pain. On exam the patient has tenderness in the suprapubic area, has vaginal tenderness, and discharge. Findings consistent with pelvic inflammatory disease. No evidence for bacteremia or sepsis. Patient was started on antibiotic regimen, discharged in stable condition with gynecology follow-up.  Gerhard Munch, MD 04/30/16 [phone]

## 2016-04-30 NOTE — ED Notes (Signed)
Patient transported to Ultrasound 

## 2016-05-31 ENCOUNTER — Encounter: Payer: Self-pay | Admitting: Gastroenterology

## 2016-06-24 ENCOUNTER — Emergency Department (HOSPITAL_COMMUNITY): Payer: Medicaid Other

## 2016-06-24 ENCOUNTER — Encounter (HOSPITAL_COMMUNITY): Payer: Self-pay

## 2016-06-24 DIAGNOSIS — R1030 Lower abdominal pain, unspecified: Secondary | ICD-10-CM | POA: Insufficient documentation

## 2016-06-24 DIAGNOSIS — R0789 Other chest pain: Secondary | ICD-10-CM | POA: Insufficient documentation

## 2016-06-24 LAB — CBC
HCT: 38.8 % (ref 36.0–46.0)
Hemoglobin: 12 g/dL (ref 12.0–15.0)
MCH: 27.6 pg (ref 26.0–34.0)
MCHC: 30.9 g/dL (ref 30.0–36.0)
MCV: 89.2 fL (ref 78.0–100.0)
PLATELETS: 272 10*3/uL (ref 150–400)
RBC: 4.35 MIL/uL (ref 3.87–5.11)
RDW: 13.1 % (ref 11.5–15.5)
WBC: 9.4 10*3/uL (ref 4.0–10.5)

## 2016-06-24 LAB — COMPREHENSIVE METABOLIC PANEL
ALK PHOS: 52 U/L (ref 38–126)
ALT: 13 U/L — AB (ref 14–54)
AST: 18 U/L (ref 15–41)
Albumin: 3.4 g/dL — ABNORMAL LOW (ref 3.5–5.0)
Anion gap: 3 — ABNORMAL LOW (ref 5–15)
BUN: 6 mg/dL (ref 6–20)
CALCIUM: 8.7 mg/dL — AB (ref 8.9–10.3)
CHLORIDE: 109 mmol/L (ref 101–111)
CO2: 25 mmol/L (ref 22–32)
CREATININE: 0.53 mg/dL (ref 0.44–1.00)
Glucose, Bld: 87 mg/dL (ref 65–99)
Potassium: 3.3 mmol/L — ABNORMAL LOW (ref 3.5–5.1)
Sodium: 137 mmol/L (ref 135–145)
Total Bilirubin: 0.5 mg/dL (ref 0.3–1.2)
Total Protein: 7 g/dL (ref 6.5–8.1)

## 2016-06-24 LAB — URINE MICROSCOPIC-ADD ON

## 2016-06-24 LAB — URINALYSIS, ROUTINE W REFLEX MICROSCOPIC
Bilirubin Urine: NEGATIVE
GLUCOSE, UA: NEGATIVE mg/dL
HGB URINE DIPSTICK: NEGATIVE
KETONES UR: NEGATIVE mg/dL
Nitrite: NEGATIVE
PROTEIN: NEGATIVE mg/dL
Specific Gravity, Urine: 1.019 (ref 1.005–1.030)
pH: 8 (ref 5.0–8.0)

## 2016-06-24 LAB — POC URINE PREG, ED: PREG TEST UR: NEGATIVE

## 2016-06-24 LAB — LIPASE, BLOOD: LIPASE: 28 U/L (ref 11–51)

## 2016-06-24 LAB — I-STAT TROPONIN, ED: Troponin i, poc: 0 ng/mL (ref 0.00–0.08)

## 2016-06-24 NOTE — ED Notes (Signed)
Pt reports chest pain X1 week with cough and shortness of breath. Pt also reports RLQ abd pain near her c-section incision. Most recent pregnancy was 8 years ago. Pt denies n/v.

## 2016-06-25 ENCOUNTER — Emergency Department (HOSPITAL_COMMUNITY)
Admission: EM | Admit: 2016-06-25 | Discharge: 2016-06-25 | Disposition: A | Discharge status: Home / Self Care | Payer: Medicaid Other | Attending: Emergency Medicine | Admitting: Emergency Medicine

## 2016-06-25 DIAGNOSIS — R1030 Lower abdominal pain, unspecified: Secondary | ICD-10-CM

## 2016-06-25 DIAGNOSIS — R079 Chest pain, unspecified: Secondary | ICD-10-CM

## 2016-06-25 MED ORDER — GI COCKTAIL ~~LOC~~
30.0000 mL | Freq: Once | ORAL | Status: AC
Start: 2016-06-25 — End: 2016-06-25
  Administered 2016-06-25: 30 mL via ORAL
  Filled 2016-06-25: qty 30

## 2016-06-25 MED ORDER — NAPROXEN 500 MG PO TABS: 500.0000 mg | ORAL_TABLET | Freq: Two times a day (BID) | ORAL | Status: DC

## 2016-06-25 NOTE — ED Notes (Signed)
Patient began walking out as RN attempts to go over discharge paperwork. RN did review prescription medication and need for follow-up with her GYN and GI doctor. VS stable. Patient ambulatory with steady gait, escorted to ED entrance in wheelchair.

## 2016-06-25 NOTE — ED Notes (Signed)
Patient requesting that we take off BP cuff, leads, and pulse ox so she may get dressed and go home. RN removed everything and asked patient to wait until her d/c paperwork is printed so we could review them.

## 2016-06-25 NOTE — ED Provider Notes (Signed)
CSN: [id number]     Arrival date & time 06/24/16  2130 History   First MD Initiated Contact with Patient 06/25/16 0246     Chief Complaint  Patient presents with  . Chest Pain  . Abdominal Pain     (Consider location/radiation/quality/duration/timing/severity/associated sxs/prior Treatment) HPI Nancy Oconnor is a 30 y.o. female with no medical problems, presents to emergency department complaining of lower abdominal pain that she has had for approximately 4 months, and new chest pain that she has had for about a week. She states that abdominal pain has been there for 4-5 months. She reports pain is over the C-section incision. She has been seen by OB/GYN, [facility], and this is her third emergency department visit for the same pain. She has had several ultrasounds, has had CT abdomen and pelvis, all came back negative. She was referred to gastroenterologist, but states that they did not have an appointment with gastroenterology until next year. She denies any diarrhea or trouble with bowels. She denies any nausea or vomiting. No fever. She is taking ibuprofen which has not helped her pain. She states her last C-section was 8 years ago. Patient is also complaining of new chest pain yesterday week ago. She reports some increased cough. She denies any hemoptysis. She states she is having some shortness of breath. No recent travel or surgeries. Not on any birth control. No history of clots. Last menstrual cycle was 05/29/2016.  Past Medical History  Diagnosis Date  . PID (pelvic inflammatory disease)    Past Surgical History  Procedure Laterality Date  . Cesarean section     Family History  Problem Relation Age of Onset  . Hypertension Mother   . Hypertension Father    Social History  Substance Use Topics  . Smoking status: Never Smoker   . Smokeless tobacco: Never Used  . Alcohol Use: No   OB History    Gravida Para Term Preterm AB TAB SAB Ectopic Multiple Living   Review of Systems  Constitutional: Negative for fever and chills.  Respiratory: Positive for cough and chest tightness. Negative for shortness of breath.   Cardiovascular: Positive for chest pain. Negative for palpitations and leg swelling.  Gastrointestinal: Positive for abdominal pain. Negative for nausea, vomiting and diarrhea.  Genitourinary: Positive for pelvic pain. Negative for dysuria, flank pain, vaginal bleeding, vaginal discharge and vaginal pain.  Musculoskeletal: Negative for myalgias, arthralgias, neck pain and neck stiffness.  Skin: Negative for rash.  Neurological: Negative for dizziness, weakness and headaches.  All other systems reviewed and are negative.     Allergies  Review of patient's allergies indicates no known allergies.  Home Medications   Prior to Admission medications   Medication Sig Start Date End Date Taking? Authorizing Provider  doxycycline (VIBRA-TABS) 100 MG tablet Take 1 tablet (100 mg total) by mouth 2 (two) times daily. 04/30/16   Gerhard Munch, MD  metroNIDAZOLE (FLAGYL) 500 MG tablet Take 1 tablet (500 mg total) by mouth 2 (two) times daily. 04/30/16   Gerhard Munch, MD  traMADol (ULTRAM) 50 MG tablet Take 1 tablet (50 mg total) by mouth every 6 (six) hours as needed. 04/30/16   Gerhard Munch, MD   BP 105/92 mmHg  Pulse 69  Temp(Src) 97.8 F (36.6 C) (Oral)  Resp 17  Wt 61.961 kg  SpO2 100%  LMP 06/06/2016 (Within Days) Physical Exam  Constitutional: She is oriented to  person, place, and time. She appears well-developed and well-nourished. No distress.  HENT:  Head: Normocephalic.  Eyes: Conjunctivae are normal.  Neck: Neck supple.  Cardiovascular: Normal rate, regular rhythm and normal heart sounds.   Pulmonary/Chest: Effort normal and breath sounds normal. No respiratory distress. She has no wheezes. She has no rales. She exhibits no tenderness.  Abdominal: Soft. Bowel sounds are normal. She exhibits no distension. There is  tenderness. There is no rebound and no guarding.  Tenderness over her C-section incision, mainly in the right lower quadrant, no hernias palpated. No tenderness to McBurney's point. No guarding or rebound tenderness.  Musculoskeletal: She exhibits no edema.  Neurological: She is alert and oriented to person, place, and time.  Skin: Skin is warm and dry.  Psychiatric: She has a normal mood and affect. Her behavior is normal.  Nursing note and vitals reviewed.   ED Course  Procedures (including critical care time) Labs Review Labs Reviewed  COMPREHENSIVE METABOLIC PANEL - Abnormal; Notable for the following:    Potassium 3.3 (*)    Calcium 8.7 (*)    Albumin 3.4 (*)    ALT 13 (*)    Anion gap 3 (*)    All other components within normal limits  URINALYSIS, ROUTINE W REFLEX MICROSCOPIC (NOT AT [facility]) - Abnormal; Notable for the following:    APPearance CLOUDY (*)    Leukocytes, UA TRACE (*)    All other components within normal limits  URINE MICROSCOPIC-ADD ON - Abnormal; Notable for the following:    Squamous Epithelial / LPF 6-30 (*)    Bacteria, UA FEW (*)    All other components within normal limits  LIPASE, BLOOD  CBC  POC URINE PREG, ED  Rosezena SensorI-STAT TROPOININ, ED    Imaging Review Dg Chest 2 View  06/24/2016  CLINICAL DATA:  Chest pain for 1 week. EXAM: CHEST  2 VIEW COMPARISON:  04/22/2016 FINDINGS: Normal heart size and mediastinal contours. No acute infiltrate or edema. No effusion or pneumothorax. No acute osseous findings. IMPRESSION: Negative chest. Electronically Signed   By: Marnee SpringJonathon  Watts M.D.   On: 06/24/2016 23:35   I have personally reviewed and evaluated these images and lab results as part of my medical decision-making.   EKG Interpretation None      MDM   Final diagnoses:  Lower abdominal pain  Chest pain, unspecified chest pain type   Patient with now what appears to be a chronic pain over her C-section incision. I suspect she may have a small hernia  that I cannot palpate. She has had negative ultrasounds and CT scan for the same pain. She denies any urinary symptoms, UA unremarkable. She has had a prior evaluation with pelvic exam, which resulted with no signs of infection. At this point I think patient is stable for discharge home. Her chest pain does not sound cardiac in nature. She is PERC negative. We'll discharge her home with NSAIDs, follow-up with her primary care doctor.  Patient's history, and discharge instructions were through interpreter phone. All questions answered.  Filed Vitals:   06/24/16 2145 06/25/16 0044 06/25/16 0353 06/25/16 0400  BP: 119/85 111/73 105/92 110/80  Pulse: 80 65 69 68  Temp: 98.6 F (37 C) 97.8 F (36.6 C)    TempSrc: Oral Oral    Resp: 16 16 17 14   Weight: 61.961 kg     SpO2: 99% 100% 100% 100%     Jaynie Crumbleatyana Marlos Carmen, PA-C 06/25/16 [id number]  Azalia BilisKevin Campos, MD 06/26/16 [phone]

## 2016-06-25 NOTE — Discharge Instructions (Signed)
Tylenol and naprosyn for pain. Try pepcid daily for chest pain. Please follow up with family doctor for recheck of your abdominal pain.   Pelvic Pain, Female Female pelvic pain can be caused by many different things and start from a variety of places. Pelvic pain refers to pain that is located in the lower half of the abdomen and between your hips. The pain may occur over a short period of time (acute) or may be reoccurring (chronic). The cause of pelvic pain may be related to disorders affecting the female reproductive organs (gynecologic), but it may also be related to the bladder, kidney stones, an intestinal complication, or muscle or skeletal problems. Getting help right away for pelvic pain is important, especially if there has been severe, sharp, or a sudden onset of unusual pain. It is also important to get help right away because some types of pelvic pain can be life threatening.  CAUSES  Below are only some of the causes of pelvic pain. The causes of pelvic pain can be in one of several categories.   Gynecologic.  Pelvic inflammatory disease.  Sexually transmitted infection.  Ovarian cyst or a twisted ovarian ligament (ovarian torsion).  Uterine lining that grows outside the uterus (endometriosis).  Fibroids, cysts, or tumors.  Ovulation.  Pregnancy.  Pregnancy that occurs outside the uterus (ectopic pregnancy).  Miscarriage.  Labor.  Abruption of the placenta or ruptured uterus.  Infection.  Uterine infection (endometritis).  Bladder infection.  Diverticulitis.  Miscarriage related to a uterine infection (septic abortion).  Bladder.  Inflammation of the bladder (cystitis).  Kidney stone(s).  Gastrointestinal.  Constipation.  Diverticulitis.  Neurologic.  Trauma.  Feeling pelvic pain because of mental or emotional causes (psychosomatic).  Cancers of the bowel or pelvis. EVALUATION  Your caregiver will want to take a careful history of your  concerns. This includes recent changes in your health, a careful gynecologic history of your periods (menses), and a sexual history. Obtaining your family history and medical history is also important. Your caregiver may suggest a pelvic exam. A pelvic exam will help identify the location and severity of the pain. It also helps in the evaluation of which organ system may be involved. In order to identify the cause of the pelvic pain and be properly treated, your caregiver may order tests. These tests may include:   A pregnancy test.  Pelvic ultrasonography.  An X-ray exam of the abdomen.  A urinalysis or evaluation of vaginal discharge.  Blood tests. HOME CARE INSTRUCTIONS   Only take over-the-counter or prescription medicines for pain, discomfort, or fever as directed by your caregiver.   Rest as directed by your caregiver.   Eat a balanced diet.   Drink enough fluids to make your urine clear or pale yellow, or as directed.   Avoid sexual intercourse if it causes pain.   Apply warm or cold compresses to the lower abdomen depending on which one helps the pain.   Avoid stressful situations.   Keep a journal of your pelvic pain. Write down when it started, where the pain is located, and if there are things that seem to be associated with the pain, such as food or your menstrual cycle.  Follow up with your caregiver as directed.  SEEK MEDICAL CARE IF:  Your medicine does not help your pain.  You have abnormal vaginal discharge. SEEK IMMEDIATE MEDICAL CARE IF:   You have heavy bleeding from the vagina.   Your pelvic pain increases.   You feel  light-headed or faint.   You have chills.   You have pain with urination or blood in your urine.   You have uncontrolled diarrhea or vomiting.   You have a fever or persistent symptoms for more than 3 days.  You have a fever and your symptoms suddenly get worse.   You are being physically or sexually abused.     This information is not intended to replace advice given to you by your health care provider. Make sure you discuss any questions you have with your health care provider.   Document Released: 10/23/2004 Document Revised: 08/17/2015 Document Reviewed: 03/17/2012 Elsevier Interactive Patient Education 2016 [employer]   Nonspecific Chest Pain  Chest pain can be caused by many different conditions. There is always a chance that your pain could be related to something serious, such as a heart attack or a blood clot in your lungs. Chest pain can also be caused by conditions that are not life-threatening. If you have chest pain, it is very important to follow up with your health care provider. CAUSES  Chest pain can be caused by:  Heartburn.  Pneumonia or bronchitis.  Anxiety or stress.  Inflammation around your heart (pericarditis) or lung (pleuritis or pleurisy).  A blood clot in your lung.  A collapsed lung (pneumothorax). It can develop suddenly on its own (spontaneous pneumothorax) or from trauma to the chest.  Shingles infection (varicella-zoster virus).  Heart attack.  Damage to the bones, muscles, and cartilage that make up your chest wall. This can include:  Bruised bones due to injury.  Strained muscles or cartilage due to frequent or repeated coughing or overwork.  Fracture to one or more ribs.  Sore cartilage due to inflammation (costochondritis). RISK FACTORS  Risk factors for chest pain may include:  Activities that increase your risk for trauma or injury to your chest.  Respiratory infections or conditions that cause frequent coughing.  Medical conditions or overeating that can cause heartburn.  Heart disease or family history of heart disease.  Conditions or health behaviors that increase your risk of developing a blood clot.  Having had chicken pox (varicella zoster). SIGNS AND SYMPTOMS Chest pain can feel like:  Burning or tingling on the surface  of your chest or deep in your chest.  Crushing, pressure, aching, or squeezing pain.  Dull or sharp pain that is worse when you move, cough, or take a deep breath.  Pain that is also felt in your back, neck, shoulder, or arm, or pain that spreads to any of these areas. Your chest pain may come and go, or it may stay constant. DIAGNOSIS Lab tests or other studies may be needed to find the cause of your pain. Your health care provider may have you take a test called an ambulatory ECG (electrocardiogram). An ECG records your heartbeat patterns at the time the test is performed. You may also have other tests, such as:  Transthoracic echocardiogram (TTE). During echocardiography, sound waves are used to create a picture of all of the heart structures and to look at how blood flows through your heart.  Transesophageal echocardiogram (TEE).This is a more advanced imaging test that obtains images from inside your body. It allows your health care provider to see your heart in finer detail.  Cardiac monitoring. This allows your health care provider to monitor your heart rate and rhythm in real time.  Holter monitor. This is a portable device that records your heartbeat and can help to diagnose abnormal heartbeats.  It allows your health care provider to track your heart activity for several days, if needed.  Stress tests. These can be done through exercise or by taking medicine that makes your heart beat more quickly.  Blood tests.  Imaging tests. TREATMENT  Your treatment depends on what is causing your chest pain. Treatment may include:  Medicines. These may include:  Acid blockers for heartburn.  Anti-inflammatory medicine.  Pain medicine for inflammatory conditions.  Antibiotic medicine, if an infection is present.  Medicines to dissolve blood clots.  Medicines to treat coronary artery disease.  Supportive care for conditions that do not require medicines. This may  include:  Resting.  Applying heat or cold packs to injured areas.  Limiting activities until pain decreases. HOME CARE INSTRUCTIONS  If you were prescribed an antibiotic medicine, finish it all even if you start to feel better.  Avoid any activities that bring on chest pain.  Do not use any tobacco products, including cigarettes, chewing tobacco, or electronic cigarettes. If you need help quitting, ask your health care provider.  Do not drink alcohol.  Take medicines only as directed by your health care provider.  Keep all follow-up visits as directed by your health care provider. This is important. This includes any further testing if your chest pain does not go away.  If heartburn is the cause for your chest pain, you may be told to keep your head raised (elevated) while sleeping. This reduces the chance that acid will go from your stomach into your esophagus.  Make lifestyle changes as directed by your health care provider. These may include:  Getting regular exercise. Ask your health care provider to suggest some activities that are safe for you.  Eating a heart-healthy diet. A registered dietitian can help you to learn healthy eating options.  Maintaining a healthy weight.  Managing diabetes, if necessary.  Reducing stress. SEEK MEDICAL CARE IF:  Your chest pain does not go away after treatment.  You have a rash with blisters on your chest.  You have a fever. SEEK IMMEDIATE MEDICAL CARE IF:   Your chest pain is worse.  You have an increasing cough, or you cough up blood.  You have severe abdominal pain.  You have severe weakness.  You faint.  You have chills.  You have sudden, unexplained chest discomfort.  You have sudden, unexplained discomfort in your arms, back, neck, or jaw.  You have shortness of breath at any time.  You suddenly start to sweat, or your skin gets clammy.  You feel nauseous or you vomit.  You suddenly feel light-headed or  dizzy.  Your heart begins to beat quickly, or it feels like it is skipping beats. These symptoms may represent a serious problem that is an emergency. Do not wait to see if the symptoms will go away. Get medical help right away. Call your local emergency services (911 in the [place] Do not drive yourself to the hospital.   This information is not intended to replace advice given to you by your health care provider. Make sure you discuss any questions you have with your health care provider.   Document Released: 09/05/2005 Document Revised: 12/17/2014 Document Reviewed: 07/02/2014 Elsevier Interactive Patient Education [employer]

## 2016-06-25 NOTE — ED Notes (Signed)
PA-C at bedside, on phone with interpreter.

## 2016-06-25 NOTE — ED Notes (Signed)
PA-C at bedside 

## 2016-08-16 ENCOUNTER — Ambulatory Visit: Payer: BLUE CROSS/BLUE SHIELD | Admitting: Gastroenterology

## 2016-09-06 ENCOUNTER — Emergency Department (HOSPITAL_COMMUNITY)
Admission: EM | Admit: 2016-09-06 | Discharge: 2016-09-06 | Disposition: A | Discharge status: Home / Self Care | Payer: Medicaid Other | Attending: Emergency Medicine | Admitting: Emergency Medicine

## 2016-09-06 ENCOUNTER — Emergency Department (HOSPITAL_COMMUNITY): Payer: Medicaid Other

## 2016-09-06 ENCOUNTER — Encounter (HOSPITAL_COMMUNITY): Payer: Self-pay | Admitting: Emergency Medicine

## 2016-09-06 DIAGNOSIS — R109 Unspecified abdominal pain: Secondary | ICD-10-CM | POA: Diagnosis present

## 2016-09-06 DIAGNOSIS — K5900 Constipation, unspecified: Secondary | ICD-10-CM | POA: Diagnosis not present

## 2016-09-06 DIAGNOSIS — Z30432 Encounter for removal of intrauterine contraceptive device: Secondary | ICD-10-CM | POA: Insufficient documentation

## 2016-09-06 DIAGNOSIS — R1084 Generalized abdominal pain: Secondary | ICD-10-CM

## 2016-09-06 LAB — CBC WITH DIFFERENTIAL/PLATELET
BASOS PCT: 0 %
Basophils Absolute: 0 10*3/uL (ref 0.0–0.1)
EOS ABS: 0.2 10*3/uL (ref 0.0–0.7)
EOS PCT: 3 %
HCT: 41.1 % (ref 36.0–46.0)
HEMOGLOBIN: 12.9 g/dL (ref 12.0–15.0)
LYMPHS ABS: 4 10*3/uL (ref 0.7–4.0)
Lymphocytes Relative: 51 %
MCH: 28.4 pg (ref 26.0–34.0)
MCHC: 31.4 g/dL (ref 30.0–36.0)
MCV: 90.3 fL (ref 78.0–100.0)
MONOS PCT: 9 %
Monocytes Absolute: 0.7 10*3/uL (ref 0.1–1.0)
NEUTROS PCT: 37 %
Neutro Abs: 2.9 10*3/uL (ref 1.7–7.7)
PLATELETS: 279 10*3/uL (ref 150–400)
RBC: 4.55 MIL/uL (ref 3.87–5.11)
RDW: 12.9 % (ref 11.5–15.5)
WBC: 7.9 10*3/uL (ref 4.0–10.5)

## 2016-09-06 LAB — COMPREHENSIVE METABOLIC PANEL
ALBUMIN: 3.6 g/dL (ref 3.5–5.0)
ALT: 13 U/L — ABNORMAL LOW (ref 14–54)
ANION GAP: 7 (ref 5–15)
AST: 20 U/L (ref 15–41)
Alkaline Phosphatase: 70 U/L (ref 38–126)
BUN: 5 mg/dL — ABNORMAL LOW (ref 6–20)
CALCIUM: 8.8 mg/dL — AB (ref 8.9–10.3)
CHLORIDE: 104 mmol/L (ref 101–111)
CO2: 27 mmol/L (ref 22–32)
Creatinine, Ser: 0.65 mg/dL (ref 0.44–1.00)
GFR calc non Af Amer: >60 mL/min (ref >60)
GLUCOSE: 135 mg/dL — AB (ref 65–99)
POTASSIUM: 3.7 mmol/L (ref 3.5–5.1)
SODIUM: 138 mmol/L (ref 135–145)
Total Bilirubin: 0.4 mg/dL (ref 0.3–1.2)
Total Protein: 7.2 g/dL (ref 6.5–8.1)

## 2016-09-06 LAB — WET PREP, GENITAL
Clue Cells Wet Prep HPF POC: NONE SEEN
SPERM: NONE SEEN
TRICH WET PREP: NONE SEEN
Yeast Wet Prep HPF POC: NONE SEEN

## 2016-09-06 LAB — URINE MICROSCOPIC-ADD ON

## 2016-09-06 LAB — URINALYSIS, ROUTINE W REFLEX MICROSCOPIC
BILIRUBIN URINE: NEGATIVE
GLUCOSE, UA: NEGATIVE mg/dL
KETONES UR: NEGATIVE mg/dL
LEUKOCYTES UA: NEGATIVE
NITRITE: NEGATIVE
PH: 8 (ref 5.0–8.0)
PROTEIN: NEGATIVE mg/dL
Specific Gravity, Urine: 1.024 (ref 1.005–1.030)

## 2016-09-06 LAB — POC URINE PREG, ED: Preg Test, Ur: NEGATIVE

## 2016-09-06 MED ORDER — POLYETHYLENE GLYCOL 3350 17 G PO PACK: 17.0000 g | PACK | Freq: Every day | ORAL | 0 refills | Status: DC

## 2016-09-06 MED ORDER — KETOROLAC TROMETHAMINE 10 MG PO TABS: 10.0000 mg | ORAL_TABLET | Freq: Four times a day (QID) | ORAL | 0 refills | Status: DC | PRN

## 2016-09-06 MED ORDER — KETOROLAC TROMETHAMINE 30 MG/ML IJ SOLN
30.0000 mg | Freq: Once | INTRAMUSCULAR | Status: AC
  Administered 2016-09-06: 30 mg via INTRAMUSCULAR
  Filled 2016-09-06: qty 1

## 2016-09-06 NOTE — ED Triage Notes (Addendum)
Pt st's she has been having generalized abd pain x's 2 months or more.  St's pain in lower abd is worse also pain in lower back and buttucks.  Pt also c/o left shoulder pain with no known injury.  Pt denies vag. Discharge but has hx of PID.  Pt now c/o headache as well

## 2016-09-06 NOTE — ED Provider Notes (Signed)
[facility]-EMERGENCY DEPT Provider Note   CSN: [id number] Arrival date & time: 09/06/16  1728     History   Chief Complaint Chief Complaint  Patient presents with  . Abdominal Pain    HPI Nancy Oconnor is a 30 y.o. female.  Pt presents to the ED today with abdominal pain.  She has been having pain for months and has had ct scans and pelvic us for eval.  The pt denies any f/c.  She did note that she had an IUD replaced 3 days ago, but she was having the same pain before it was replaced.  Pt denies any vaginal bleeding or d/c.      Past Medical History:  Diagnosis Date  . PID (pelvic inflammatory disease)     Patient Active Problem List   Diagnosis Date Noted  . Complicated UTI (urinary tract infection) 03/10/2015  . PID (acute pelvic inflammatory disease) 03/10/2015  . Nausea with vomiting     Past Surgical History:  Procedure Laterality Date  . CESAREAN SECTION      OB History    Gravida Para Term Preterm AB Living   1 1 1          SAB TAB Ectopic Multiple Live Births                   Home Medications    Prior to Admission medications   Medication Sig Start Date End Date Taking? Authorizing Provider  doxycycline (VIBRA-TABS) 100 MG tablet Take 1 tablet (100 mg total) by mouth 2 (two) times daily. 04/30/16   Gerhard Munchobert Lockwood, MD  ketorolac (TORADOL) 10 MG tablet Take 1 tablet (10 mg total) by mouth every 6 (six) hours as needed. 09/06/16   Jacalyn LefevreJulie Shayma Pfefferle, MD  metroNIDAZOLE (FLAGYL) 500 MG tablet Take 1 tablet (500 mg total) by mouth 2 (two) times daily. 04/30/16   Gerhard Munchobert Lockwood, MD  naproxen (NAPROSYN) 500 MG tablet Take 1 tablet (500 mg total) by mouth 2 (two) times daily. 06/25/16   Tatyana Kirichenko, PA-C  polyethylene glycol (MIRALAX / GLYCOLAX) packet Take 17 g by mouth daily. 09/06/16   Jacalyn LefevreJulie Nayanna Seaborn, MD  traMADol (ULTRAM) 50 MG tablet Take 1 tablet (50 mg total) by mouth every 6 (six) hours as needed. 04/30/16   Gerhard Munchobert Lockwood, MD    Family History Family  History  Problem Relation Age of Onset  . Hypertension Mother   . Hypertension Father     Social History Social History  Substance Use Topics  . Smoking status: Never Smoker  . Smokeless tobacco: Never Used  . Alcohol use No     Allergies   Review of patient's allergies indicates no known allergies.   Review of Systems Review of Systems  Gastrointestinal: Positive for abdominal pain.  All other systems reviewed and are negative.    Physical Exam Updated Vital Signs BP 119/80   Pulse 75   Temp 97.7 F (36.5 C) (Oral)   Resp 16   Wt 142 lb 1.6 oz (64.5 kg)   LMP 08/31/2016   SpO2 100%   BMI 27.75 kg/m   Physical Exam  Constitutional: She is oriented to person, place, and time. She appears well-developed and well-nourished.  HENT:  Head: Normocephalic and atraumatic.  Right Ear: External ear normal.  Left Ear: External ear normal.  Nose: Nose normal.  Mouth/Throat: Oropharynx is clear and moist.  Eyes: Conjunctivae and EOM are normal. Pupils are equal, round, and reactive to light.  Neck: Normal range of motion.  Neck supple.  Cardiovascular: Normal rate, regular rhythm, normal heart sounds and intact distal pulses.   Pulmonary/Chest: Effort normal and breath sounds normal.  Abdominal: Soft. Bowel sounds are normal. There is tenderness in the suprapubic area.  Genitourinary: Vagina normal and uterus normal. Cervix exhibits motion tenderness. Right adnexum displays no tenderness. Left adnexum displays no tenderness. No vaginal discharge found.  Genitourinary Comments: IUD string in place  Rectal:  Stool in vault.  No lesions noted.  Musculoskeletal: Normal range of motion.  Neurological: She is alert and oriented to person, place, and time.  Skin: Skin is warm.  Psychiatric: She has a normal mood and affect. Her behavior is normal. Judgment and thought content normal.  Nursing note and vitals reviewed.    ED Treatments / Results  Labs (all labs ordered are  listed, but only abnormal results are displayed) Labs Reviewed  WET PREP, GENITAL - Abnormal; Notable for the following:       Result Value   WBC, Wet Prep HPF POC MANY (*)    All other components within normal limits  URINALYSIS, ROUTINE W REFLEX MICROSCOPIC (NOT AT [facility]) - Abnormal; Notable for the following:    Hgb urine dipstick SMALL (*)    All other components within normal limits  COMPREHENSIVE METABOLIC PANEL - Abnormal; Notable for the following:    Glucose, Bld 135 (*)    BUN 5 (*)    Calcium 8.8 (*)    ALT 13 (*)    All other components within normal limits  URINE MICROSCOPIC-ADD ON - Abnormal; Notable for the following:    Squamous Epithelial / LPF 0-5 (*)    Bacteria, UA RARE (*)    All other components within normal limits  CBC WITH DIFFERENTIAL/PLATELET  POC URINE PREG, ED  GC/CHLAMYDIA PROBE AMP ([place]) NOT AT [facility]    EKG  EKG Interpretation None       Radiology Dg Abdomen Acute W/chest  Result Date: 09/06/2016 CLINICAL DATA:  30 year old female with generalized abdominal pain. EXAM: DG ABDOMEN ACUTE W/ 1V CHEST COMPARISON:  Chest radiograph dated 06/24/2016 and abdominal CT dated 04/22/2016 FINDINGS: The lungs are clear. There is no pleural effusion or pneumothorax. The cardiac silhouette is within normal limits. No acute osseous pathology identified. There is moderate stool throughout the colon. No bowel dilatation or evidence of obstruction. No radiopaque calculi identified. An intrauterine device is noted. The osseous structures and the soft tissues appear unremarkable. IMPRESSION: Negative abdominal radiographs.  No acute cardiopulmonary disease. Electronically Signed   By: Elgie Collard M.D.   On: 09/06/2016 23:18    Procedures Procedures (including critical care time)  Medications Ordered in ED Medications  ketorolac (TORADOL) 30 MG/ML injection 30 mg (30 mg Intramuscular Given 09/06/16 2257)     Initial Impression / Assessment and Plan /  ED Course  I have reviewed the triage vital signs and the nursing notes.  Pertinent labs & imaging results that were available during my care of the patient were reviewed by me and considered in my medical decision making (see chart for details).  Clinical Course   Pt has had previous evaluations for her abdominal pain. Her labs and vitals are normal, so I don't think I need to do an additional radiological study.  The pt is given the number for GI to follow up.  She knows to return if worse.  Final Clinical Impressions(s) / ED Diagnoses   Final diagnoses:  Generalized abdominal pain  Constipation, unspecified constipation type  New Prescriptions New Prescriptions   KETOROLAC (TORADOL) 10 MG TABLET    Take 1 tablet (10 mg total) by mouth every 6 (six) hours as needed.   POLYETHYLENE GLYCOL (MIRALAX / GLYCOLAX) PACKET    Take 17 g by mouth daily.     Jacalyn Lefevre, MD 09/06/16 [phone]

## 2016-09-06 NOTE — ED Notes (Signed)
All triage notes made by this nurse.

## 2016-09-07 LAB — GC/CHLAMYDIA PROBE AMP (~~LOC~~) NOT AT ARMC
CHLAMYDIA, DNA PROBE: NEGATIVE
NEISSERIA GONORRHEA: NEGATIVE

## 2016-09-27 NOTE — Patient Instructions (Addendum)
Your procedure is scheduled on:  Monday, Oct. 30, 2017  [facility] at:  7:45 AM  Pick up the phone at the desk and dial [phone].  Call this number if you have problems the morning of surgery: [phone].  Remember: Do NOT eat food or drink after:  Midnight Sunday, Oct. 29. 2017  Take these medicines the morning of surgery with a SIP OF WATER:  None  Stop ALL herbal medications at this time   Do NOT wear jewelry (body piercing), metal hair clips/bobby pins, make-up, or nail polish. Do NOT wear lotions, powders, or perfumes.  You may wear deodorant. Do NOT shave for 48 hours prior to surgery. Do NOT bring valuables to the hospital. Contacts, dentures, or bridgework may not be worn into surgery.  Have a responsible adult drive you home and stay with you for 24 hours after your procedure

## 2016-09-28 ENCOUNTER — Encounter (HOSPITAL_COMMUNITY)
Admission: RE | Admit: 2016-09-28 | Discharge: 2016-09-28 | Disposition: A | Discharge status: Home / Self Care | Payer: Medicaid Other | Source: Ambulatory Visit | Attending: Obstetrics and Gynecology | Admitting: Obstetrics and Gynecology

## 2016-09-28 ENCOUNTER — Encounter (HOSPITAL_COMMUNITY): Payer: Self-pay

## 2016-09-28 ENCOUNTER — Other Ambulatory Visit: Payer: Self-pay

## 2016-09-28 DIAGNOSIS — Z01810 Encounter for preprocedural cardiovascular examination: Secondary | ICD-10-CM | POA: Insufficient documentation

## 2016-09-28 DIAGNOSIS — G8929 Other chronic pain: Secondary | ICD-10-CM | POA: Diagnosis not present

## 2016-09-28 DIAGNOSIS — Z01812 Encounter for preprocedural laboratory examination: Secondary | ICD-10-CM | POA: Insufficient documentation

## 2016-09-28 DIAGNOSIS — R102 Pelvic and perineal pain: Secondary | ICD-10-CM | POA: Diagnosis not present

## 2016-09-28 HISTORY — DX: Other specified cough: R05.8

## 2016-09-28 HISTORY — DX: Chest pain, unspecified: R07.9

## 2016-09-28 HISTORY — DX: Headache, unspecified: R51.9

## 2016-09-28 HISTORY — DX: Other specified disorders of nose and nasal sinuses: J34.89

## 2016-09-28 HISTORY — DX: Cough: R05

## 2016-09-28 HISTORY — DX: Dyspnea, unspecified: R06.00

## 2016-09-28 HISTORY — DX: Chills (without fever): R68.83

## 2016-09-28 HISTORY — DX: Headache: R51

## 2016-09-28 LAB — CBC
HCT: 42.4 % (ref 36.0–46.0)
Hemoglobin: 13.8 g/dL (ref 12.0–15.0)
MCH: 28.5 pg (ref 26.0–34.0)
MCHC: 32.5 g/dL (ref 30.0–36.0)
MCV: 87.6 fL (ref 78.0–100.0)
PLATELETS: 249 10*3/uL (ref 150–400)
RBC: 4.84 MIL/uL (ref 3.87–5.11)
RDW: 13.1 % (ref 11.5–15.5)
WBC: 6.5 10*3/uL (ref 4.0–10.5)

## 2016-09-28 MED ORDER — LIDOCAINE HCL 1 % IJ SOLN
INTRAMUSCULAR | Status: AC
  Filled 2016-09-28: qty 20

## 2016-09-28 NOTE — Pre-Procedure Instructions (Signed)
Kathrynn DuckingMaha Mohamed, Clinical research associateArabic Interpreter present for [facility] appointment

## 2016-09-28 NOTE — H&P (Signed)
Nancy Oconnor is an 30 y.o. female. She presents for scheduled diagnostic laparoscopy and possible lysis of adhesions. Pt c/o pelvic pain x 2years. Pt has had benign pelvic exams, US and been cleared by GI. She cannot describe any precipitating or palliative factors. She denies any changes in BMs. Pt has a history of c/s but pain did not present until a few years later. She was offered diagnostic surgery as last resort to assess source of pain. [facility]  Pertinent Gynecological History: Menses: flow is light Bleeding: regular Contraception: IUD DES exposure: unknown Blood transfusions: none Sexually transmitted diseases: no past history Previous GYN Procedures: c/s  Last mammogram: n/a Date:  Last pap: normal Date: 2017    Menstrual History: Menarche age: [age] Patient's last menstrual period was 08/31/2016.    Past Medical History:  Diagnosis Date  . PID (pelvic inflammatory disease)     Past Surgical History:  Procedure Laterality Date  . CESAREAN SECTION      Family History  Problem Relation Age of Onset  . Hypertension Mother   . Hypertension Father     Social History:  reports that she has never smoked. She has never used smokeless tobacco. She reports that she does not drink alcohol or use drugs.  Allergies: No Known Allergies  No prescriptions prior to admission.    Review of Systems  Constitutional: Positive for malaise/fatigue. Negative for chills, fever and weight loss.  Eyes: Negative for blurred vision.  Respiratory: Negative for cough and shortness of breath.   Cardiovascular: Negative for chest pain and leg swelling.  Gastrointestinal: Positive for abdominal pain. Negative for blood in stool, constipation, diarrhea, heartburn, nausea and vomiting.  Genitourinary: Negative for dysuria, flank pain, frequency, hematuria and urgency.  Musculoskeletal: Positive for back pain and myalgias. Negative for falls, joint pain and neck  pain.  Skin: Negative for itching and rash.  Neurological: Positive for dizziness. Negative for headaches.  Endo/Heme/Allergies: Bruises/bleeds easily.  Psychiatric/Behavioral: Positive for depression. Negative for substance abuse and suicidal ideas. The patient is nervous/anxious.     Last menstrual period 08/31/2016. Physical Exam  Constitutional: She is oriented to person, place, and time. She appears well-developed and well-nourished.  HENT:  Head: Normocephalic.  Eyes: Pupils are equal, round, and reactive to light.  Neck: Normal range of motion.  Cardiovascular: Regular rhythm.   Respiratory: Effort normal.  GI: Soft. She exhibits no distension and no mass. There is tenderness. There is guarding. There is no rebound.  Genitourinary: Vagina normal and uterus normal.  Musculoskeletal: Normal range of motion.  Neurological: She is alert and oriented to person, place, and time.  Skin: Skin is warm.  Psychiatric: She has a normal mood and affect. Her behavior is normal. Judgment and thought content normal.    No results found for this or any previous visit (from the past 24 hour(s)).  No results found.  Assessment/Plan: 30yo with chronic pelvic pain for diagnostic laparoscopy, possible lysis of adhesions Verbal and written consent obtained after r/b/a reviewed All questions answered TO OR when ready  Nancy Oconnor 09/28/2016, 9:13 AM

## 2016-10-08 ENCOUNTER — Encounter (HOSPITAL_COMMUNITY): Payer: Self-pay | Admitting: Emergency Medicine

## 2016-10-08 ENCOUNTER — Encounter (HOSPITAL_COMMUNITY)
Admission: RE | Disposition: A | Discharge status: Home / Self Care | Payer: Self-pay | Source: Ambulatory Visit | Attending: Obstetrics and Gynecology

## 2016-10-08 ENCOUNTER — Ambulatory Visit (HOSPITAL_COMMUNITY)
Admission: RE | Admit: 2016-10-08 | Discharge: 2016-10-08 | Disposition: A | Discharge status: Home / Self Care | Payer: Medicaid Other | Source: Ambulatory Visit | Attending: Obstetrics and Gynecology | Admitting: Obstetrics and Gynecology

## 2016-10-08 ENCOUNTER — Ambulatory Visit (HOSPITAL_COMMUNITY): Payer: Medicaid Other | Admitting: Anesthesiology

## 2016-10-08 DIAGNOSIS — Z30432 Encounter for removal of intrauterine contraceptive device: Secondary | ICD-10-CM | POA: Diagnosis not present

## 2016-10-08 DIAGNOSIS — R102 Pelvic and perineal pain: Secondary | ICD-10-CM | POA: Insufficient documentation

## 2016-10-08 DIAGNOSIS — G8929 Other chronic pain: Secondary | ICD-10-CM | POA: Diagnosis present

## 2016-10-08 DIAGNOSIS — K660 Peritoneal adhesions (postprocedural) (postinfection): Secondary | ICD-10-CM | POA: Insufficient documentation

## 2016-10-08 HISTORY — PX: IUD REMOVAL: SHX5392

## 2016-10-08 HISTORY — PX: LAPAROSCOPY: SHX197

## 2016-10-08 HISTORY — PX: LYSIS OF ADHESION: SHX5961

## 2016-10-08 LAB — TYPE AND SCREEN
ABO/RH(D): O POS
ANTIBODY SCREEN: NEGATIVE

## 2016-10-08 LAB — ABO/RH: ABO/RH(D): O POS

## 2016-10-08 LAB — PREGNANCY, URINE: Preg Test, Ur: NEGATIVE

## 2016-10-08 SURGERY — LAPAROSCOPY, DIAGNOSTIC
Anesthesia: General | Site: Vagina

## 2016-10-08 MED ORDER — ONDANSETRON HCL 4 MG/2ML IJ SOLN
INTRAMUSCULAR | Status: DC | PRN
  Administered 2016-10-08 (×2): 2 mg via INTRAVENOUS

## 2016-10-08 MED ORDER — BUPIVACAINE HCL (PF) 0.25 % IJ SOLN
INTRAMUSCULAR | Status: DC | PRN
  Administered 2016-10-08: 7 mL
  Administered 2016-10-08: 3 mL

## 2016-10-08 MED ORDER — DEXAMETHASONE SODIUM PHOSPHATE 10 MG/ML IJ SOLN
INTRAMUSCULAR | Status: DC | PRN
  Administered 2016-10-08: 5 mg via INTRAVENOUS

## 2016-10-08 MED ORDER — PROPOFOL 10 MG/ML IV BOLUS
INTRAVENOUS | Status: DC | PRN
  Administered 2016-10-08: 140 mg via INTRAVENOUS

## 2016-10-08 MED ORDER — SCOPOLAMINE 1 MG/3DAYS TD PT72
MEDICATED_PATCH | TRANSDERMAL | Status: AC
  Administered 2016-10-08: 1.5 mg via TRANSDERMAL
  Filled 2016-10-08: qty 1

## 2016-10-08 MED ORDER — PROMETHAZINE HCL 25 MG/ML IJ SOLN
INTRAMUSCULAR | Status: AC
  Filled 2016-10-08: qty 1

## 2016-10-08 MED ORDER — ROCURONIUM BROMIDE 100 MG/10ML IV SOLN
INTRAVENOUS | Status: AC
  Filled 2016-10-08: qty 1

## 2016-10-08 MED ORDER — LIDOCAINE HCL (CARDIAC) 20 MG/ML IV SOLN
INTRAVENOUS | Status: DC | PRN
  Administered 2016-10-08: 80 mg via INTRAVENOUS

## 2016-10-08 MED ORDER — PROPOFOL 10 MG/ML IV BOLUS
INTRAVENOUS | Status: AC
  Filled 2016-10-08: qty 20

## 2016-10-08 MED ORDER — GLYCOPYRROLATE 0.2 MG/ML IJ SOLN
INTRAMUSCULAR | Status: AC
  Filled 2016-10-08: qty 3

## 2016-10-08 MED ORDER — SUCCINYLCHOLINE CHLORIDE 20 MG/ML IJ SOLN: INTRAMUSCULAR | Status: DC | PRN

## 2016-10-08 MED ORDER — MENTHOL 3 MG MT LOZG
1.0000 | LOZENGE | OROMUCOSAL | Status: DC | PRN
  Filled 2016-10-08: qty 9

## 2016-10-08 MED ORDER — FENTANYL CITRATE (PF) 100 MCG/2ML IJ SOLN
INTRAMUSCULAR | Status: DC | PRN
  Administered 2016-10-08 (×3): 50 ug via INTRAVENOUS

## 2016-10-08 MED ORDER — IBUPROFEN 600 MG PO TABS: 600.0000 mg | ORAL_TABLET | Freq: Four times a day (QID) | ORAL | Status: DC | PRN

## 2016-10-08 MED ORDER — FENTANYL CITRATE (PF) 250 MCG/5ML IJ SOLN
INTRAMUSCULAR | Status: AC
  Filled 2016-10-08: qty 5

## 2016-10-08 MED ORDER — ONDANSETRON HCL 4 MG/2ML IJ SOLN: 4.0000 mg | Freq: Four times a day (QID) | INTRAMUSCULAR | Status: DC | PRN

## 2016-10-08 MED ORDER — OXYCODONE-ACETAMINOPHEN 5-325 MG PO TABS: 1.0000 | ORAL_TABLET | ORAL | Status: DC | PRN

## 2016-10-08 MED ORDER — ONDANSETRON HCL 4 MG PO TABS: 4.0000 mg | ORAL_TABLET | Freq: Four times a day (QID) | ORAL | Status: DC | PRN

## 2016-10-08 MED ORDER — MIDAZOLAM HCL 2 MG/2ML IJ SOLN
INTRAMUSCULAR | Status: DC | PRN
  Administered 2016-10-08: 1 mg via INTRAVENOUS

## 2016-10-08 MED ORDER — LIDOCAINE HCL (CARDIAC) 20 MG/ML IV SOLN
INTRAVENOUS | Status: AC
  Filled 2016-10-08: qty 5

## 2016-10-08 MED ORDER — KETOROLAC TROMETHAMINE 30 MG/ML IJ SOLN
INTRAMUSCULAR | Status: AC
  Filled 2016-10-08: qty 1

## 2016-10-08 MED ORDER — OXYCODONE-ACETAMINOPHEN 5-325 MG PO TABS: 1.0000 | ORAL_TABLET | Freq: Four times a day (QID) | ORAL | 0 refills | Status: DC | PRN

## 2016-10-08 MED ORDER — MIDAZOLAM HCL 2 MG/2ML IJ SOLN
INTRAMUSCULAR | Status: AC
  Filled 2016-10-08: qty 2

## 2016-10-08 MED ORDER — NEOSTIGMINE METHYLSULFATE 10 MG/10ML IV SOLN
INTRAVENOUS | Status: DC | PRN
  Administered 2016-10-08: 3 mg via INTRAVENOUS

## 2016-10-08 MED ORDER — SODIUM CHLORIDE 0.9 % IJ SOLN
INTRAMUSCULAR | Status: AC
  Filled 2016-10-08: qty 10

## 2016-10-08 MED ORDER — NEOSTIGMINE METHYLSULFATE 10 MG/10ML IV SOLN
INTRAVENOUS | Status: AC
  Filled 2016-10-08: qty 1

## 2016-10-08 MED ORDER — BUPIVACAINE HCL (PF) 0.25 % IJ SOLN
INTRAMUSCULAR | Status: AC
  Filled 2016-10-08: qty 30

## 2016-10-08 MED ORDER — NORGESTIMATE-ETH ESTRADIOL 0.25-35 MG-MCG PO TABS: 1.0000 | ORAL_TABLET | Freq: Every day | ORAL | 11 refills | Status: DC

## 2016-10-08 MED ORDER — LACTATED RINGERS IV SOLN: INTRAVENOUS | Status: DC

## 2016-10-08 MED ORDER — ONDANSETRON HCL 4 MG/2ML IJ SOLN
INTRAMUSCULAR | Status: AC
  Filled 2016-10-08: qty 2

## 2016-10-08 MED ORDER — SIMETHICONE 80 MG PO CHEW
80.0000 mg | CHEWABLE_TABLET | Freq: Four times a day (QID) | ORAL | Status: DC | PRN
  Filled 2016-10-08: qty 1

## 2016-10-08 MED ORDER — GLYCOPYRROLATE 0.2 MG/ML IJ SOLN
INTRAMUSCULAR | Status: DC | PRN
  Administered 2016-10-08 (×2): 0.1 mg via INTRAVENOUS
  Administered 2016-10-08: 0.4 mg via INTRAVENOUS

## 2016-10-08 MED ORDER — SCOPOLAMINE 1 MG/3DAYS TD PT72
1.0000 | MEDICATED_PATCH | Freq: Once | TRANSDERMAL | Status: DC
  Administered 2016-10-08: 1.5 mg via TRANSDERMAL

## 2016-10-08 MED ORDER — IBUPROFEN 600 MG PO TABS: 600.0000 mg | ORAL_TABLET | Freq: Four times a day (QID) | ORAL | 1 refills | Status: DC | PRN

## 2016-10-08 MED ORDER — KETOROLAC TROMETHAMINE 30 MG/ML IJ SOLN
INTRAMUSCULAR | Status: DC | PRN
  Administered 2016-10-08: 30 mg via INTRAVENOUS

## 2016-10-08 MED ORDER — DEXAMETHASONE SODIUM PHOSPHATE 10 MG/ML IJ SOLN
INTRAMUSCULAR | Status: AC
  Filled 2016-10-08: qty 1

## 2016-10-08 MED ORDER — PROMETHAZINE HCL 25 MG/ML IJ SOLN
6.2500 mg | INTRAMUSCULAR | Status: DC | PRN
  Administered 2016-10-08: 6.25 mg via INTRAVENOUS

## 2016-10-08 MED ORDER — HYDROMORPHONE HCL 1 MG/ML IJ SOLN: 0.2500 mg | INTRAMUSCULAR | Status: DC | PRN

## 2016-10-08 MED ORDER — ROCURONIUM BROMIDE 100 MG/10ML IV SOLN
INTRAVENOUS | Status: DC | PRN
  Administered 2016-10-08: 25 mg via INTRAVENOUS

## 2016-10-08 MED ORDER — LACTATED RINGERS IV SOLN
INTRAVENOUS | Status: DC
  Administered 2016-10-08 (×2): via INTRAVENOUS

## 2016-10-08 SURGICAL SUPPLY — 33 items
CABLE HIGH FREQUENCY MONO STRZ (ELECTRODE) IMPLANT
CATH ROBINSON RED A/P 16FR (CATHETERS) ×3 IMPLANT
CLOTH BEACON ORANGE TIMEOUT ST (SAFETY) ×3 IMPLANT
DECANTER SPIKE VIAL GLASS SM (MISCELLANEOUS) ×3 IMPLANT
DRSG OPSITE POSTOP 3X4 (GAUZE/BANDAGES/DRESSINGS) ×3 IMPLANT
DURAPREP 26ML APPLICATOR (WOUND CARE) ×3 IMPLANT
FILTER SMOKE EVAC LAPAROSHD (FILTER) ×3 IMPLANT
GLOVE BIO SURGEON STRL SZ 6.5 (GLOVE) ×3 IMPLANT
GLOVE BIOGEL PI IND STRL 7.0 (GLOVE) ×2 IMPLANT
GLOVE BIOGEL PI INDICATOR 7.0 (GLOVE) ×1
GOWN STRL REUS W/TWL LRG LVL3 (GOWN DISPOSABLE) ×9 IMPLANT
LIQUID BAND (GAUZE/BANDAGES/DRESSINGS) ×3 IMPLANT
NEEDLE INSUFFLATION 120MM (ENDOMECHANICALS) ×3 IMPLANT
NS IRRIG 1000ML POUR BTL (IV SOLUTION) IMPLANT
PACK LAPAROSCOPY BASIN (CUSTOM PROCEDURE TRAY) ×3 IMPLANT
PACK TRENDGUARD 450 HYBRID PRO (MISCELLANEOUS) ×2 IMPLANT
PACK TRENDGUARD 600 HYBRD PROC (MISCELLANEOUS) IMPLANT
POUCH SPECIMEN RETRIEVAL 10MM (ENDOMECHANICALS) IMPLANT
PROTECTOR NERVE ULNAR (MISCELLANEOUS) ×6 IMPLANT
SET IRRIG TUBING LAPAROSCOPIC (IRRIGATION / IRRIGATOR) IMPLANT
SHEARS HARMONIC ACE PLUS 36CM (ENDOMECHANICALS) ×3 IMPLANT
SLEEVE XCEL OPT CAN 5 100 (ENDOMECHANICALS) ×3 IMPLANT
SOLUTION ELECTROLUBE (MISCELLANEOUS) IMPLANT
SUT VIC AB 3-0 PS2 18 (SUTURE) ×1
SUT VIC AB 3-0 PS2 18XBRD (SUTURE) ×2 IMPLANT
SUT VICRYL 0 UR6 27IN ABS (SUTURE) IMPLANT
TOWEL OR 17X24 6PK STRL BLUE (TOWEL DISPOSABLE) ×6 IMPLANT
TRENDGUARD 450 HYBRID PRO PACK (MISCELLANEOUS) ×3
TRENDGUARD 600 HYBRID PROC PK (MISCELLANEOUS)
TROCAR BLADELESS OPT 5 100 (ENDOMECHANICALS) ×3 IMPLANT
TROCAR XCEL NON-BLD 11X100MML (ENDOMECHANICALS) IMPLANT
WARMER LAPAROSCOPE (MISCELLANEOUS) ×3 IMPLANT
WATER STERILE IRR 1000ML POUR (IV SOLUTION) ×3 IMPLANT

## 2016-10-08 NOTE — Anesthesia Preprocedure Evaluation (Addendum)
Anesthesia Evaluation  Patient identified by MRN, date of birth, ID band Patient awake    Reviewed: Allergy & Precautions, NPO status , Patient's Chart, lab work & pertinent test results  Airway Mallampati: II  TM Distance: >3 FB Neck ROM: Full    Dental no notable dental hx. (+) Dental Advisory Given   Pulmonary neg pulmonary ROS,    Pulmonary exam normal        Cardiovascular negative cardio ROS Normal cardiovascular exam     Neuro/Psych  Headaches, negative psych ROS   GI/Hepatic negative GI ROS, Neg liver ROS,   Endo/Other  negative endocrine ROS  Renal/GU negative Renal ROS  negative genitourinary   Musculoskeletal negative musculoskeletal ROS (+)   Abdominal   Peds negative pediatric ROS (+)  Hematology negative hematology ROS (+)   Anesthesia Other Findings   Reproductive/Obstetrics negative OB ROS                            Anesthesia Physical Anesthesia Plan  ASA: II  Anesthesia Plan: General   Post-op Pain Management:    Induction: Intravenous  Airway Management Planned: Oral ETT  Additional Equipment:   Intra-op Plan:   Post-operative Plan: Extubation in OR  Informed Consent: I have reviewed the patients History and Physical, chart, labs and discussed the procedure including the risks, benefits and alternatives for the proposed anesthesia with the patient or authorized representative who has indicated his/her understanding and acceptance.   Dental advisory given  Plan Discussed with: CRNA and Anesthesiologist  Anesthesia Plan Comments: (Interpreter was used for the interview)      Anesthesia Quick Evaluation

## 2016-10-08 NOTE — Op Note (Addendum)
Operative Note    Preoperative Diagnosis: chronic pelvic pain  2. 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 She was then prepped and draped in the normal sterile fashion in the dorsal lithotomy position. An appropriate timeout was performed. A speculum was then placed within the vagina and a Hulka tenaculum placed within the cervix for uterine manipulation. The bladder was emptied. Attention was then turned to the patient's abdomen after draping where the infraumbilical area was injected with approximately 3cc of quarter percent Marcaine. A 5 mm incision was then made within the umbilicus and the optiview trocar and camera easily introduced into the abdominal cavity.Gas flow was then applied and a pneumoperitoneum obtained with approximate 3 L of CO2 gas. Upon entry there were adhesions noted in view. A second 5mm trocar was placed in the patients left lower uterine segment and a better survey done.   With patient in Trendelenburg the uterus and tubes and ovaries were inspected with findings as previously stated.The harmonic scalpel was then used to lyse the adhesions hemostatically from the anterior abdominal wall near the umbilicus.  The adhesions covering the lower uterine segment were also lysed hemostatically. Great mobility of the uterus was noted afterwards in all directions.  The remainder of the pelvis and abdomen were inspected and found to be normal. The instruments were removed from the  abdomen and the pneumoperitoneum reduced through the trocar. The trocars were finally removed. Dermabond was applied to the incisions. The iud was then easily removed from her cervix. No bleeding noted Patient was then awakened and taken to the recovery room in good condition. Counts were all confirmed.

## 2016-10-08 NOTE — Anesthesia Procedure Notes (Signed)
Procedure Name: Intubation Date/Time: 10/08/2016 11:13 AM Performed by: Heather RobertsSINGER, JAMES Pre-anesthesia Checklist: Patient identified, Emergency Drugs available, Suction available, Patient being monitored and Timeout performed Patient Re-evaluated:Patient Re-evaluated prior to inductionOxygen Delivery Method: Circle system utilized Preoxygenation: Pre-oxygenation with 100% oxygen Intubation Type: IV induction Ventilation: Mask ventilation without difficulty Laryngoscope Size: Mac and 3 Grade View: Grade I Tube type: Oral Tube size: 7.0 mm Number of attempts: 1 Airway Equipment and Method: Stylet Placement Confirmation: ETT inserted through vocal cords under direct vision,  positive ETCO2 and breath sounds checked- equal and bilateral Secured at: 20 cm Tube secured with: Tape Dental Injury: Teeth and Oropharynx as per pre-operative assessment

## 2016-10-08 NOTE — Progress Notes (Signed)
Update given to husband via interpretor with physician.

## 2016-10-08 NOTE — Anesthesia Postprocedure Evaluation (Signed)
Anesthesia Post Note  Patient: Nancy Oconnor  Procedure(s) Performed: Procedure(s) (LRB): LAPAROSCOPY DIAGNOSTIC (N/A) LYSIS OF ADHESION (N/A) INTRAUTERINE DEVICE (IUD) REMOVAL (N/A)  Patient location during evaluation: PACU Anesthesia Type: General Level of consciousness: sedated Pain management: pain level controlled Vital Signs Assessment: post-procedure vital signs reviewed and stable Respiratory status: spontaneous breathing and respiratory function stable Cardiovascular status: stable Anesthetic complications: no     Last Vitals:  Vitals:   10/08/16 1245 10/08/16 1300  BP: 99/69 93/64  Pulse: (!) 54 (!) 55  Resp: 16 13  Temp:  36.6 C    Last Pain:  Vitals:   10/08/16 0745  TempSrc: Oral   Pain Goal: Patients Stated Pain Goal: 3 (10/08/16 0745)               Heather RobertsSINGER,Florice Hindle DANIEL

## 2016-10-08 NOTE — Interval H&P Note (Signed)
History and Physical Interval Note:  10/08/2016 10:38 AM  Nancy Oconnor  has presented today for surgery, with the diagnosis of chronic pelvic pain  The various methods of treatment have been discussed with the patient and family. After consideration of risks, benefits and other options for treatment, the patient has consented to  Procedure(s): LAPAROSCOPY DIAGNOSTIC (N/A) LYSIS OF ADHESION (N/A) as a surgical intervention .  The patient's history has been reviewed, patient examined, no change in status, stable for surgery.  I have reviewed the patient's chart and labs.  Questions were answered to the patient's satisfaction.  Pt also requests removal of mirena IUD  - she would prefer to return to use of ocps. Consent verified with interpreter present   Edwinna Areolaecilia Worema Tracia Lacomb

## 2016-10-08 NOTE — Transfer of Care (Signed)
Immediate Anesthesia Transfer of Care Note  Patient: Maryfrances BunnellNada Canterbury  Procedure(s) Performed: Procedure(s): LAPAROSCOPY DIAGNOSTIC (N/A) LYSIS OF ADHESION (N/A) INTRAUTERINE DEVICE (IUD) REMOVAL (N/A)  Patient Location: PACU  Anesthesia Type:General  Level of Consciousness: awake, alert  and oriented  Airway & Oxygen Therapy: Patient Spontanous Breathing and Patient connected to nasal cannula oxygen  Post-op Assessment: Report given to RN, Post -op Vital signs reviewed and stable and Patient moving all extremities X 4  Post vital signs: Reviewed and stable  Last Vitals:  Vitals:   10/08/16 0745  BP: 111/75  Pulse: 87  Resp: 16  Temp: 36.6 C    Last Pain:  Vitals:   10/08/16 0745  TempSrc: Oral      Patients Stated Pain Goal: 3 (10/08/16 0745)  Complications: No apparent anesthesia complications

## 2016-10-08 NOTE — Discharge Instructions (Signed)
Post Anesthesia Home Care Instructions  Activity: Get plenty of rest for the remainder of the day. A responsible adult should stay with you for 24 hours following the procedure.  For the next 24 hours, DO NOT: -Drive a car -Operate machinery -Drink alcoholic beverages -Take any medication unless instructed by your physician -Make any legal decisions or sign important papers.  Meals: Start with liquid foods such as gelatin or soup. Progress to regular foods as tolerated. Avoid greasy, spicy, heavy foods. If nausea and/or vomiting occur, drink only clear liquids until the nausea and/or vomiting subsides. Call your physician if vomiting continues.  Special Instructions/Symptoms: Your throat may feel dry or sore from the anesthesia or the breathing tube placed in your throat during surgery. If this causes discomfort, gargle with warm salt water. The discomfort should disappear within 24 hours.  If you had a scopolamine patch placed behind your ear for the management of post- operative nausea and/or vomiting:  1. The medication in the patch is effective for 72 hours, after which it should be removed.  Wrap patch in a tissue and discard in the trash. Wash hands thoroughly with soap and water. 2. You may remove the patch earlier than 72 hours if you experience unpleasant side effects which may include dry mouth, dizziness or visual disturbances. 3. Avoid touching the patch. Wash your hands with soap and water after contact with the patch.   DISCHARGE INSTRUCTIONS: Laparoscopy  The following instructions have been prepared to help you care for yourself upon your return home today.  Wound care: . Do not get the incision wet for the first 24 hours. The incision should be kept clean and dry. . The Band-Aids or dressings may be removed the day after surgery. . Should the incision become sore, red, and swollen after the first week, check with your doctor.  Personal hygiene: . Shower the day  after your procedure.  Activity and limitations: . Do NOT drive or operate any equipment today. . Do NOT lift anything more than 15 pounds for 2-3 weeks after surgery. . Do NOT rest in bed all day. . Walking is encouraged. Walk each day, starting slowly with 5-minute walks 3 or 4 times a day. Slowly increase the length of your walks. . Walk up and down stairs slowly. . Do NOT do strenuous activities, such as golfing, playing tennis, bowling, running, biking, weight lifting, gardening, mowing, or vacuuming for 2-4 weeks. Ask your doctor when it is okay to start.  Diet: Eat a light meal as desired this evening. You may resume your usual diet tomorrow.  Return to work: This is dependent on the type of work you do. For the most part you can return to a desk job within a week of surgery. If you are more active at work, please discuss this with your doctor.  What to expect after your surgery: You may have a slight burning sensation when you urinate on the first day. You may have a very small amount of blood in the urine. Expect to have a small amount of vaginal discharge/light bleeding for 1-2 weeks. It is not unusual to have abdominal soreness and bruising for up to 2 weeks. You may be tired and need more rest for about 1 week. You may experience shoulder pain for 24-72 hours. Lying flat in bed may relieve it.  Call your doctor for any of the following: . Develop a fever of 100.4 or greater . Inability to urinate 6 hours after discharge   from hospital  Severe pain not relieved by pain medications  Persistent of heavy bleeding at incision site  Redness or swelling around incision site after a week  Increasing nausea or vomiting  Patient Signature________________________________________ Nurse Signature_________________________________________Nothing in vagina for 6 weeks.  No sex, tampons, and douching.

## 2016-10-09 ENCOUNTER — Encounter (HOSPITAL_COMMUNITY): Payer: Self-pay | Admitting: Obstetrics and Gynecology

## 2016-11-22 ENCOUNTER — Ambulatory Visit (HOSPITAL_COMMUNITY)
Admission: EM | Admit: 2016-11-22 | Discharge: 2016-11-22 | Disposition: A | Discharge status: Home / Self Care | Payer: Medicaid Other | Attending: Emergency Medicine | Admitting: Emergency Medicine

## 2016-11-22 ENCOUNTER — Encounter (HOSPITAL_COMMUNITY): Payer: Self-pay | Admitting: *Deleted

## 2016-11-22 DIAGNOSIS — J00 Acute nasopharyngitis [common cold]: Secondary | ICD-10-CM | POA: Diagnosis not present

## 2016-11-22 DIAGNOSIS — J029 Acute pharyngitis, unspecified: Secondary | ICD-10-CM | POA: Diagnosis not present

## 2016-11-22 NOTE — ED Provider Notes (Signed)
CSN: [id number]     Arrival date & time 11/22/16  1553 History   First MD Initiated Contact with Patient 11/22/16 1707     Chief Complaint  Patient presents with  . Sore Throat   (Consider location/radiation/quality/duration/timing/severity/associated sxs/prior Treatment) 30 year old female complaining of a headache and sore throat for 4 days. Yesterday she thought she had a fever but did not measure it. Today she is complaining of nasal congestion, runny nose, cough.      Past Medical History:  Diagnosis Date  . Chest pain    limited exertion for past 4 days  . Chills   . Dry cough    2 years  . Dyspnea   . Headache   . Nasal drainage   . PID (pelvic inflammatory disease)    Past Surgical History:  Procedure Laterality Date  . CESAREAN SECTION    . IUD REMOVAL N/A 10/08/2016   Procedure: INTRAUTERINE DEVICE (IUD) REMOVAL;  Surgeon: Edwinna Areolaecilia Worema Banga, DO;  Location: [facility];  Service: Gynecology;  Laterality: N/A;  . LAPAROSCOPY N/A 10/08/2016   Procedure: LAPAROSCOPY DIAGNOSTIC;  Surgeon: Edwinna Areolaecilia Worema Banga, DO;  Location: [facility];  Service: Gynecology;  Laterality: N/A;  . LYSIS OF ADHESION N/A 10/08/2016   Procedure: LYSIS OF ADHESION;  Surgeon: Edwinna Areolaecilia Worema Banga, DO;  Location: [facility];  Service: Gynecology;  Laterality: N/A;   Family History  Problem Relation Age of Onset  . Hypertension Mother   . Hypertension Father    Social History  Substance Use Topics  . Smoking status: Never Smoker  . Smokeless tobacco: Never Used  . Alcohol use No   OB History    Gravida Para Term Preterm AB Living   1 1 1          SAB TAB Ectopic Multiple Live Births                 Review of Systems  Constitutional: Positive for activity change and fever. Negative for appetite change, chills and fatigue.  HENT: Positive for congestion, postnasal drip, rhinorrhea and sore throat. Negative for ear pain and facial swelling.   Eyes: Negative.   Respiratory: Positive for cough.  Negative for shortness of breath.   Cardiovascular: Negative.   Gastrointestinal: Negative.   Musculoskeletal: Negative for neck pain and neck stiffness.  Skin: Negative for pallor and rash.  Neurological: Negative.     Allergies  Other  Home Medications   Prior to Admission medications   Medication Sig Start Date End Date Taking? Authorizing Provider  ibuprofen (ADVIL,MOTRIN) 600 MG tablet Take 1 tablet (600 mg total) by mouth every 6 (six) hours as needed. 10/08/16   Cecilia Worema Banga, DO  norgestimate-ethinyl estradiol (SPRINTEC 28) 0.25-35 MG-MCG tablet Take 1 tablet by mouth daily. 10/08/16   Cecilia Worema Banga, DO  oxyCODONE-acetaminophen (PERCOCET) 5-325 MG tablet Take 1 tablet by mouth every 6 (six) hours as needed for severe pain. 10/08/16   Cecilia Worema Banga, DO   Meds Ordered and Administered this Visit  Medications - No data to display  BP 120/83 (BP Location: Right Arm)   Pulse 88   Temp (!) 95.3 F (35.2 C) (Oral) Comment: reported temperature to tony  Resp 16   LMP 11/11/2016   SpO2 100%  No data found.   Physical Exam  Constitutional: She is oriented to person, place, and time. She appears well-developed and well-nourished. No distress.  HENT:  Head: Normocephalic.  Right Ear: External ear normal.  Left Ear: External  ear normal.  Mouth/Throat: No oropharyngeal exudate.  Oropharynx with minor erythema and clear PND.Marland Kitchen. No exudates or edema  Bilateral TMs are normal.  Eyes: EOM are normal.  Neck: Normal range of motion. Neck supple.  Cardiovascular: Normal rate, regular rhythm and normal heart sounds.   No murmur heard. Pulmonary/Chest: Effort normal. No respiratory distress. She has no rales.  Musculoskeletal: Normal range of motion.  Lymphadenopathy:    She has no cervical adenopathy.  Neurological: She is alert and oriented to person, place, and time. No cranial nerve deficit.  Skin: Skin is warm and dry.  Psychiatric: She has a normal mood  and affect.  Nursing note and vitals reviewed.   Urgent Care Course   Clinical Course     Procedures (including critical care time)  Labs Review Labs Reviewed - No data to display  Imaging Review No results found.   Visual Acuity Review  Right Eye Distance:   Left Eye Distance:   Bilateral Distance:    Right Eye Near:   Left Eye Near:    Bilateral Near:         MDM   1. Acute nasopharyngitis   2. Sore throat    Sudafed PE 10 mg every 4 to 6 hours as needed for congestion Allegra or Zyrtec daily as needed for drainage and runny nose. For stronger antihistamine may take Chlor-Trimeton 2 to 4 mg every 4 to 6 hours, may cause drowsiness. Saline nasal spray used frequently. Ibuprofen 600 mg every 6 hours as needed for pain, discomfort or fever. Drink plenty of fluids and stay well-hydrated.     Hayden Rasmussenavid Mehreen Azizi, NP 11/22/16 [id number]

## 2016-11-22 NOTE — ED Triage Notes (Signed)
Pt  Reports  4  Days  Of  Fever     sorethroat  And  Cough

## 2016-11-22 NOTE — Discharge Instructions (Signed)
Sudafed PE 10 mg every 4 to 6 hours as needed for congestion °Allegra or Zyrtec daily as needed for drainage and runny nose. °For stronger antihistamine may take Chlor-Trimeton 2 to 4 mg every 4 to 6 hours, may cause drowsiness. °Saline nasal spray used frequently. °Ibuprofen 600 mg every 6 hours as needed for pain, discomfort or fever. °Drink plenty of fluids and stay well-hydrated. °

## 2017-04-22 ENCOUNTER — Emergency Department (HOSPITAL_COMMUNITY): Payer: Medicaid Other

## 2017-04-22 ENCOUNTER — Encounter (HOSPITAL_COMMUNITY): Payer: Self-pay | Admitting: Emergency Medicine

## 2017-04-22 ENCOUNTER — Emergency Department (HOSPITAL_COMMUNITY)
Admission: EM | Admit: 2017-04-22 | Discharge: 2017-04-22 | Disposition: A | Discharge status: Home / Self Care | Payer: Medicaid Other | Attending: Emergency Medicine | Admitting: Emergency Medicine

## 2017-04-22 DIAGNOSIS — R52 Pain, unspecified: Secondary | ICD-10-CM

## 2017-04-22 DIAGNOSIS — M791 Myalgia: Secondary | ICD-10-CM | POA: Insufficient documentation

## 2017-04-22 DIAGNOSIS — R109 Unspecified abdominal pain: Secondary | ICD-10-CM | POA: Diagnosis not present

## 2017-04-22 LAB — URINALYSIS, ROUTINE W REFLEX MICROSCOPIC
BILIRUBIN URINE: NEGATIVE
Glucose, UA: NEGATIVE mg/dL
KETONES UR: NEGATIVE mg/dL
Nitrite: NEGATIVE
PROTEIN: NEGATIVE mg/dL
Specific Gravity, Urine: 1.015 (ref 1.005–1.030)
pH: 6 (ref 5.0–8.0)

## 2017-04-22 LAB — COMPREHENSIVE METABOLIC PANEL
ALBUMIN: 3.8 g/dL (ref 3.5–5.0)
ALK PHOS: 64 U/L (ref 38–126)
ALT: 11 U/L — ABNORMAL LOW (ref 14–54)
ANION GAP: 9 (ref 5–15)
AST: 19 U/L (ref 15–41)
BUN: 6 mg/dL (ref 6–20)
CALCIUM: 8.6 mg/dL — AB (ref 8.9–10.3)
CHLORIDE: 106 mmol/L (ref 101–111)
CO2: 22 mmol/L (ref 22–32)
Creatinine, Ser: 0.62 mg/dL (ref 0.44–1.00)
GFR calc Af Amer: >60 mL/min (ref >60)
GFR calc non Af Amer: >60 mL/min (ref >60)
GLUCOSE: 94 mg/dL (ref 65–99)
Potassium: 3.8 mmol/L (ref 3.5–5.1)
SODIUM: 137 mmol/L (ref 135–145)
Total Bilirubin: 0.6 mg/dL (ref 0.3–1.2)
Total Protein: 6.9 g/dL (ref 6.5–8.1)

## 2017-04-22 LAB — CBC
HEMATOCRIT: 40.9 % (ref 36.0–46.0)
HEMOGLOBIN: 13 g/dL (ref 12.0–15.0)
MCH: 28.5 pg (ref 26.0–34.0)
MCHC: 31.8 g/dL (ref 30.0–36.0)
MCV: 89.7 fL (ref 78.0–100.0)
Platelets: 227 10*3/uL (ref 150–400)
RBC: 4.56 MIL/uL (ref 3.87–5.11)
RDW: 13.6 % (ref 11.5–15.5)
WBC: 10 10*3/uL (ref 4.0–10.5)

## 2017-04-22 LAB — I-STAT BETA HCG BLOOD, ED (MC, WL, AP ONLY): I-stat hCG, quantitative: <5 m[IU]/mL (ref <5)

## 2017-04-22 LAB — LIPASE, BLOOD: LIPASE: 32 U/L (ref 11–51)

## 2017-04-22 MED ORDER — ONDANSETRON HCL 4 MG/2ML IJ SOLN
4.0000 mg | Freq: Once | INTRAMUSCULAR | Status: AC
  Administered 2017-04-22: 4 mg via INTRAVENOUS
  Filled 2017-04-22: qty 2

## 2017-04-22 MED ORDER — KETOROLAC TROMETHAMINE 30 MG/ML IJ SOLN
30.0000 mg | Freq: Once | INTRAMUSCULAR | Status: AC
  Administered 2017-04-22: 30 mg via INTRAVENOUS
  Filled 2017-04-22: qty 1

## 2017-04-22 MED ORDER — SODIUM CHLORIDE 0.9 % IV BOLUS (SEPSIS)
1000.0000 mL | Freq: Once | INTRAVENOUS | Status: AC
  Administered 2017-04-22: 1000 mL via INTRAVENOUS

## 2017-04-22 NOTE — ED Triage Notes (Signed)
Pt c/o generalized body ache and nausea. Pt denies exposure to others with illness.

## 2017-04-22 NOTE — ED Notes (Signed)
Patient tolerating oral intake 

## 2017-04-22 NOTE — ED Provider Notes (Signed)
[facility]ENCY DEPT Provider Note   CSN: [id number] Arrival date & time: 04/22/17  0449     History   Chief Complaint Chief Complaint  Patient presents with  . Nausea  . Generalized Body Aches    HPI Nancy Oconnor is a 31 y.o. female.  HPI   Pt is a 31 yo female with no pertinent past medical hx who presents to the ED with multiple complaints. Pt reports since around 8pm last night she had not been feeling well. Reports having generalized body aches but reports having constant pain to her left flank, denies radiation. Denies aggravating or alleviating factors. Endorses associated chills and nausea. Denies taking any meds at home for her sxs. Denies any known sick contacts. Endorses associated dry cough. Denies fever, HA, sore throat, rhinorrhea, sinus pressure/pain, CP, SOB, wheezing, abdominal pain, vomiting, diarrhea, constipation, urinary sxs, blood in urine or stool, vaginal bleeding. Pt reports she is currently on antibiotics started by her PCP for vaginal d/c. Denies hx of kidney stones. LMP appx. 2 weeks ago.   Past Medical History:  Diagnosis Date  . Chest pain    limited exertion for past 4 days  . Chills   . Dry cough    2 years  . Dyspnea   . Headache   . Nasal drainage   . PID (pelvic inflammatory disease)     Patient Active Problem List   Diagnosis Date Noted  . Complicated UTI (urinary tract infection) 03/10/2015  . PID (acute pelvic inflammatory disease) 03/10/2015  . Nausea with vomiting     Past Surgical History:  Procedure Laterality Date  . CESAREAN SECTION    . IUD REMOVAL N/A 10/08/2016   Procedure: INTRAUTERINE DEVICE (IUD) REMOVAL;  Surgeon: Edwinna Areolaecilia Worema Banga, DO;  Location: [facility];  Service: Gynecology;  Laterality: N/A;  . LAPAROSCOPY N/A 10/08/2016   Procedure: LAPAROSCOPY DIAGNOSTIC;  Surgeon: Edwinna Areolaecilia Worema Banga, DO;  Location: [facility];  Service: Gynecology;  Laterality: N/A;  . LYSIS OF ADHESION N/A 10/08/2016   Procedure: LYSIS OF  ADHESION;  Surgeon: Edwinna Areolaecilia Worema Banga, DO;  Location: [facility];  Service: Gynecology;  Laterality: N/A;    OB History    Gravida Para Term Preterm AB Living   1 1 1          SAB TAB Ectopic Multiple Live Births                   Home Medications    Prior to Admission medications   Medication Sig Start Date End Date Taking? Authorizing Provider  ibuprofen (ADVIL,MOTRIN) 600 MG tablet Take 1 tablet (600 mg total) by mouth every 6 (six) hours as needed. 10/08/16   Edwinna AreolaBanga, Cecilia Worema, DO  norgestimate-ethinyl estradiol (SPRINTEC 28) 0.25-35 MG-MCG tablet Take 1 tablet by mouth daily. 10/08/16   Edwinna AreolaBanga, Cecilia Worema, DO  oxyCODONE-acetaminophen (PERCOCET) 5-325 MG tablet Take 1 tablet by mouth every 6 (six) hours as needed for severe pain. 10/08/16   Edwinna AreolaBanga, Cecilia Worema, DO    Family History Family History  Problem Relation Age of Onset  . Hypertension Mother   . Hypertension Father     Social History Social History  Substance Use Topics  . Smoking status: Never Smoker  . Smokeless tobacco: Never Used  . Alcohol use No     Allergies   Other   Review of Systems Review of Systems  Constitutional: Positive for chills.  Respiratory: Positive for cough.   Gastrointestinal: Positive for nausea.  Genitourinary: Positive  for flank pain (left).  Musculoskeletal: Positive for myalgias (generalized).  All other systems reviewed and are negative.    Physical Exam Updated Vital Signs BP 111/77 (BP Location: Right Arm)   Pulse 65   Temp 98.1 F (36.7 C) (Oral)   Resp 14   Ht 5\' 3"  (1.6 m)   Wt 61.2 kg   LMP 04/09/2017   SpO2 100%   BMI 23.91 kg/m   Physical Exam  Constitutional: She is oriented to person, place, and time. She appears well-developed and well-nourished. No distress.  HENT:  Head: Normocephalic and atraumatic.  Mouth/Throat: Oropharynx is clear and moist. No oropharyngeal exudate.  Eyes: Conjunctivae and EOM are normal. Right eye exhibits no  discharge. Left eye exhibits no discharge. No scleral icterus.  Neck: Normal range of motion. Neck supple.  Cardiovascular: Normal rate, regular rhythm, normal heart sounds and intact distal pulses.   Pulmonary/Chest: Effort normal and breath sounds normal. No respiratory distress. She has no wheezes. She has no rales. She exhibits no tenderness.  Abdominal: Soft. Normal appearance and bowel sounds are normal. She exhibits no distension and no mass. There is no tenderness. There is CVA tenderness (left). There is no rigidity, no rebound and no guarding. No hernia.  Musculoskeletal: Normal range of motion. She exhibits no edema.  Lymphadenopathy:    She has no cervical adenopathy.  Neurological: She is alert and oriented to person, place, and time.  Skin: Skin is warm and dry. She is not diaphoretic.  Nursing note and vitals reviewed.    ED Treatments / Results  Labs (all labs ordered are listed, but only abnormal results are displayed) Labs Reviewed  COMPREHENSIVE METABOLIC PANEL - Abnormal; Notable for the following:       Result Value   Calcium 8.6 (*)    ALT 11 (*)    All other components within normal limits  URINALYSIS, ROUTINE W REFLEX MICROSCOPIC - Abnormal; Notable for the following:    Hgb urine dipstick MODERATE (*)    Leukocytes, UA TRACE (*)    Bacteria, UA RARE (*)    Squamous Epithelial / LPF 0-5 (*)    All other components within normal limits  LIPASE, BLOOD  CBC  I-STAT BETA HCG BLOOD, ED ([facility], [facility], [facility] ONLY)    EKG  EKG Interpretation None       Radiology Ct Renal Stone Study  Result Date: 04/22/2017 CLINICAL DATA:  31 year old female with history of left-sided flank pain for 1 week, worsening today. Some associated vomiting. Prior history of urinary tract calculi. EXAM: CT ABDOMEN AND PELVIS WITHOUT CONTRAST TECHNIQUE: Multidetector CT imaging of the abdomen and pelvis was performed following the standard protocol without IV contrast. COMPARISON:  CT the  abdomen and pelvis 04/22/2016. FINDINGS: Lower chest: Unremarkable. Hepatobiliary: No definite cystic or solid hepatic lesions are identified in the visualized portions of the liver (superior aspect of the liver is incompletely visualized) on today's noncontrast CT examination. Unenhanced appearance of the gallbladder is normal. Pancreas: No definite pancreatic mass or peripancreatic inflammatory changes are noted on today's noncontrast CT examination. Spleen: Unenhanced appearance of the visualized spleen (superior aspect of the spleen is incompletely visualized) is unremarkable. Adrenals/Urinary Tract: There are no abnormal calcifications within the collecting system of either kidney, along the course of either ureter, or within the lumen of the urinary bladder. No hydroureteronephrosis or perinephric stranding to suggest urinary tract obstruction at this time. The unenhanced appearance of the kidneys is unremarkable bilaterally. Unenhanced appearance of the  urinary bladder is normal. Bilateral adrenal glands are normal in appearance. Stomach/Bowel: Unenhanced appearance of the visualized stomach is normal. There is no pathologic dilatation of small bowel or colon. Normal appendix. Vascular/Lymphatic: No atherosclerotic calcifications or definite aneurysm noted in the abdominal or pelvic vasculature. No lymphadenopathy noted in the abdomen or pelvis. Reproductive: Uterus and ovaries are unremarkable in appearance. Other: No significant volume of ascites.  No pneumoperitoneum. Musculoskeletal: There are no aggressive appearing lytic or blastic lesions noted in the visualized portions of the skeleton. IMPRESSION: 1. No acute findings are noted in the abdomen or pelvis to account for the patient's symptoms. Specifically, no urinary tract calculi no findings of urinary tract obstruction are noted at this time. 2. Normal appendix. Electronically Signed   By: Trudie Reed M.D.   On: 04/22/2017 08:08     Procedures Procedures (including critical care time)  Medications Ordered in ED Medications  sodium chloride 0.9 % bolus 1,000 mL (0 mLs Intravenous Stopped 04/22/17 0748)  ondansetron (ZOFRAN) injection 4 mg (4 mg Intravenous Given 04/22/17 0655)  ketorolac (TORADOL) 30 MG/ML injection 30 mg (30 mg Intravenous Given 04/22/17 0655)     Initial Impression / Assessment and Plan / ED Course  I have reviewed the triage vital signs and the nursing notes.  Pertinent labs & imaging results that were available during my care of the patient were reviewed by me and considered in my medical decision making (see chart for details).     Patient presents with generalized body aches, chills, cough, nausea and left flank pain since last night. Denies fever. Reports currently being on antibiotics for vaginal discharge which has improved. VSS. On exam patient is alert and nontoxic appearing. Moist mucous membranes, clear oropharynx. Lungs clear to auscultation bilaterally. RRR. Abdomen soft and nontender. Left CVA tenderness. Remaining exam unremarkable. Pt given IV and toradol. Pregnancy negative. Labs unremarkable. UA showed moderate hgb with RBCs; trace leuks and rare bacteria, suspect contamination, do not suspect infection as pt with without urinary sxs. CT renal study negative. On repeat exam patient does not have a surgical abdomen and there are no peritoneal signs.  No indication of appendicitis, bowel obstruction, bowel perforation, cholecystitis, diverticulitis, PID or ectopic pregnancy. Pt reports improvement of sxs, tolerating PO. Patient discharged home with symptomatic treatment and given strict instructions for follow-up with their primary care physician.  I have also discussed reasons to return immediately to the ER.  Patient expresses understanding and agrees with plan.     Final Clinical Impressions(s) / ED Diagnoses   Final diagnoses:  Body aches  Flank pain    New  Prescriptions Discharge Medication List as of 04/22/2017  9:14 AM       Barrett Henle, PA-C 04/22/17 0919    Gilda Crease, MD 04/25/17 [phone]

## 2017-04-22 NOTE — ED Notes (Signed)
Patient given water for PO challenge.  

## 2017-04-22 NOTE — Discharge Instructions (Signed)
He may continue taking Tylenol and ibuprofen as prescribed over-the-counter, alternating between doses every 3-4 hours as needed for pain relief. Continue drinking fluids at home to remain hydrated. Continue taking your antibiotic for your vaginal discharge as prescribed until completed. Follow-up with your primary care provider in the next 2-3 days if your symptoms have not improved. Please return to the Emergency Department if symptoms worsen or new onset of fever, chest pain, difficulty breathing, abdominal pain, vomiting, unable to keep fluids down, blood in urine or stool, vaginal bleeding.

## 2017-05-20 ENCOUNTER — Emergency Department (HOSPITAL_COMMUNITY)
Admission: EM | Admit: 2017-05-20 | Discharge: 2017-05-20 | Disposition: A | Discharge status: Home / Self Care | Payer: Medicaid Other | Attending: Emergency Medicine | Admitting: Emergency Medicine

## 2017-05-20 ENCOUNTER — Encounter (HOSPITAL_COMMUNITY): Payer: Self-pay | Admitting: *Deleted

## 2017-05-20 ENCOUNTER — Emergency Department (HOSPITAL_COMMUNITY): Payer: Medicaid Other

## 2017-05-20 DIAGNOSIS — R079 Chest pain, unspecified: Secondary | ICD-10-CM | POA: Diagnosis present

## 2017-05-20 DIAGNOSIS — R091 Pleurisy: Secondary | ICD-10-CM | POA: Diagnosis not present

## 2017-05-20 LAB — LIPASE, BLOOD: LIPASE: 24 U/L (ref 11–51)

## 2017-05-20 LAB — URINALYSIS, ROUTINE W REFLEX MICROSCOPIC
BILIRUBIN URINE: NEGATIVE
Bacteria, UA: NONE SEEN
GLUCOSE, UA: NEGATIVE mg/dL
KETONES UR: NEGATIVE mg/dL
LEUKOCYTES UA: NEGATIVE
NITRITE: NEGATIVE
PH: 6 (ref 5.0–8.0)
PROTEIN: NEGATIVE mg/dL
Specific Gravity, Urine: 1.02 (ref 1.005–1.030)

## 2017-05-20 LAB — CBC
HCT: 38.7 % (ref 36.0–46.0)
HEMOGLOBIN: 12.2 g/dL (ref 12.0–15.0)
MCH: 28 pg (ref 26.0–34.0)
MCHC: 31.5 g/dL (ref 30.0–36.0)
MCV: 89 fL (ref 78.0–100.0)
Platelets: 213 10*3/uL (ref 150–400)
RBC: 4.35 MIL/uL (ref 3.87–5.11)
RDW: 13.5 % (ref 11.5–15.5)
WBC: 7.2 10*3/uL (ref 4.0–10.5)

## 2017-05-20 LAB — BASIC METABOLIC PANEL
ANION GAP: 6 (ref 5–15)
BUN: 7 mg/dL (ref 6–20)
CALCIUM: 8.7 mg/dL — AB (ref 8.9–10.3)
CHLORIDE: 107 mmol/L (ref 101–111)
CO2: 24 mmol/L (ref 22–32)
Creatinine, Ser: 0.63 mg/dL (ref 0.44–1.00)
GFR calc Af Amer: >60 mL/min (ref >60)
GFR calc non Af Amer: >60 mL/min (ref >60)
GLUCOSE: 105 mg/dL — AB (ref 65–99)
Potassium: 3.7 mmol/L (ref 3.5–5.1)
Sodium: 137 mmol/L (ref 135–145)

## 2017-05-20 LAB — HEPATIC FUNCTION PANEL
ALT: 11 U/L — ABNORMAL LOW (ref 14–54)
AST: 17 U/L (ref 15–41)
Albumin: 3.8 g/dL (ref 3.5–5.0)
Alkaline Phosphatase: 67 U/L (ref 38–126)
BILIRUBIN TOTAL: 0.5 mg/dL (ref 0.3–1.2)
Total Protein: 6.9 g/dL (ref 6.5–8.1)

## 2017-05-20 LAB — I-STAT TROPONIN, ED: TROPONIN I, POC: 0 ng/mL (ref 0.00–0.08)

## 2017-05-20 LAB — I-STAT BETA HCG BLOOD, ED (MC, WL, AP ONLY): I-stat hCG, quantitative: <5 m[IU]/mL (ref <5)

## 2017-05-20 MED ORDER — ETODOLAC 300 MG PO CAPS: 300.0000 mg | ORAL_CAPSULE | Freq: Three times a day (TID) | ORAL | 0 refills | Status: DC

## 2017-05-20 NOTE — Discharge Instructions (Signed)
Take medications as needed for pain. Follow-up with your primary care doctor next week to make sure your symptoms are improving

## 2017-05-20 NOTE — ED Triage Notes (Signed)
C/o chest pain and left arm pain onset 1 week ago , states she was seen by her MD for same

## 2017-05-20 NOTE — ED Provider Notes (Signed)
[facility]-EM[facility]Y DEPT Provider Note   CSN: [id number] Arrival date & time: 05/20/17  1050  By signing my name below, I, Cynda AcresHailei Fulton, attest that this documentation has been prepared under the direction and in the presence of Linwood DibblesKnapp, Amerika Nourse, MD. Electronically Signed: Cynda AcresHailei Fulton, Scribe. 05/20/17. 12:40 PM.  History   Chief Complaint Chief Complaint  Patient presents with  . Chest Pain    HPI Comments: Nancy Oconnor is a 31 y.o. female with a history of chest pain, who presents to the Emergency Department complaining of sudden-onset, constant left-sided chest pain that began one week ago. Patient repots gradually worsening chest pain for several days. Patient reports being seen by her primary provider two days ago. Patient states her pain is worse with deep inspiration and palpation. No modifying factors indicated. Patient denies any fever, chills, cough, shortness of breath, diaphoresis, or any additional symptoms.   Patient is also complaining of abdominal pain that began two months ago. No additional symptoms noted. Patient denies any nausea, vomiting, diarrhea, vaginal discharge, urinary frequency, dysuria, or hematuria.   The history is provided by the patient. No language interpreter was used.    Past Medical History:  Diagnosis Date  . Chest pain    limited exertion for past 4 days  . Chills   . Dry cough    2 years  . Dyspnea   . Headache   . Nasal drainage   . PID (pelvic inflammatory disease)     Patient Active Problem List   Diagnosis Date Noted  . Complicated UTI (urinary tract infection) 03/10/2015  . PID (acute pelvic inflammatory disease) 03/10/2015  . Nausea with vomiting     Past Surgical History:  Procedure Laterality Date  . CESAREAN SECTION    . IUD REMOVAL N/A 10/08/2016   Procedure: INTRAUTERINE DEVICE (IUD) REMOVAL;  Surgeon: Edwinna Areolaecilia Worema Banga, DO;  Location: [facility];  Service: Gynecology;  Laterality: N/A;  . LAPAROSCOPY N/A 10/08/2016   Procedure: LAPAROSCOPY DIAGNOSTIC;  Surgeon: Edwinna Areolaecilia Worema Banga, DO;  Location: [facility];  Service: Gynecology;  Laterality: N/A;  . LYSIS OF ADHESION N/A 10/08/2016   Procedure: LYSIS OF ADHESION;  Surgeon: Edwinna Areolaecilia Worema Banga, DO;  Location: [facility];  Service: Gynecology;  Laterality: N/A;    OB History    Gravida Para Term Preterm AB Living   1 1 1          SAB TAB Ectopic Multiple Live Births                   Home Medications    Prior to Admission medications   Medication Sig Start Date End Date Taking? Authorizing Provider  etodolac (LODINE) 300 MG capsule Take 1 capsule (300 mg total) by mouth every 8 (eight) hours. 05/20/17   Linwood DibblesKnapp, Axie Hayne, MD  norgestimate-ethinyl estradiol (SPRINTEC 28) 0.25-35 MG-MCG tablet Take 1 tablet by mouth daily. 10/08/16   Edwinna AreolaBanga, Cecilia Worema, DO  oxyCODONE-acetaminophen (PERCOCET) 5-325 MG tablet Take 1 tablet by mouth every 6 (six) hours as needed for severe pain. 10/08/16   Edwinna AreolaBanga, Cecilia Worema, DO    Family History Family History  Problem Relation Age of Onset  . Hypertension Mother   . Hypertension Father     Social History Social History  Substance Use Topics  . Smoking status: Never Smoker  . Smokeless tobacco: Never Used  . Alcohol use No     Allergies   Other   Review of Systems Review of Systems  Constitutional: Negative for chills,  diaphoresis and fever.  Respiratory: Negative for cough and shortness of breath.   Cardiovascular: Positive for chest pain.  Gastrointestinal: Positive for abdominal pain. Negative for diarrhea, nausea and vomiting.  Genitourinary: Negative for dysuria, frequency, hematuria, vaginal discharge and vaginal pain.  All other systems reviewed and are negative.    Physical Exam Updated Vital Signs BP 111/73 (BP Location: Left Arm)   Pulse 72   Temp 97.8 F (36.6 C)   Resp 17   Ht 1.575 m (5\' 2" )   Wt 64.4 kg (142 lb)   LMP 05/07/2017   SpO2 100%   BMI 25.97 kg/m   Physical Exam    Constitutional: She appears well-developed and well-nourished. No distress.  HENT:  Head: Normocephalic and atraumatic.  Right Ear: External ear normal.  Left Ear: External ear normal.  Eyes: Conjunctivae are normal. Right eye exhibits no discharge. Left eye exhibits no discharge. No scleral icterus.  Neck: Neck supple. No tracheal deviation present.  Cardiovascular: Normal rate, regular rhythm and intact distal pulses.   Pulmonary/Chest: Effort normal and breath sounds normal. No stridor. No respiratory distress. She has no wheezes. She has no rales. She exhibits tenderness.  Left anterior chest tenderness to palpation.   Abdominal: Soft. Bowel sounds are normal. She exhibits no distension. There is tenderness. There is no rebound and no guarding.  Generalized no focal areas.   Musculoskeletal: She exhibits no edema or tenderness.  Neurological: She is alert. She has normal strength. No cranial nerve deficit (no facial droop, extraocular movements intact, no slurred speech) or sensory deficit. She exhibits normal muscle tone. She displays no seizure activity. Coordination normal.  Skin: Skin is warm and dry. No rash noted.  Psychiatric: She has a normal mood and affect.  Nursing note and vitals reviewed.    ED Treatments / Results  DIAGNOSTIC STUDIES: Oxygen Saturation is 100% on RA, normal by my interpretation.    COORDINATION OF CARE: 12:38 PM Discussed treatment plan with pt at bedside and pt agreed to plan, which includes imaging.   Labs (all labs ordered are listed, but only abnormal results are displayed) Labs Reviewed  BASIC METABOLIC PANEL - Abnormal; Notable for the following:       Result Value   Glucose, Bld 105 (*)    Calcium 8.7 (*)    All other components within normal limits  URINALYSIS, ROUTINE W REFLEX MICROSCOPIC - Abnormal; Notable for the following:    Hgb urine dipstick SMALL (*)    Squamous Epithelial / LPF 0-5 (*)    All other components within normal  limits  HEPATIC FUNCTION PANEL - Abnormal; Notable for the following:    ALT 11 (*)    Bilirubin, Direct <0.1 (*)    All other components within normal limits  CBC  LIPASE, BLOOD  I-STAT TROPOININ, ED  I-STAT BETA HCG BLOOD, ED ([facility], [facility], [facility] ONLY)    EKG  EKG Interpretation  Date/Time:  Monday May 20 2017 10:55:14 EDT Ventricular Rate:  73 PR Interval:  142 QRS Duration: 82 QT Interval:  390 QTC Calculation: 429 R Axis:   33 Text Interpretation:  Normal sinus rhythm Septal infarct , age undetermined Abnormal ECG No significant change since last tracing 1 minute earlier Confirmed by Linwood Dibbles [phone]) on 05/20/2017 12:57:56 PM       Radiology Dg Chest 2 View  Result Date: 05/20/2017 CLINICAL DATA:  Chest pain for 3 days. EXAM: CHEST  2 VIEW COMPARISON:  Prior chest x-ray on 06/24/2016  as well as frontal chest radiograph from abdominal series on 09/06/2016. FINDINGS: The heart size and mediastinal contours are within normal limits. There is no evidence of pulmonary edema, consolidation, pneumothorax, nodule or pleural fluid. The visualized skeletal structures are unremarkable. IMPRESSION: No active cardiopulmonary disease. Electronically Signed   By: Irish Lack M.D.   On: 05/20/2017 11:45    Procedures Procedures (including critical care time)  Medications Ordered in ED Medications - No data to display   Initial Impression / Assessment and Plan / ED Course  I have reviewed the triage vital signs and the nursing notes.  Pertinent labs & imaging results that were available during my care of the patient were reviewed by me and considered in my medical decision making (see chart for details).  Patient presents to the emergency room with several complaints. Her primary issue however his chest pain. She has chest wall tenderness in the pain increases with breathing. Patient has no history of PE or DVT. She is perC  Negative.  Normal lfts and lipase.  Doubt gi  etiology.  Suspect costochondritis or pleurisy.  Dc home with nsaids.  Follow up with PCP  Final Clinical Impressions(s) / ED Diagnoses   Final diagnoses:  Pleurisy    New Prescriptions New Prescriptions   ETODOLAC (LODINE) 300 MG CAPSULE    Take 1 capsule (300 mg total) by mouth every 8 (eight) hours.   I personally performed the services described in this documentation, which was scribed in my presence.  The recorded information has been reviewed and is accurate.      Linwood Dibbles, MD 05/20/17 [phone]

## 2017-05-26 ENCOUNTER — Emergency Department (HOSPITAL_COMMUNITY)
Admission: EM | Admit: 2017-05-26 | Discharge: 2017-05-26 | Disposition: A | Discharge status: Home / Self Care | Payer: Medicaid Other | Attending: Emergency Medicine | Admitting: Emergency Medicine

## 2017-05-26 ENCOUNTER — Emergency Department (HOSPITAL_COMMUNITY): Payer: Medicaid Other

## 2017-05-26 ENCOUNTER — Other Ambulatory Visit: Payer: Self-pay

## 2017-05-26 ENCOUNTER — Encounter (HOSPITAL_COMMUNITY): Payer: Self-pay | Admitting: Emergency Medicine

## 2017-05-26 DIAGNOSIS — R071 Chest pain on breathing: Secondary | ICD-10-CM | POA: Insufficient documentation

## 2017-05-26 DIAGNOSIS — M5442 Lumbago with sciatica, left side: Secondary | ICD-10-CM | POA: Diagnosis not present

## 2017-05-26 DIAGNOSIS — M5440 Lumbago with sciatica, unspecified side: Secondary | ICD-10-CM

## 2017-05-26 DIAGNOSIS — R079 Chest pain, unspecified: Secondary | ICD-10-CM

## 2017-05-26 DIAGNOSIS — N9489 Other specified conditions associated with female genital organs and menstrual cycle: Secondary | ICD-10-CM | POA: Diagnosis not present

## 2017-05-26 LAB — URINALYSIS, ROUTINE W REFLEX MICROSCOPIC
Bilirubin Urine: NEGATIVE
GLUCOSE, UA: NEGATIVE mg/dL
KETONES UR: NEGATIVE mg/dL
Leukocytes, UA: NEGATIVE
Nitrite: NEGATIVE
PROTEIN: NEGATIVE mg/dL
Specific Gravity, Urine: 1.017 (ref 1.005–1.030)
pH: 6 (ref 5.0–8.0)

## 2017-05-26 LAB — CBC
HCT: 40.2 % (ref 36.0–46.0)
Hemoglobin: 12.8 g/dL (ref 12.0–15.0)
MCH: 28.6 pg (ref 26.0–34.0)
MCHC: 31.8 g/dL (ref 30.0–36.0)
MCV: 89.9 fL (ref 78.0–100.0)
PLATELETS: 230 10*3/uL (ref 150–400)
RBC: 4.47 MIL/uL (ref 3.87–5.11)
RDW: 13.6 % (ref 11.5–15.5)
WBC: 8.8 10*3/uL (ref 4.0–10.5)

## 2017-05-26 LAB — BASIC METABOLIC PANEL
Anion gap: 7 (ref 5–15)
CALCIUM: 8.9 mg/dL (ref 8.9–10.3)
CO2: 24 mmol/L (ref 22–32)
Chloride: 108 mmol/L (ref 101–111)
Creatinine, Ser: 0.71 mg/dL (ref 0.44–1.00)
GFR calc Af Amer: >60 mL/min (ref >60)
GFR calc non Af Amer: >60 mL/min (ref >60)
GLUCOSE: 99 mg/dL (ref 65–99)
Potassium: 3.6 mmol/L (ref 3.5–5.1)
Sodium: 139 mmol/L (ref 135–145)

## 2017-05-26 LAB — I-STAT TROPONIN, ED: TROPONIN I, POC: 0 ng/mL (ref 0.00–0.08)

## 2017-05-26 LAB — LIPASE, BLOOD: Lipase: 32 U/L (ref 11–51)

## 2017-05-26 LAB — D-DIMER, QUANTITATIVE (NOT AT ARMC)

## 2017-05-26 LAB — HCG, QUANTITATIVE, PREGNANCY: hCG, Beta Chain, Quant, S: <1 m[IU]/mL (ref <5)

## 2017-05-26 MED ORDER — ONDANSETRON 4 MG PO TBDP
4.0000 mg | ORAL_TABLET | Freq: Once | ORAL | Status: AC | PRN
  Administered 2017-05-26: 4 mg via ORAL

## 2017-05-26 MED ORDER — ONDANSETRON 4 MG PO TBDP
ORAL_TABLET | ORAL | Status: AC
  Filled 2017-05-26: qty 1

## 2017-05-26 NOTE — ED Notes (Signed)
Lab called about D Dimer.  They are running it now

## 2017-05-26 NOTE — ED Provider Notes (Signed)
[facility] Provider Note   CSN: [id number] Arrival date & time: 05/26/17  0533     History   Chief Complaint Chief Complaint  Patient presents with  . multiple complaints of pain    HPI Nancy Oconnor is a 31 y.o. female.  Patient is a 31 year old female with no significant past medical history who presents with multiple complaints of pain. She complains of pain in her left chest. She states it's been going on for the last 2-3 weeks. It's worse with movement. Also is worse with deep breathing. She denies any shortness of breath. She has pain in her left midabdomen and left back. She states it radiates down her left leg and hurts down her left leg. She denies any numbness or weakness of the leg. No loss of bowel or bladder function. She's been seen in the ED for similar complaints and also was seen by her PCP. She states her PCP has advised her that there not be continuing to take care of her and she needs to find another PCP. She denies any known fevers. No urinary symptoms. No vaginal bleeding or discharge. She denies any unilateral leg swelling. No recent immobilization. She is on oral contraceptives.      Past Medical History:  Diagnosis Date  . Chest pain    limited exertion for past 4 days  . Chills   . Dry cough    2 years  . Dyspnea   . Headache   . Nasal drainage   . PID (pelvic inflammatory disease)     Patient Active Problem List   Diagnosis Date Noted  . Complicated UTI (urinary tract infection) 03/10/2015  . PID (acute pelvic inflammatory disease) 03/10/2015  . Nausea with vomiting     Past Surgical History:  Procedure Laterality Date  . CESAREAN SECTION    . IUD REMOVAL N/A 10/08/2016   Procedure: INTRAUTERINE DEVICE (IUD) REMOVAL;  Surgeon: Edwinna Areola, DO;  Location: [facility];  Service: Gynecology;  Laterality: N/A;  . LAPAROSCOPY N/A 10/08/2016   Procedure: LAPAROSCOPY DIAGNOSTIC;  Surgeon: Edwinna Areola, DO;  Location: [facility];   Service: Gynecology;  Laterality: N/A;  . LYSIS OF ADHESION N/A 10/08/2016   Procedure: LYSIS OF ADHESION;  Surgeon: Edwinna Areola, DO;  Location: [facility];  Service: Gynecology;  Laterality: N/A;    OB History    Gravida Para Term Preterm AB Living   1 1 1          SAB TAB Ectopic Multiple Live Births                   Home Medications    Prior to Admission medications   Medication Sig Start Date End Date Taking? Authorizing Provider  etodolac (LODINE) 300 MG capsule Take 1 capsule (300 mg total) by mouth every 8 (eight) hours. 05/20/17  Yes Linwood Dibbles, MD  norgestimate-ethinyl estradiol (SPRINTEC 28) 0.25-35 MG-MCG tablet Take 1 tablet by mouth daily. 10/08/16  Yes Banga, Cecilia Worema, DO  oxyCODONE-acetaminophen (PERCOCET) 5-325 MG tablet Take 1 tablet by mouth every 6 (six) hours as needed for severe pain. Patient not taking: Reported on 05/26/2017 10/08/16   Edwinna Areola, DO    Family History Family History  Problem Relation Age of Onset  . Hypertension Mother   . Hypertension Father     Social History Social History  Substance Use Topics  . Smoking status: Never Smoker  . Smokeless tobacco: Never Used  . Alcohol use No  Allergies   Other   Review of Systems Review of Systems  Constitutional: Negative for chills, diaphoresis, fatigue and fever.  HENT: Negative for congestion, rhinorrhea and sneezing.   Eyes: Negative.   Respiratory: Negative for cough, chest tightness and shortness of breath.   Cardiovascular: Positive for chest pain. Negative for leg swelling.  Gastrointestinal: Positive for abdominal pain. Negative for blood in stool, diarrhea, nausea and vomiting.  Genitourinary: Negative for difficulty urinating, flank pain, frequency and hematuria.  Musculoskeletal: Positive for back pain. Negative for arthralgias.  Skin: Negative for rash.  Neurological: Negative for dizziness, speech difficulty, weakness, numbness and headaches.      Physical Exam Updated Vital Signs BP 102/72   Pulse 61   Temp 98 F (36.7 C) (Oral)   Resp 15   Ht 5\' 2"  (1.575 m)   Wt 64.4 kg (142 lb)   LMP 05/07/2017   SpO2 100%   BMI 25.97 kg/m   Physical Exam  Constitutional: She is oriented to person, place, and time. She appears well-developed and well-nourished.  HENT:  Head: Normocephalic and atraumatic.  Eyes: Pupils are equal, round, and reactive to light.  Neck: Normal range of motion. Neck supple.  Cardiovascular: Normal rate, regular rhythm and normal heart sounds.   Pulmonary/Chest: Effort normal and breath sounds normal. No respiratory distress. She has no wheezes. She has no rales. She exhibits tenderness.  Positive reproducible tenderness to the left chest wall, no crepitus or deformity, no rash  Abdominal: Soft. Bowel sounds are normal. There is tenderness. There is no rebound and no guarding.  Positive tenderness in the left midabdomen and diffusely along the left back. There is pain along the left sciatic nerve and on palpation left leg. No swelling of the leg is noted.  Musculoskeletal: Normal range of motion. She exhibits no edema.  No swelling, negative straight leg raise bilaterally. Pedal pulses are intact. She has normal sensation and motor function in the legs  Lymphadenopathy:    She has no cervical adenopathy.  Neurological: She is alert and oriented to person, place, and time.  Skin: Skin is warm and dry. No rash noted.  Psychiatric: She has a normal mood and affect.     ED Treatments / Results  Labs (all labs ordered are listed, but only abnormal results are displayed) Labs Reviewed  BASIC METABOLIC PANEL - Abnormal; Notable for the following:       Result Value   BUN <5 (*)    All other components within normal limits  URINALYSIS, ROUTINE W REFLEX MICROSCOPIC - Abnormal; Notable for the following:    APPearance HAZY (*)    Hgb urine dipstick MODERATE (*)    Bacteria, UA RARE (*)    Squamous  Epithelial / LPF 0-5 (*)    All other components within normal limits  CBC  HCG, QUANTITATIVE, PREGNANCY  LIPASE, BLOOD  D-DIMER, QUANTITATIVE (NOT AT [facility])  I-STAT TROPOININ, ED    EKG  EKG Interpretation  Date/Time:  Sunday May 26 2017 05:41:40 EDT Ventricular Rate:  80 PR Interval:  142 QRS Duration: 84 QT Interval:  376 QTC Calculation: 433 R Axis:   26 Text Interpretation:  Normal sinus rhythm Normal ECG since last tracing no significant change Confirmed by Rolan BuccoBelfi, Phinley Schall [phone]([id number]) on 05/26/2017 8:21:31 AM       Radiology Dg Chest 2 View  Result Date: 05/26/2017 CLINICAL DATA:  Chest pain EXAM: CHEST  2 VIEW COMPARISON:  Chest radiograph 05/20/2017 FINDINGS: The heart size and  mediastinal contours are within normal limits. Both lungs are clear. The visualized skeletal structures are unremarkable. IMPRESSION: No active cardiopulmonary disease. Electronically Signed   By: Deatra Robinson M.D.   On: 05/26/2017 06:51    Procedures Procedures (including critical care time)  Medications Ordered in ED Medications  ondansetron (ZOFRAN-ODT) disintegrating tablet 4 mg (4 mg Oral Given 05/26/17 0550)     Initial Impression / Assessment and Plan / ED Course  I have reviewed the triage vital signs and the nursing notes.  Pertinent labs & imaging results that were available during my care of the patient were reviewed by me and considered in my medical decision making (see chart for details).     Patient complains of multiple pain complaints. She has chest pain left-sided abdominal pain left-sided back pain and pain radiating down her left leg. She's neurologically intact. She has normal ambulation without assistance. Her chest pain doesn't seem cardiac in nature. It's reproducible on palpation. I did do a d-dimer given that she is on oral contraceptives and this was negative. She has no hypoxia or other symptoms that would be more suggestive of a pulmonary embolus. Her abdominal pain  is non-concerning. Her labs are unremarkable. Her pregnancy test is negative. She does have some blood in her urine which she states she's had before. I did encourage her to follow-up with her PCP regarding this. She had a recent CT renal stone study about a month ago that was negative. Her chest x-ray is negative. She was discharged home in good condition. She was encouraged to have follow-up with her PCP. I did give her some resources for possible alternative outpatient follow-up as well.  Final Clinical Impressions(s) / ED Diagnoses   Final diagnoses:  Chest pain, unspecified type  Acute left-sided low back pain with sciatica, sciatica laterality unspecified    New Prescriptions New Prescriptions   No medications on file     Rolan Bucco, MD 05/26/17 1119

## 2017-05-26 NOTE — ED Notes (Signed)
Pt ambulated in hallway without complications, stated pain to L leg was 4/10

## 2017-05-26 NOTE — ED Notes (Signed)
Patient Alert and oriented X4. Stable and ambulatory. Patient verbalized understanding of the discharge instructions.  Patient belongings were taken by the patient.  

## 2017-05-26 NOTE — ED Triage Notes (Signed)
Seen a week ago for chest pain.  Reporting left sided chest pain that moves into left shoulder and down left arm.  Also reporting pain in left leg and left flank.  Also reporting nausea and vomiting.

## 2018-01-08 ENCOUNTER — Other Ambulatory Visit: Payer: Self-pay | Admitting: Obstetrics and Gynecology

## 2018-01-08 DIAGNOSIS — N644 Mastodynia: Secondary | ICD-10-CM

## 2018-01-13 ENCOUNTER — Other Ambulatory Visit: Payer: Self-pay

## 2018-01-13 ENCOUNTER — Ambulatory Visit
Admission: RE | Admit: 2018-01-13 | Discharge: 2018-01-13 | Disposition: A | Discharge status: Home / Self Care | Payer: BLUE CROSS/BLUE SHIELD | Source: Ambulatory Visit | Attending: Obstetrics and Gynecology | Admitting: Obstetrics and Gynecology

## 2018-01-13 ENCOUNTER — Ambulatory Visit: Payer: Self-pay

## 2018-01-13 DIAGNOSIS — N644 Mastodynia: Secondary | ICD-10-CM

## 2018-03-18 ENCOUNTER — Ambulatory Visit (HOSPITAL_COMMUNITY)
Admission: EM | Admit: 2018-03-18 | Discharge: 2018-03-18 | Discharge status: Against Medical Advice | Payer: BLUE CROSS/BLUE SHIELD

## 2018-03-18 NOTE — ED Notes (Signed)
Per pt access, pt decided to leave.

## 2018-06-30 ENCOUNTER — Inpatient Hospital Stay (HOSPITAL_COMMUNITY)
Admission: AD | Admit: 2018-06-30 | Discharge: 2018-06-30 | Disposition: A | Discharge status: Home / Self Care | Payer: BLUE CROSS/BLUE SHIELD | Source: Ambulatory Visit | Attending: Obstetrics and Gynecology | Admitting: Obstetrics and Gynecology

## 2018-06-30 ENCOUNTER — Other Ambulatory Visit: Payer: Self-pay

## 2018-06-30 ENCOUNTER — Encounter (HOSPITAL_COMMUNITY): Payer: Self-pay | Admitting: *Deleted

## 2018-06-30 DIAGNOSIS — Z886 Allergy status to analgesic agent status: Secondary | ICD-10-CM | POA: Insufficient documentation

## 2018-06-30 DIAGNOSIS — B3731 Acute candidiasis of vulva and vagina: Secondary | ICD-10-CM

## 2018-06-30 DIAGNOSIS — Z3202 Encounter for pregnancy test, result negative: Secondary | ICD-10-CM | POA: Insufficient documentation

## 2018-06-30 DIAGNOSIS — B373 Candidiasis of vulva and vagina: Secondary | ICD-10-CM | POA: Insufficient documentation

## 2018-06-30 LAB — URINALYSIS, ROUTINE W REFLEX MICROSCOPIC
BILIRUBIN URINE: NEGATIVE
Bacteria, UA: NONE SEEN
GLUCOSE, UA: NEGATIVE mg/dL
Ketones, ur: NEGATIVE mg/dL
Leukocytes, UA: NEGATIVE
NITRITE: NEGATIVE
PH: 7 (ref 5.0–8.0)
Protein, ur: NEGATIVE mg/dL
SPECIFIC GRAVITY, URINE: 1.006 (ref 1.005–1.030)

## 2018-06-30 LAB — GC/CHLAMYDIA PROBE AMP (~~LOC~~) NOT AT ARMC
CHLAMYDIA, DNA PROBE: NEGATIVE
NEISSERIA GONORRHEA: NEGATIVE

## 2018-06-30 LAB — POCT PREGNANCY, URINE: Preg Test, Ur: NEGATIVE

## 2018-06-30 LAB — WET PREP, GENITAL
Clue Cells Wet Prep HPF POC: NONE SEEN
SPERM: NONE SEEN
Trich, Wet Prep: NONE SEEN
Yeast Wet Prep HPF POC: NONE SEEN

## 2018-06-30 MED ORDER — TRIAMCINOLONE ACETONIDE 0.1 % EX CREA
TOPICAL_CREAM | Freq: Once | CUTANEOUS | Status: AC
  Administered 2018-06-30: 03:00:00 via TOPICAL
  Filled 2018-06-30: qty 15

## 2018-06-30 MED ORDER — FLUCONAZOLE 150 MG PO TABS
150.0000 mg | ORAL_TABLET | Freq: Once | ORAL | Status: AC
  Administered 2018-06-30: 150 mg via ORAL
  Filled 2018-06-30: qty 1

## 2018-06-30 MED ORDER — FLUCONAZOLE 150 MG PO TABS: 150.0000 mg | ORAL_TABLET | ORAL | 1 refills | Status: DC

## 2018-06-30 NOTE — Discharge Instructions (Signed)

## 2018-06-30 NOTE — MAU Provider Note (Signed)
Chief Complaint: Vaginal Itching  First Provider Initiated Contact with Patient 06/30/18 0140      SUBJECTIVE HPI: Nancy Oconnor is a 32 y.o. G2P2 who presents to Maternity Admissions reporting severe vaginal itching and labial swelling and urinary frequency.  States she has an appointment at [facility] tomorrow but was too uncomfortable to wait so she came to maternity admissions  Location: Vul[place] Quality: Burning Severity: 10/10 on pain scale Duration: Several days Context: None.  Denies recent intercourse. Timing: Constant Modifying factors: Nothing.  Has not tried anything for her symptoms. Associated signs and symptoms: Negative for fever, vaginal bleeding.  Positive for vulvar swelling, vaginal discharge.  Past Medical History:  Diagnosis Date  . Chest pain    limited exertion for past 4 days  . Chills   . Dry cough    2 years  . Dyspnea   . Headache   . Nasal drainage   . PID (pelvic inflammatory disease)    OB History  Gravida Para Term Preterm AB Living  2 2 2         SAB TAB Ectopic Multiple Live Births          2    # Outcome Date GA Lbr Len/2nd Weight Sex Delivery Anes PTL Lv  2 Term           1 Term            Past Surgical History:  Procedure Laterality Date  . CESAREAN SECTION    . IUD REMOVAL N/A 10/08/2016   Procedure: INTRAUTERINE DEVICE (IUD) REMOVAL;  Surgeon: Edwinna Areolaecilia Worema Banga, DO;  Location: [facility];  Service: Gynecology;  Laterality: N/A;  . LAPAROSCOPY N/A 10/08/2016   Procedure: LAPAROSCOPY DIAGNOSTIC;  Surgeon: Edwinna Areolaecilia Worema Banga, DO;  Location: [facility];  Service: Gynecology;  Laterality: N/A;  . LYSIS OF ADHESION N/A 10/08/2016   Procedure: LYSIS OF ADHESION;  Surgeon: Edwinna Areolaecilia Worema Banga, DO;  Location: [facility];  Service: Gynecology;  Laterality: N/A;   Social History   Socioeconomic History  . Marital status: Married    Spouse name: Not on file  . Number of children: Not on file  . Years of education: Not on file  . Highest  education level: Not on file  Occupational History  . Not on file  Social Needs  . Financial resource strain: Not on file  . Food insecurity:    Worry: Not on file    Inability: Not on file  . Transportation needs:    Medical: Not on file    Non-medical: Not on file  Tobacco Use  . Smoking status: Never Smoker  . Smokeless tobacco: Never Used  Substance and Sexual Activity  . Alcohol use: No  . Drug use: No  . Sexual activity: Not Currently    Birth control/protection: Pill  Lifestyle  . Physical activity:    Days per week: Not on file    Minutes per session: Not on file  . Stress: Not on file  Relationships  . Social connections:    Talks on phone: Not on file    Gets together: Not on file    Attends religious service: Not on file    Active member of club or organization: Not on file    Attends meetings of clubs or organizations: Not on file    Relationship status: Not on file  . Intimate partner violence:    Fear of current or ex partner: Not on file    Emotionally abused:  Not on file    Physically abused: Not on file    Forced sexual activity: Not on file  Other Topics Concern  . Not on file  Social History Narrative  . Not on file   Family History  Problem Relation Age of Onset  . Hypertension Mother   . Hypertension Father    No current facility-administered medications on file prior to encounter.    Current Outpatient Medications on File Prior to Encounter  Medication Sig Dispense Refill  . etodolac (LODINE) 300 MG capsule Take 1 capsule (300 mg total) by mouth every 8 (eight) hours. 21 capsule 0  . norgestimate-ethinyl estradiol (SPRINTEC 28) 0.25-35 MG-MCG tablet Take 1 tablet by mouth daily. 1 Package 11  . oxyCODONE-acetaminophen (PERCOCET) 5-325 MG tablet Take 1 tablet by mouth every 6 (six) hours as needed for severe pain. (Patient not taking: Reported on 05/26/2017) 15 tablet 0   Allergies  Allergen Reactions  . Other Itching    Unknown pain  medication     I have reviewed patient's Past Medical Hx, Surgical Hx, Family Hx, Social Hx, medications and allergies.   Review of Systems  Constitutional: Negative for chills and fever.  Gastrointestinal: Negative for abdominal pain.  Genitourinary: Positive for frequency, vaginal discharge and vaginal pain. Negative for difficulty urinating, dysuria, hematuria, pelvic pain and vaginal bleeding.  Musculoskeletal: Positive for back pain.  Skin: Negative for rash.    OBJECTIVE Patient Vitals for the past 24 hrs:  BP Temp Pulse Resp SpO2 Height Weight  06/30/18 0052 109/72 98.5 F (36.9 C) 66 16 100 % - -  06/30/18 0046 - - - - - 5\' 3"  (1.6 m) 145 lb (65.8 kg)   Constitutional: Well-developed, well-nourished female in no acute distress.  Cardiovascular: normal rate Respiratory: normal rate and effort.  GI: Abd soft, non-tender MS: Extremities nontender, no edema, normal ROM Neurologic: Alert and oriented x 4.  GU: Neg CVAT.  PELVIC EXAM: Exam limited by patient intolerance.  Mild edema and erythema of labia minora bilaterally, no lesions.  Small amount of thick, white, odorless, curd-like discharge mixed with watery discharge. No blood noted.  Patient unable to tolerate speculum exam or bimanual exam.  LAB RESULTS Results for orders placed or performed during the hospital encounter of 06/30/18 (from the past 24 hour(s))  Urinalysis, Routine w reflex microscopic     Status: Abnormal   Collection Time: 06/30/18 12:53 AM  Result Value Ref Range   Color, Urine YELLOW YELLOW   APPearance CLEAR CLEAR   Specific Gravity, Urine 1.006 1.005 - 1.030   pH 7.0 5.0 - 8.0   Glucose, UA NEGATIVE NEGATIVE mg/dL   Hgb urine dipstick SMALL (A) NEGATIVE   Bilirubin Urine NEGATIVE NEGATIVE   Ketones, ur NEGATIVE NEGATIVE mg/dL   Protein, ur NEGATIVE NEGATIVE mg/dL   Nitrite NEGATIVE NEGATIVE   Leukocytes, UA NEGATIVE NEGATIVE   RBC / HPF 0-5 0 - 5 RBC/hpf   WBC, UA 0-5 0 - 5 WBC/hpf    Bacteria, UA NONE SEEN NONE SEEN   Squamous Epithelial / LPF 0-5 0 - 5  Pregnancy, urine POC     Status: None   Collection Time: 06/30/18 12:58 AM  Result Value Ref Range   Preg Test, Ur NEGATIVE NEGATIVE  Wet prep, genital     Status: Abnormal   Collection Time: 06/30/18  1:52 AM  Result Value Ref Range   Yeast Wet Prep HPF POC NONE SEEN NONE SEEN   Trich, Wet Prep  NONE SEEN NONE SEEN   Clue Cells Wet Prep HPF POC NONE SEEN NONE SEEN   WBC, Wet Prep HPF POC FEW (A) NONE SEEN   Sperm NONE SEEN     IMAGING No results found.  MAU COURSE Orders Placed This Encounter  Procedures  . Wet prep, genital  . Urine Culture  . Urinalysis, Routine w reflex microscopic  . Lab instructions  . Pregnancy, urine POC  . Discharge patient   Meds ordered this encounter  Medications  . fluconazole (DIFLUCAN) tablet 150 mg  . triamcinolone cream (KENALOG) 0.1 %  . fluconazole (DIFLUCAN) 150 MG tablet    Sig: Take 1 tablet (150 mg total) by mouth every 3 (three) days. For three doses    Dispense:  3 tablet    Refill:  1    Order Specific Question:   Supervising Provider    Answer:   Tilda Burrow [[id number]]   Discussed Hx, labs, exam w/ Dr. Senaida Ores. Agrees w/ POC.     MDM -Vulvar edema, erythema and discharge due to yeast infection.  Will treat with Diflucan and triamcinolone cream.  Rx given for Diflucan to use every 3 days as needed for 1-2 weeks given that patient has such severe symptoms . ASSESSMENT 1. Vaginal yeast infection     PLAN Discharge home in stable condition. Comfort measures reviewed. GC/chlamydia cultures pending. Follow-up Information    Edwinna Areola, DO Follow up.   Specialty:  Obstetrics and Gynecology Why:  as scheduled or sooner as needed if no improvement in 3 days.  Contact information: [address] [place] [address] [phone]          Allergies as of 06/30/2018      Reactions   Other Itching   Unknown pain medication        Medication List    STOP taking these medications   oxyCODONE-acetaminophen 5-325 MG tablet Commonly known as:  PERCOCET     TAKE these medications   etodolac 300 MG capsule Commonly known as:  LODINE Take 1 capsule (300 mg total) by mouth every 8 (eight) hours.   fluconazole 150 MG tablet Commonly known as:  DIFLUCAN Take 1 tablet (150 mg total) by mouth every 3 (three) days. For three doses   norgestimate-ethinyl estradiol 0.25-35 MG-MCG tablet Commonly known as:  SPRINTEC 28 Take 1 tablet by mouth daily.        Katrinka Blazing, [place], CNM 06/30/2018  3:00 AM

## 2018-06-30 NOTE — MAU Note (Signed)
Pt here with vaginal itching and swelling for the past two days.

## 2018-06-30 NOTE — MAU Note (Signed)
Pt reports vaginal itching and urinary frequency. Pt was to be seen on Monday in office, but the pain was too much. She also reports back pain. Pt also states that she has been  Feeling hot and cold for 2 days

## 2018-07-01 LAB — URINE CULTURE
Culture: NO GROWTH
SPECIAL REQUESTS: NORMAL

## 2018-08-05 IMAGING — CR DG CHEST 2V
2 series · 2 of 2 positions shown · non-contrast
Comparison: Prior chest x-ray on 06/24/2016 as well as frontal
chest radiograph from abdominal series on 09/06/2016.

CLINICAL DATA: Chest pain for 3 days.

EXAM:
CHEST  2 VIEW

[chest pa]
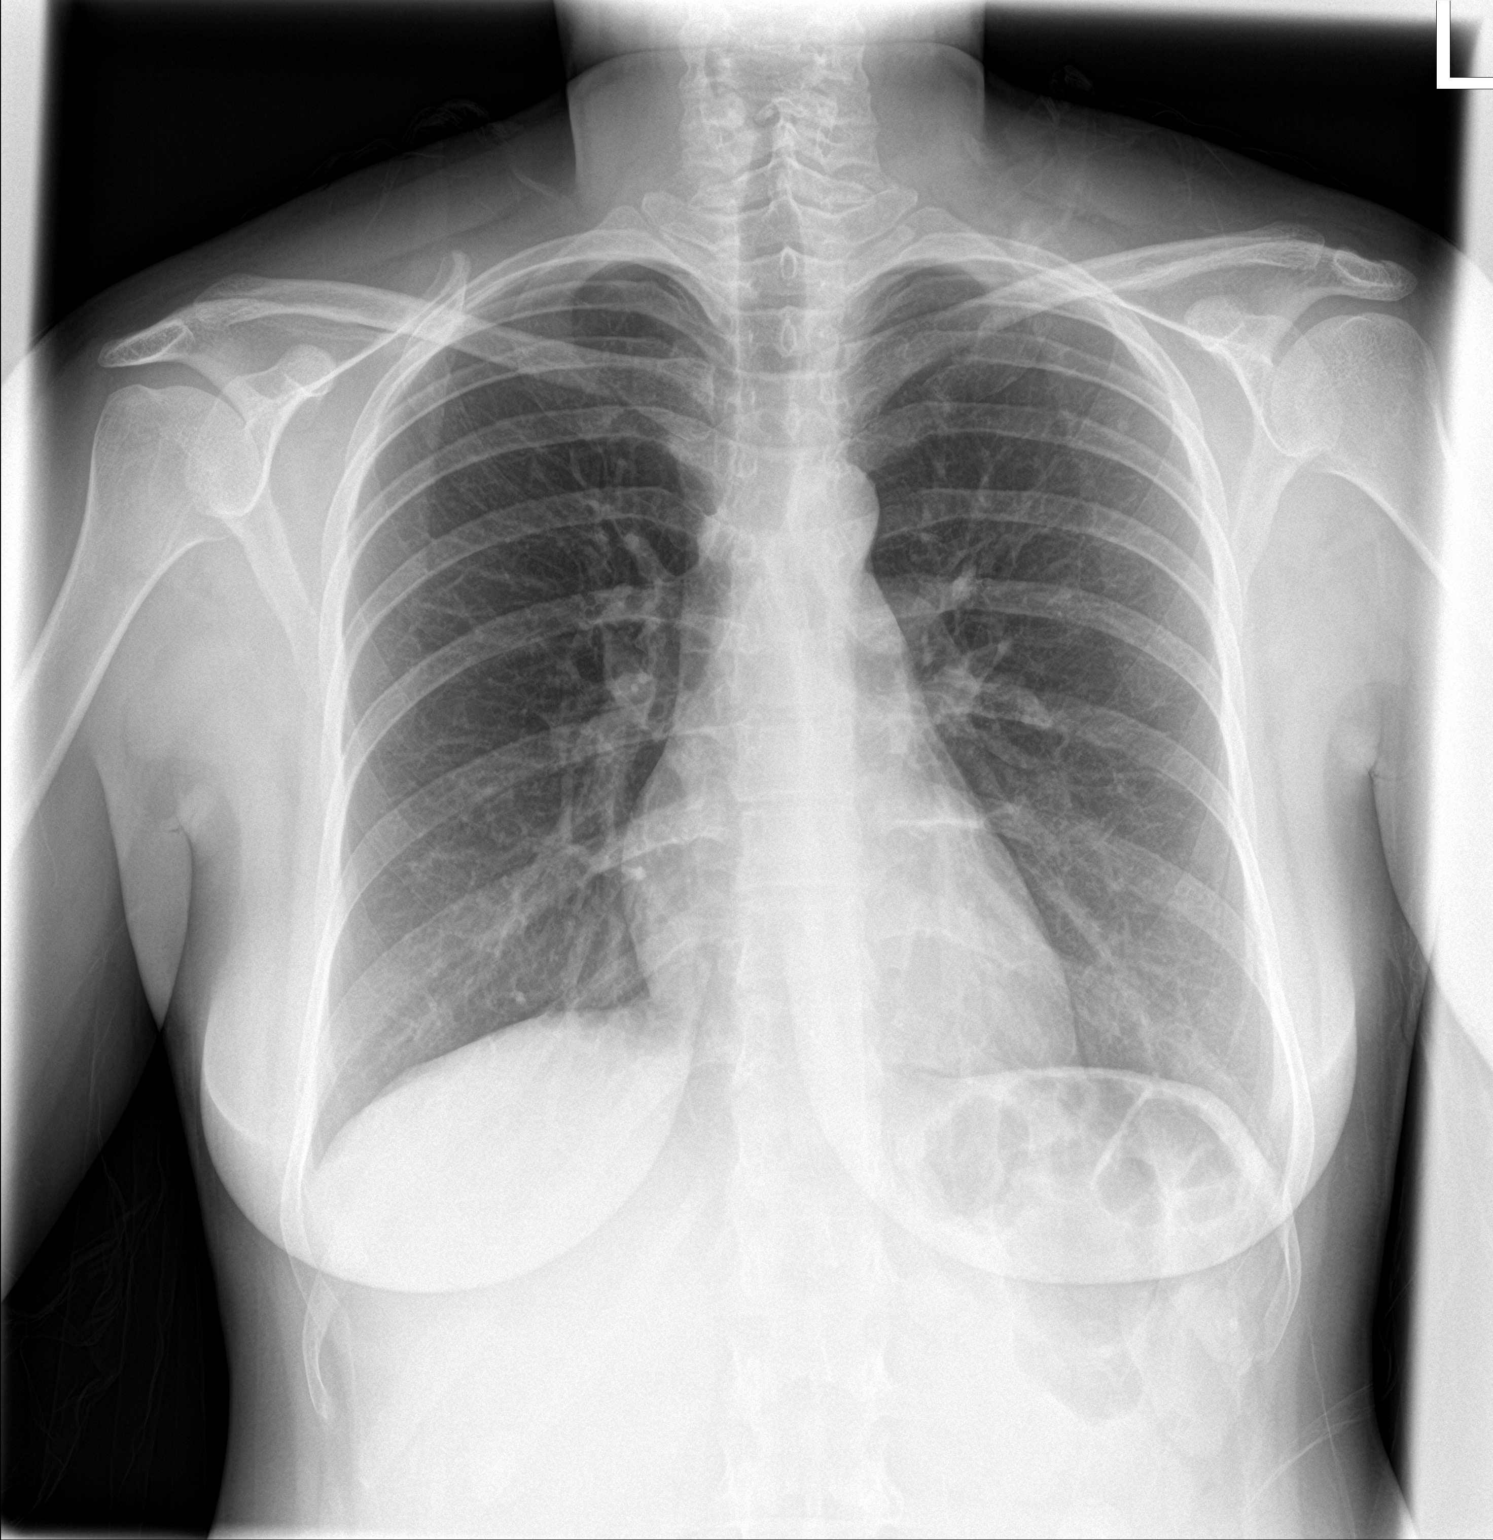

[chest lat]
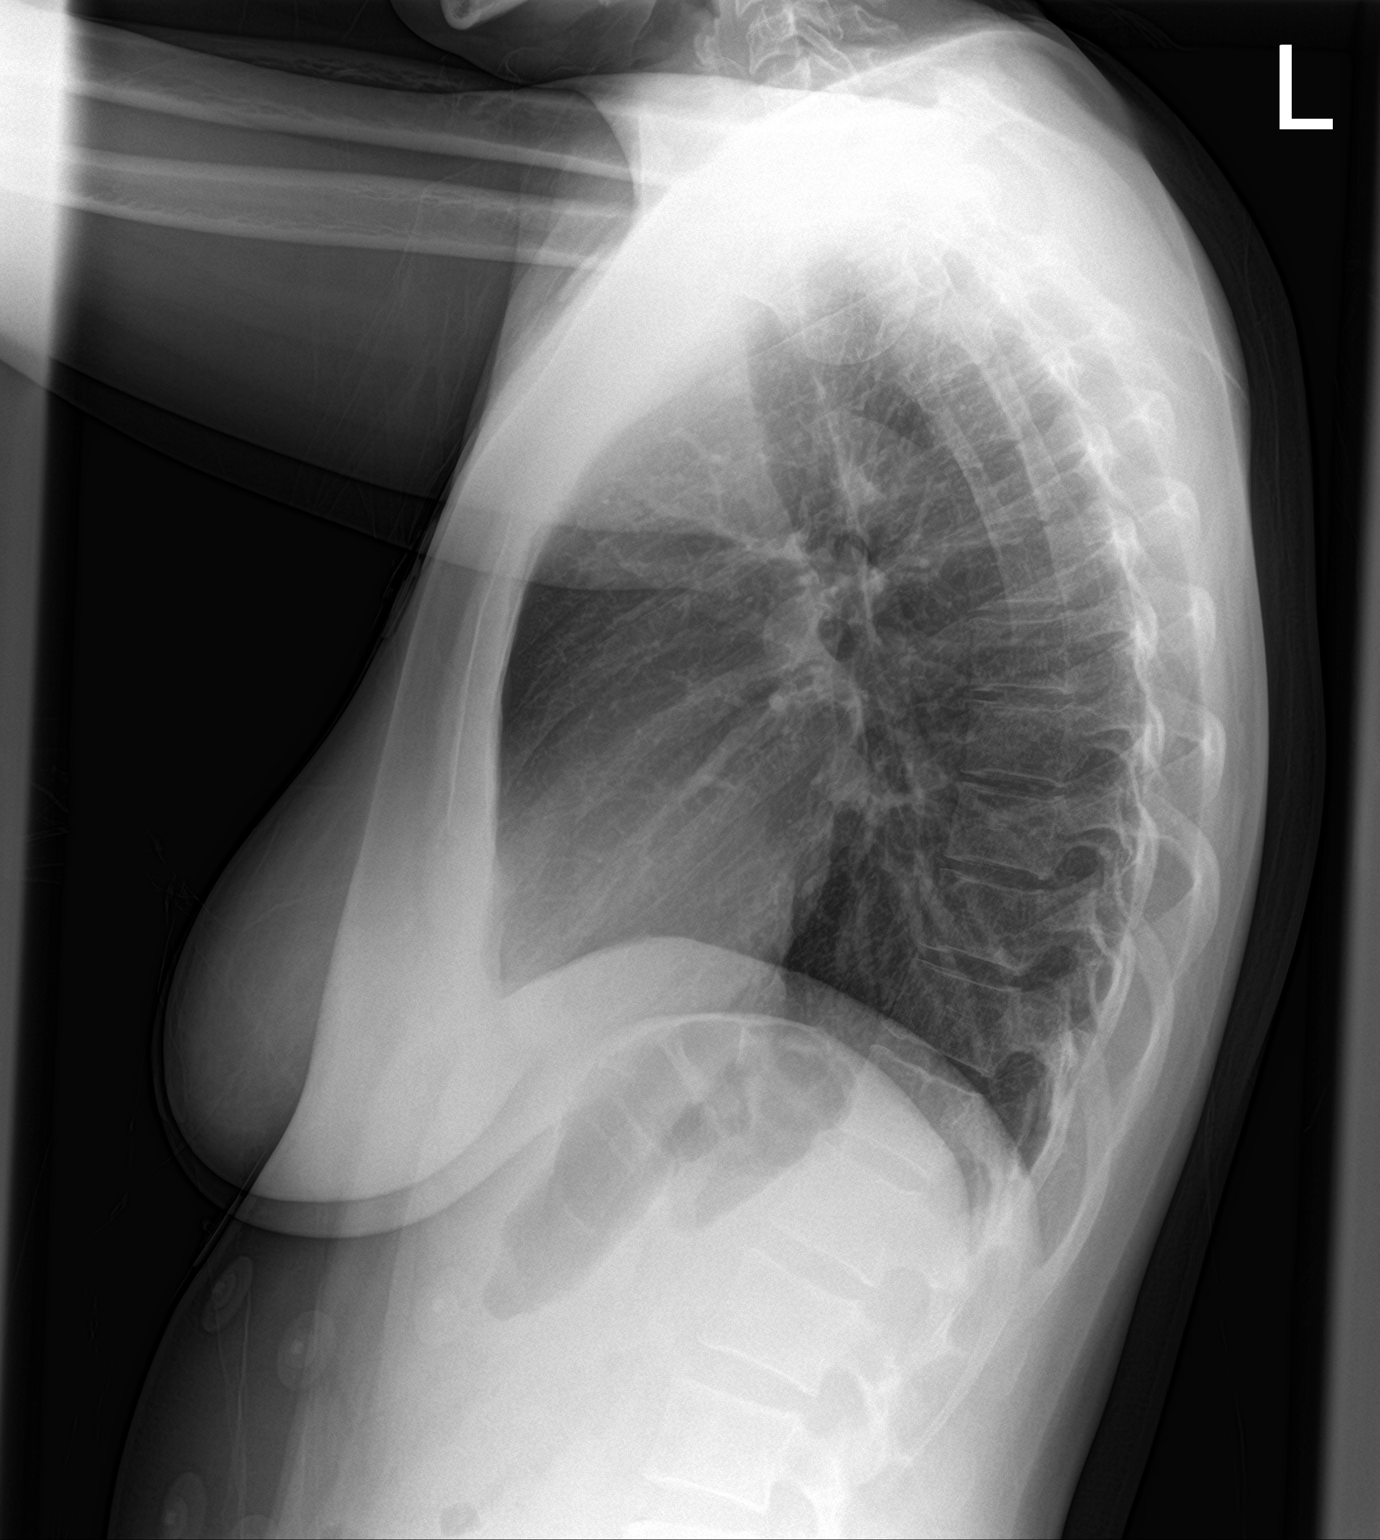

[2 of 2 positions shown; findings below may reference images not displayed]

FINDINGS: The heart size and mediastinal contours are within normal limits.
There is no evidence of pulmonary edema, consolidation,
pneumothorax, nodule or pleural fluid. The visualized skeletal
structures are unremarkable.
IMPRESSION: No active cardiopulmonary disease.

## 2019-03-10 ENCOUNTER — Encounter (HOSPITAL_COMMUNITY): Payer: Self-pay | Admitting: Emergency Medicine

## 2019-03-10 ENCOUNTER — Ambulatory Visit (HOSPITAL_COMMUNITY)
Admission: EM | Admit: 2019-03-10 | Discharge: 2019-03-10 | Disposition: A | Discharge status: Home / Self Care | Payer: BLUE CROSS/BLUE SHIELD | Attending: Family Medicine | Admitting: Family Medicine

## 2019-03-10 ENCOUNTER — Other Ambulatory Visit: Payer: Self-pay

## 2019-03-10 DIAGNOSIS — R112 Nausea with vomiting, unspecified: Secondary | ICD-10-CM | POA: Insufficient documentation

## 2019-03-10 DIAGNOSIS — R059 Cough, unspecified: Secondary | ICD-10-CM

## 2019-03-10 DIAGNOSIS — N898 Other specified noninflammatory disorders of vagina: Secondary | ICD-10-CM | POA: Diagnosis present

## 2019-03-10 DIAGNOSIS — R197 Diarrhea, unspecified: Secondary | ICD-10-CM | POA: Insufficient documentation

## 2019-03-10 DIAGNOSIS — R05 Cough: Secondary | ICD-10-CM | POA: Insufficient documentation

## 2019-03-10 MED ORDER — ONDANSETRON 4 MG PO TBDP: 4.0000 mg | ORAL_TABLET | Freq: Three times a day (TID) | ORAL | 0 refills | Status: DC | PRN

## 2019-03-10 MED ORDER — LORATADINE 10 MG PO TABS: 10.0000 mg | ORAL_TABLET | Freq: Every day | ORAL | 0 refills | Status: DC

## 2019-03-10 MED ORDER — BENZONATATE 200 MG PO CAPS: 200.0000 mg | ORAL_CAPSULE | Freq: Three times a day (TID) | ORAL | 0 refills | Status: AC | PRN

## 2019-03-10 NOTE — ED Provider Notes (Signed)
[facility]    CSN: [id number] Arrival date & time: 03/10/19  1714     History   Chief Complaint Chief Complaint  Patient presents with  . Diarrhea    HPI Jonatha Habicht is a 33 y.o. female no contributing past medical history presenting today for evaluation of vomiting, diarrhea and cough.  Patient states that yesterday she had one episode of vomiting, diarrhea as well as a cough.  Vomiting has not been persistent until today, has had some mild nausea off and on.  One episode of diarrhea today.  Does endorse associated abdominal pain.  She is also noted some discharge that has been abnormal for her.  States that she was treated for yeast infection last week, but she is still having discharge.  She notes that her last menstrual period ended 2 days ago.  Denies itching or irritation.  Cough has been occasional.  She does endorse some shortness of breath and discomfort with breathing.  She denies associated fevers, body aches or chills.  She is also had a headache on the left side of her head.  Described as sharp.  Denies any vision changes.  She has not taken anything for her symptoms.Denies any recent travel, denies any known exposure to COVID-19.  Notes that she has been staying home of recently.  HPI  Past Medical History:  Diagnosis Date  . Chest pain    limited exertion for past 4 days  . Chills   . Dry cough    2 years  . Dyspnea   . Headache   . Nasal drainage   . PID (pelvic inflammatory disease)     Patient Active Problem List   Diagnosis Date Noted  . Complicated UTI (urinary tract infection) 03/10/2015  . PID (acute pelvic inflammatory disease) 03/10/2015  . Nausea with vomiting     Past Surgical History:  Procedure Laterality Date  . CESAREAN SECTION    . IUD REMOVAL N/A 10/08/2016   Procedure: INTRAUTERINE DEVICE (IUD) REMOVAL;  Surgeon: Edwinna Areola, DO;  Location: [facility];  Service: Gynecology;  Laterality: N/A;  . LAPAROSCOPY N/A  10/08/2016   Procedure: LAPAROSCOPY DIAGNOSTIC;  Surgeon: Edwinna Areola, DO;  Location: [facility];  Service: Gynecology;  Laterality: N/A;  . LYSIS OF ADHESION N/A 10/08/2016   Procedure: LYSIS OF ADHESION;  Surgeon: Edwinna Areola, DO;  Location: [facility];  Service: Gynecology;  Laterality: N/A;    OB History    Gravida  2   Para  2   Term  2   Preterm      AB      Living        SAB      TAB      Ectopic      Multiple      Live Births  2            Home Medications    Prior to Admission medications   Medication Sig Start Date End Date Taking? Authorizing Provider  VITAMIN D PO Take by mouth.   Yes [provider]  benzonatate (TESSALON) 200 MG capsule Take 1 capsule (200 mg total) by mouth 3 (three) times daily as needed for up to 7 days for cough. 03/10/19 03/17/19  Wieters, Hallie C, PA-C  loratadine (CLARITIN) 10 MG tablet Take 1 tablet (10 mg total) by mouth daily. 03/10/19   Wieters, Hallie C, PA-C  ondansetron (ZOFRAN ODT) 4 MG disintegrating tablet Take 1 tablet (4  mg total) by mouth every 8 (eight) hours as needed for nausea or vomiting. 03/10/19   Wieters, Junius Creamer, PA-C    Family History Family History  Problem Relation Age of Onset  . Hypertension Mother   . Hypertension Father     Social History Social History   Tobacco Use  . Smoking status: Never Smoker  . Smokeless tobacco: Never Used  Substance Use Topics  . Alcohol use: No  . Drug use: No     Allergies   Other   Review of Systems Review of Systems  Constitutional: Negative for activity change, appetite change, chills, fatigue and fever.  HENT: Negative for congestion, ear pain, rhinorrhea, sinus pressure, sore throat and trouble swallowing.   Eyes: Negative for discharge and redness.  Respiratory: Positive for cough. Negative for chest tightness and shortness of breath.   Cardiovascular: Negative for chest pain.  Gastrointestinal: Negative for abdominal pain,  diarrhea, nausea and vomiting.  Genitourinary: Positive for vaginal discharge. Negative for dysuria, frequency, urgency and vaginal bleeding.  Musculoskeletal: Negative for myalgias.  Skin: Negative for rash.  Neurological: Positive for headaches. Negative for dizziness and light-headedness.     Physical Exam Triage Vital Signs ED Triage Vitals  Enc Vitals Group     BP 03/10/19 1737 107/69     Pulse Rate 03/10/19 1737 82     Resp 03/10/19 1737 18     Temp 03/10/19 1737 97.9 F (36.6 C)     Temp src --      SpO2 03/10/19 1737 100 %     Weight --      Height --      Head Circumference --      Peak Flow --      Pain Score 03/10/19 1734 6     Pain Loc --      Pain Edu? --      Excl. in GC? --    No data found.  Updated Vital Signs BP 107/69 (BP Location: Left Arm)   Pulse 82   Temp 97.9 F (36.6 C)   Resp 18   LMP 03/08/2019   SpO2 100%   Visual Acuity Right Eye Distance:   Left Eye Distance:   Bilateral Distance:    Right Eye Near:   Left Eye Near:    Bilateral Near:     Physical Exam Vitals signs and nursing note reviewed.  Constitutional:      General: She is not in acute distress.    Appearance: She is well-developed.  HENT:     Head: Normocephalic and atraumatic.     Ears:     Comments: Bilateral ears without tenderness to palpation of external auricle, tragus and mastoid, EAC's without erythema or swelling, TM's with good bony landmarks and cone of light. Non erythematous.    Nose:     Comments: Nasal mucosa pink, no swelling, no rhinorrhea    Mouth/Throat:     Comments: Oral mucosa pink and moist, no tonsillar enlargement or exudate. Posterior pharynx patent and nonerythematous, no uvula deviation or swelling. Normal phonation. Eyes:     Conjunctiva/sclera: Conjunctivae normal.  Neck:     Musculoskeletal: Neck supple.  Cardiovascular:     Rate and Rhythm: Normal rate and regular rhythm.     Heart sounds: No murmur.  Pulmonary:     Effort:  Pulmonary effort is normal. No respiratory distress.     Breath sounds: Normal breath sounds.     Comments: Breathing comfortably at rest,  CTABL, no wheezing, rales or other adventitious sounds auscultated Abdominal:     Palpations: Abdomen is soft.     Tenderness: There is abdominal tenderness.     Comments: Abdomen soft, nondistended, nontender to bilateral upper quadrants, patient is tender to right lower quadrant, but negative McBurney's, negative rebound  Skin:    General: Skin is warm and dry.  Neurological:     Mental Status: She is alert.      UC Treatments / Results  Labs (all labs ordered are listed, but only abnormal results are displayed) Labs Reviewed  CERVICOVAGINAL ANCILLARY ONLY    EKG None  Radiology No results found.  Procedures Procedures (including critical care time)  Medications Ordered in UC Medications - No data to display  Initial Impression / Assessment and Plan / UC Course  I have reviewed the triage vital signs and the nursing notes.  Pertinent labs & imaging results that were available during my care of the patient were reviewed by me and considered in my medical decision making (see chart for details).    GI symptoms x1 day, no persistent vomiting, 1 episode of looser stools.  Does have right lower quadrant pain, vital signs stable, negative rebound/negative peritoneal signs feel this is less likely appendicitis, but advised patient to continue to monitor.  She does endorse vaginal discharge, will obtain swab and call patient with results if any abnormal causes of discharge.  Zofran as needed for any further nausea.  Patient with 1 day of cough, no fevers, tachycardia or hypoxia.  No known exposures to COVID-19, but discussed with patient this is still a possibility.  Provided Tessalon for cough to use as needed.  Continue to monitor, recommended supportive care.Discussed strict return precautions. Patient verbalized understanding and is  agreeable with plan.   Final Clinical Impressions(s) / UC Diagnoses   Final diagnoses:  Nausea vomiting and diarrhea  Vaginal discharge  Cough     Discharge Instructions     Please use zofran as needed for further nausea and vomiting- this tablet dissolves under tongue Tessalon as needed for cough every 8hours Daily loratadine/claritin to help with congestion Rest, Drink fluids, Tylenol as needed for headache  We will send swab off to check for causes of discharge. We will call with results and provide medicine if needed.  Please stay at home and limit contact with others Follow up if developing increased cough, shortness of breath, fevers, worsening abdominal pain   ED Prescriptions    Medication Sig Dispense Auth. Provider   benzonatate (TESSALON) 200 MG capsule Take 1 capsule (200 mg total) by mouth 3 (three) times daily as needed for up to 7 days for cough. 28 capsule Wieters, Hallie C, PA-C   loratadine (CLARITIN) 10 MG tablet Take 1 tablet (10 mg total) by mouth daily. 15 tablet Wieters, Hallie C, PA-C   ondansetron (ZOFRAN ODT) 4 MG disintegrating tablet Take 1 tablet (4 mg total) by mouth every 8 (eight) hours as needed for nausea or vomiting. 20 tablet Wieters, [place] C, PA-C     Controlled Substance Prescriptions [place] Controlled Substance Registry consulted? Not Applicable   Lew Dawes, [place] 03/10/19 1939

## 2019-03-10 NOTE — Discharge Instructions (Addendum)
Please use zofran as needed for further nausea and vomiting- this tablet dissolves under tongue Tessalon as needed for cough every 8hours Daily loratadine/claritin to help with congestion Rest, Drink fluids, Tylenol as needed for headache  We will send swab off to check for causes of discharge. We will call with results and provide medicine if needed.  Please stay at home and limit contact with others Follow up if developing increased cough, shortness of breath, fevers, worsening abdominal pain

## 2019-03-10 NOTE — ED Triage Notes (Addendum)
Onset last night of diarrhea and vomiting.  No vomiting today.  1 episode of diarrhea today.  Reports family member encouraged her to come to be seen  Patient has a cough, cough makes chest hurt

## 2019-03-11 LAB — CERVICOVAGINAL ANCILLARY ONLY
Bacterial vaginitis: NEGATIVE
Candida vaginitis: NEGATIVE
Chlamydia: NEGATIVE
Neisseria Gonorrhea: NEGATIVE
TRICH (WINDOWPATH): NEGATIVE

## 2019-10-09 ENCOUNTER — Other Ambulatory Visit: Payer: Self-pay

## 2019-10-09 DIAGNOSIS — Z20822 Contact with and (suspected) exposure to covid-19: Secondary | ICD-10-CM

## 2019-10-11 LAB — NOVEL CORONAVIRUS, NAA: SARS-CoV-2, NAA: NOT DETECTED

## 2019-10-12 ENCOUNTER — Telehealth: Payer: Self-pay | Admitting: General Practice

## 2019-10-12 NOTE — Telephone Encounter (Signed)
Negative COVID results given. Patient results "NOT Detected." Caller expressed understanding. ° °

## 2019-10-13 ENCOUNTER — Other Ambulatory Visit: Payer: Self-pay

## 2019-10-13 DIAGNOSIS — Z20822 Contact with and (suspected) exposure to covid-19: Secondary | ICD-10-CM

## 2019-10-15 LAB — NOVEL CORONAVIRUS, NAA: SARS-CoV-2, NAA: NOT DETECTED

## 2020-12-27 ENCOUNTER — Ambulatory Visit (HOSPITAL_COMMUNITY)
Admission: EM | Admit: 2020-12-27 | Discharge: 2020-12-27 | Disposition: A | Discharge status: Home / Self Care | Payer: BLUE CROSS/BLUE SHIELD

## 2021-04-29 ENCOUNTER — Encounter (HOSPITAL_COMMUNITY): Payer: Self-pay | Admitting: *Deleted

## 2021-04-29 ENCOUNTER — Other Ambulatory Visit: Payer: Self-pay

## 2021-04-29 ENCOUNTER — Ambulatory Visit (HOSPITAL_COMMUNITY)
Admission: EM | Admit: 2021-04-29 | Discharge: 2021-04-29 | Disposition: A | Discharge status: Home / Self Care | Payer: 59 | Attending: Internal Medicine | Admitting: Internal Medicine

## 2021-04-29 DIAGNOSIS — N898 Other specified noninflammatory disorders of vagina: Secondary | ICD-10-CM | POA: Diagnosis not present

## 2021-04-29 DIAGNOSIS — R102 Pelvic and perineal pain: Secondary | ICD-10-CM | POA: Insufficient documentation

## 2021-04-29 DIAGNOSIS — R351 Nocturia: Secondary | ICD-10-CM | POA: Diagnosis present

## 2021-04-29 DIAGNOSIS — R3129 Other microscopic hematuria: Secondary | ICD-10-CM | POA: Insufficient documentation

## 2021-04-29 DIAGNOSIS — J069 Acute upper respiratory infection, unspecified: Secondary | ICD-10-CM | POA: Insufficient documentation

## 2021-04-29 DIAGNOSIS — Z20822 Contact with and (suspected) exposure to covid-19: Secondary | ICD-10-CM | POA: Diagnosis present

## 2021-04-29 LAB — POCT URINALYSIS DIPSTICK, ED / UC
Bilirubin Urine: NEGATIVE
Glucose, UA: NEGATIVE mg/dL
Ketones, ur: NEGATIVE mg/dL
Leukocytes,Ua: NEGATIVE
Nitrite: NEGATIVE
Protein, ur: NEGATIVE mg/dL
Specific Gravity, Urine: 1.025 (ref 1.005–1.030)
Urobilinogen, UA: 0.2 mg/dL (ref 0.0–1.0)
pH: 5.5 (ref 5.0–8.0)

## 2021-04-29 LAB — POC URINE PREG, ED: Preg Test, Ur: NEGATIVE

## 2021-04-29 LAB — POCT RAPID STREP A, ED / UC: Streptococcus, Group A Screen (Direct): NEGATIVE

## 2021-04-29 MED ORDER — PHENAZOPYRIDINE HCL 200 MG PO TABS: 200.0000 mg | ORAL_TABLET | Freq: Three times a day (TID) | ORAL | 0 refills | Status: DC

## 2021-04-29 MED ORDER — BENZONATATE 200 MG PO CAPS: 200.0000 mg | ORAL_CAPSULE | Freq: Two times a day (BID) | ORAL | 0 refills | Status: DC | PRN

## 2021-04-29 MED ORDER — NITROFURANTOIN MONOHYD MACRO 100 MG PO CAPS: 100.0000 mg | ORAL_CAPSULE | Freq: Two times a day (BID) | ORAL | 0 refills | Status: DC

## 2021-04-29 NOTE — ED Triage Notes (Signed)
Pt reports having a Vag discharge ,Bilateral groin pain ,sore throat and ciugh for 2 days.

## 2021-04-29 NOTE — ED Provider Notes (Addendum)
[facility]    CSN: [id number] Arrival date & time: 04/29/21  1730      History   Chief Complaint Chief Complaint  Patient presents with  . Sore Throat  . Cough  . Vaginal Discharge  . Groin Pain    HPI Nancy Oconnor is a 35 y.o. female who presents with 2 complains.  1- ST and non productive cough x 2 days. Has mild body aches, Was chilling all night and thinks she may have had a fever, but does not have a thermometer to check.  2- Has been having lower abd pain, and dysuria since last night. Voided x 4 last night which is unusual. Has white thick discharge with odor, but no itching.     Past Medical History:  Diagnosis Date  . Chest pain    limited exertion for past 4 days  . Chills   . Dry cough    2 years  . Dyspnea   . Headache   . Nasal drainage   . PID (pelvic inflammatory disease)     Patient Active Problem List   Diagnosis Date Noted  . Complicated UTI (urinary tract infection) 03/10/2015  . PID (acute pelvic inflammatory disease) 03/10/2015  . Nausea with vomiting     Past Surgical History:  Procedure Laterality Date  . CESAREAN SECTION    . IUD REMOVAL N/A 10/08/2016   Procedure: INTRAUTERINE DEVICE (IUD) REMOVAL;  Surgeon: Edwinna Areola, DO;  Location: [facility];  Service: Gynecology;  Laterality: N/A;  . LAPAROSCOPY N/A 10/08/2016   Procedure: LAPAROSCOPY DIAGNOSTIC;  Surgeon: Edwinna Areola, DO;  Location: [facility];  Service: Gynecology;  Laterality: N/A;  . LYSIS OF ADHESION N/A 10/08/2016   Procedure: LYSIS OF ADHESION;  Surgeon: Edwinna Areola, DO;  Location: [facility];  Service: Gynecology;  Laterality: N/A;    OB History    Gravida  2   Para  2   Term  2   Preterm      AB      Living        SAB      IAB      Ectopic      Multiple      Live Births  2            Home Medications    Prior to Admission medications   Medication Sig Start Date End Date Taking? Authorizing Provider   benzonatate (TESSALON) 200 MG capsule Take 1 capsule (200 mg total) by mouth 2 (two) times daily as needed for cough. 04/29/21  Yes Rodriguez-Southworth, Nettie Elm, PA-C  nitrofurantoin, macrocrystal-monohydrate, (MACROBID) 100 MG capsule Take 1 capsule (100 mg total) by mouth 2 (two) times daily. 04/29/21  Yes Rodriguez-Southworth, Nettie Elm, PA-C  phenazopyridine (PYRIDIUM) 200 MG tablet Take 1 tablet (200 mg total) by mouth 3 (three) times daily. For bladder pain 04/29/21  Yes Rodriguez-Southworth, Nettie Elm, PA-C  loratadine (CLARITIN) 10 MG tablet Take 1 tablet (10 mg total) by mouth daily. 03/10/19   Wieters, Hallie C, PA-C  ondansetron (ZOFRAN ODT) 4 MG disintegrating tablet Take 1 tablet (4 mg total) by mouth every 8 (eight) hours as needed for nausea or vomiting. 03/10/19   Wieters, Hallie C, PA-C  VITAMIN D PO Take by mouth.    [provider]    Family History Family History  Problem Relation Age of Onset  . Hypertension Mother   . Hypertension Father     Social History Social History   Tobacco  Use  . Smoking status: Never Smoker  . Smokeless tobacco: Never Used  Substance Use Topics  . Alcohol use: No  . Drug use: No     Allergies   Other   Review of Systems Review of Systems  Constitutional: Positive for chills, fatigue and fever.  HENT: Positive for congestion, postnasal drip, rhinorrhea and sore throat. Negative for ear discharge and ear pain.   Eyes: Negative for discharge.  Respiratory: Positive for cough. Negative for chest tightness and shortness of breath.   Cardiovascular: Negative for chest pain.  Gastrointestinal: Positive for abdominal pain. Negative for constipation, diarrhea, nausea and vomiting.  Genitourinary: Positive for dysuria, frequency and pelvic pain. Negative for difficulty urinating.  Musculoskeletal: Positive for myalgias. Negative for gait problem.  Skin: Negative for rash.  Neurological: Negative for headaches.  Hematological: Negative  for adenopathy.     Physical Exam Triage Vital Signs ED Triage Vitals  Enc Vitals Group     BP 04/29/21 1815 112/71     Pulse Rate 04/29/21 1815 96     Resp 04/29/21 1815 20     Temp 04/29/21 1815 97.8 F (36.6 C)     Temp src --      SpO2 04/29/21 1815 100 %     Weight --      Height --      Head Circumference --      Peak Flow --      Pain Score 04/29/21 1811 6     Pain Loc --      Pain Edu? --      Excl. in GC? --    No data found.  Updated Vital Signs BP 112/71   Pulse 96   Temp 97.8 F (36.6 C)   Resp 20   LMP 03/12/2021   SpO2 100%   Visual Acuity Right Eye Distance:   Left Eye Distance:   Bilateral Distance:    Right Eye Near:   Left Eye Near:    Bilateral Near:     Physical Exam Vitals and nursing note reviewed.  Constitutional:      General: She is not in acute distress.    Appearance: She is normal weight. She is not toxic-appearing.  HENT:     Head: Normocephalic.     Right Ear: Tympanic membrane and ear canal normal.     Left Ear: Tympanic membrane and ear canal normal.     Mouth/Throat:     Mouth: Mucous membranes are moist.     Pharynx: Uvula midline.     Tonsils: 1+ on the right. 1+ on the left.  Eyes:     Conjunctiva/sclera: Conjunctivae normal.  Neck:     Thyroid: No thyromegaly.  Cardiovascular:     Rate and Rhythm: Normal rate and regular rhythm.     Heart sounds: No murmur heard.   Pulmonary:     Effort: Pulmonary effort is normal.     Breath sounds: Normal breath sounds.  Abdominal:     General: Bowel sounds are normal. There is no distension.     Palpations: Abdomen is soft. There is no mass.     Tenderness: There is no guarding or rebound.     Comments: Has mild to moderate tenderness throughout lower abd, worse on suprapubic region.   Musculoskeletal:     Cervical back: Neck supple.  Lymphadenopathy:     Cervical: Cervical adenopathy present.  Skin:    General: Skin is warm and dry.  Findings: No rash.   Neurological:     Mental Status: She is alert and oriented to person, place, and time.  Psychiatric:        Mood and Affect: Mood normal.        Behavior: Behavior normal.    UC Treatments / Results  Labs (all labs ordered are listed, but only abnormal results are displayed) Labs Reviewed  POCT URINALYSIS DIPSTICK, ED / UC - Abnormal; Notable for the following components:      Result Value   Hgb urine dipstick MODERATE (*)    All other components within normal limits  SARS CORONAVIRUS 2 (TAT 6-24 HRS)  CULTURE, GROUP A STREP ([facility])  URINE CULTURE  POCT RAPID STREP A, ED / UC  POC URINE PREG, ED  CERVICOVAGINAL ANCILLARY ONLY  Pregnancy test is neg  Rapid strep neg   EKG   Radiology No results found.  Procedures Procedures (including critical care time)  Medications Ordered in UC Medications - No data to display  Initial Impression / Assessment and Plan / UC Course  I have reviewed the triage vital signs and the nursing notes. Covid test and vaginal swab is pending.  Pertinent labs  results that were available during my care of the patient were reviewed by me and considered in my medical decision making (see chart for details).  May possibly have URI, urine was sent for culture and in the mean time I placed her on Macrobid and Pyridium. I sent Tessalon as noted for cough. See instructions.    Final Clinical Impressions(s) / UC Diagnoses   Final diagnoses:  Vaginal discharge  Viral URI with cough  Encounter for laboratory testing for COVID-19 virus  Pelvic pain in female  Microscopic hematuria  Nocturia     Discharge Instructions     The strep test is negative. The covid test is pending.  We will not get the results of the vaginal test and covid  til tomorrow and we will call you if the results are positive.  Ibuprofen for pelvic pain and fever I sent you Tessalon [place] for your cough. Your urine test has microscopic blood which could be part of an  infection, so I am sending this for a culture and in the mean time placing you on Macrobid antibiotic and pyridium for bladder pain.        ED Prescriptions    Medication Sig Dispense Auth. Provider   benzonatate (TESSALON) 200 MG capsule Take 1 capsule (200 mg total) by mouth 2 (two) times daily as needed for cough. 30 capsule Rodriguez-Southworth, Jacey Eckerson, PA-C   nitrofurantoin, macrocrystal-monohydrate, (MACROBID) 100 MG capsule Take 1 capsule (100 mg total) by mouth 2 (two) times daily. 10 capsule Rodriguez-Southworth, Nettie Elm, PA-C   phenazopyridine (PYRIDIUM) 200 MG tablet Take 1 tablet (200 mg total) by mouth 3 (three) times daily. For bladder pain 6 tablet Rodriguez-Southworth, Nettie Elm, PA-C     PDMP not reviewed this encounter.   Rodriguez-Southworth, Nettie Elm, PA-C 04/29/21 2000    Rodriguez-Southworth, [place], PA-C 04/29/21 2000

## 2021-04-29 NOTE — Discharge Instructions (Addendum)
The strep test is negative. The covid test is pending.  We will not get the results of the vaginal test and covid  til tomorrow and we will call you if the results are positive.  Ibuprofen for pelvic pain and fever I sent you Tessalon Perless for your cough. Your urine test has microscopic blood which could be part of an infection, so I am sending this for a culture and in the mean time placing you on Macrobid antibiotic and pyridium for bladder pain.

## 2021-04-30 LAB — SARS CORONAVIRUS 2 (TAT 6-24 HRS): SARS Coronavirus 2: POSITIVE — AB

## 2021-05-01 LAB — CERVICOVAGINAL ANCILLARY ONLY
Bacterial Vaginitis (gardnerella): NEGATIVE
Candida Glabrata: NEGATIVE
Candida Vaginitis: NEGATIVE
Chlamydia: NEGATIVE
Comment: NEGATIVE
Comment: NEGATIVE
Comment: NEGATIVE
Comment: NEGATIVE
Comment: NEGATIVE
Comment: NORMAL
Neisseria Gonorrhea: NEGATIVE
Trichomonas: NEGATIVE

## 2021-05-01 LAB — URINE CULTURE
Culture: NO GROWTH
Special Requests: NORMAL

## 2021-05-01 LAB — CULTURE, GROUP A STREP (THRC)

## 2021-05-02 LAB — CULTURE, GROUP A STREP (THRC)

## 2021-08-22 ENCOUNTER — Ambulatory Visit (HOSPITAL_COMMUNITY)
Admission: EM | Admit: 2021-08-22 | Discharge: 2021-08-22 | Disposition: A | Discharge status: Home / Self Care | Payer: 59 | Attending: Family Medicine | Admitting: Family Medicine

## 2021-08-22 ENCOUNTER — Encounter (HOSPITAL_COMMUNITY): Payer: Self-pay

## 2021-08-22 ENCOUNTER — Other Ambulatory Visit: Payer: Self-pay

## 2021-08-22 DIAGNOSIS — M25562 Pain in left knee: Secondary | ICD-10-CM

## 2021-08-22 DIAGNOSIS — M26602 Left temporomandibular joint disorder, unspecified: Secondary | ICD-10-CM | POA: Diagnosis not present

## 2021-08-22 DIAGNOSIS — M26609 Unspecified temporomandibular joint disorder, unspecified side: Secondary | ICD-10-CM

## 2021-08-22 DIAGNOSIS — M25561 Pain in right knee: Secondary | ICD-10-CM

## 2021-08-22 MED ORDER — MELOXICAM 15 MG PO TABS: 15.0000 mg | ORAL_TABLET | Freq: Every day | ORAL | 0 refills | Status: DC

## 2021-08-22 NOTE — ED Triage Notes (Signed)
Pt reports bilateral leg pain x 1 week. States pain started after she did squats with weights at the gym.   Pt report left ear pain x 1 month.

## 2021-08-22 NOTE — Discharge Instructions (Addendum)
As we discussed, your knee pain could be due to muscle imbalances, or degenerative changes from wear and tear.  We will treat you with an anti-inflammatory medicine that you can take daily.  You should take it scheduled every day for the next 7 days, then you can take it daily as needed.  You can also use ice and topical creams for pain such as IcyHot, Bengay, Voltaren gel.  If you do not have improvement in your pain over the next few weeks, you should be seen by your primary care provider.  Your ear exam was also normal, your pain was likely from your jaw.  To the meloxicam that we have given you should help with this as well.  Heat can also help at night to relax her jaw.  Follow-up with your primary care provider for this as well if it continues.

## 2021-08-22 NOTE — ED Provider Notes (Signed)
[facility]    CSN: [id number] Arrival date & time: 08/22/21  1527      History   Chief Complaint Chief Complaint  Patient presents with   Leg Pain    HPI Nancy Oconnor is a 35 y.o. female.   Bilateral Knee Pain Started 1 month ago Right first, then left Insidious onset No injury Does feel ike squats worsen the pain Pain is also worse going up and down stairs Positive theater sign Reports that pain is all over the anterior aspect of both of her knees She denies any swelling, locking, catching She denies any hip pain States that she has not taken anything for the pain  She also reports occasional pain in her left ear States that she has not any fevers and is otherwise been feeling well She wants her ear examined to make sure that her ear is okay   Past Medical History:  Diagnosis Date   Chest pain    limited exertion for past 4 days   Chills    Dry cough    2 years   Dyspnea    Headache    Nasal drainage    PID (pelvic inflammatory disease)     Patient Active Problem List   Diagnosis Date Noted   Complicated UTI (urinary tract infection) 03/10/2015   PID (acute pelvic inflammatory disease) 03/10/2015   Nausea with vomiting     Past Surgical History:  Procedure Laterality Date   CESAREAN SECTION     IUD REMOVAL N/A 10/08/2016   Procedure: INTRAUTERINE DEVICE (IUD) REMOVAL;  Surgeon: Edwinna Areola, DO;  Location: [facility];  Service: Gynecology;  Laterality: N/A;   LAPAROSCOPY N/A 10/08/2016   Procedure: LAPAROSCOPY DIAGNOSTIC;  Surgeon: Edwinna Areola, DO;  Location: [facility];  Service: Gynecology;  Laterality: N/A;   LYSIS OF ADHESION N/A 10/08/2016   Procedure: LYSIS OF ADHESION;  Surgeon: Edwinna Areola, DO;  Location: [facility];  Service: Gynecology;  Laterality: N/A;    OB History     Gravida  2   Para  2   Term  2   Preterm      AB      Living         SAB      IAB      Ectopic      Multiple      Live  Births  2            Home Medications    Prior to Admission medications   Medication Sig Start Date End Date Taking? Authorizing Provider  meloxicam (MOBIC) 15 MG tablet Take 1 tablet (15 mg total) by mouth daily. 08/22/21  Yes Genesis Paget, Solmon Ice, DO  benzonatate (TESSALON) 200 MG capsule Take 1 capsule (200 mg total) by mouth 2 (two) times daily as needed for cough. 04/29/21   Rodriguez-Southworth, Nettie Elm, PA-C  loratadine (CLARITIN) 10 MG tablet Take 1 tablet (10 mg total) by mouth daily. 03/10/19   Wieters, Hallie C, PA-C  nitrofurantoin, macrocrystal-monohydrate, (MACROBID) 100 MG capsule Take 1 capsule (100 mg total) by mouth 2 (two) times daily. 04/29/21   Rodriguez-Southworth, Nettie Elm, PA-C  ondansetron (ZOFRAN ODT) 4 MG disintegrating tablet Take 1 tablet (4 mg total) by mouth every 8 (eight) hours as needed for nausea or vomiting. 03/10/19   Wieters, Hallie C, PA-C  phenazopyridine (PYRIDIUM) 200 MG tablet Take 1 tablet (200 mg total) by mouth 3 (three) times daily. For bladder pain 04/29/21  Rodriguez-Southworth, Nettie Elm, PA-C  VITAMIN D PO Take by mouth.    [provider]    Family History Family History  Problem Relation Age of Onset   Hypertension Mother    Hypertension Father     Social History Social History   Tobacco Use   Smoking status: Never   Smokeless tobacco: Never  Substance Use Topics   Alcohol use: No   Drug use: No     Allergies   Other   Review of Systems Review of Systems  All other systems reviewed and are negative.  Per HPI Physical Exam Triage Vital Signs ED Triage Vitals  Enc Vitals Group     BP 08/22/21 1617 114/76     Pulse Rate 08/22/21 1617 92     Resp 08/22/21 1617 16     Temp 08/22/21 1617 98.1 F (36.7 C)     Temp src --      SpO2 08/22/21 1617 100 %     Weight --      Height --      Head Circumference --      Peak Flow --      Pain Score 08/22/21 1614 9     Pain Loc --      Pain Edu? --      Excl. in GC?  --    No data found.  Updated Vital Signs BP 114/76 (BP Location: Right Arm)   Pulse 92   Temp 98.1 F (36.7 C)   Resp 16   SpO2 100%   Visual Acuity Right Eye Distance:   Left Eye Distance:   Bilateral Distance:    Right Eye Near:   Left Eye Near:    Bilateral Near:     Physical Exam Constitutional:      General: She is not in acute distress.    Appearance: Normal appearance. She is not ill-appearing.  HENT:     Head: Normocephalic and atraumatic.     Right Ear: Tympanic membrane, ear canal and external ear normal. There is no impacted cerumen.     Left Ear: Tympanic membrane, ear canal and external ear normal. There is no impacted cerumen.     Ears:     Comments: She does have tenderness to palpation in the region of her left TMJ especially with opening of her mouth    Mouth/Throat:     Mouth: Mucous membranes are moist.  Pulmonary:     Effort: Pulmonary effort is normal.  Musculoskeletal:     Cervical back: Normal range of motion and neck supple. No rigidity or tenderness.     Comments: Bilateral Knee: - Inspection: no gross deformity b/l. No swelling/effusion, erythema or bruising b/l. Skin intact - Palpation: no TTP b/l - ROM: full active ROM with flexion and extension in knee and hip b/l - Strength: 5/5 strength b/l - Neuro/vasc: NV intact distally b/l - Special Tests: - LIGAMENTS: negative anterior and posterior drawer, negative Lachman's, no MCL or LCL laxity  -- MENISCUS: equivocal  McMurray's -- PF JOINT: nml patellar mobility bilaterally.  positive patellar grind, negative patellar apprehension  Hips: normal ROM   Lymphadenopathy:     Cervical: No cervical adenopathy.  Neurological:     Mental Status: She is alert.     UC Treatments / Results  Labs (all labs ordered are listed, but only abnormal results are displayed) Labs Reviewed - No data to display  EKG   Radiology No results found.  Procedures Procedures (including  critical care  time)  Medications Ordered in UC Medications - No data to display  Initial Impression / Assessment and Plan / UC Course  I have reviewed the triage vital signs and the nursing notes.  Pertinent labs & imaging results that were available during my care of the patient were reviewed by me and considered in my medical decision making (see chart for details).     Patient is a 35 year old previously healthy female who presents with bilateral knee pain that is left worse than right.  While she is rather difficult to examine due to her cooperation, her symptoms seem most consistent with patellofemoral versus early osteoarthritis versus degenerative meniscus tear.  We will treat her conservatively at first with meloxicam to take daily.  She was advised to avoid other NSAIDs while taking.  Advised her to follow-up with her primary care provider if she is not having improvement over the next few weeks, she states that she is set up for a new primary care provider.  Could obtain an over-the-counter knee brace if needed.  Can also ice her knees.  Reassured patient that her left ear is normal-appearing and no evidence of infection.  Her pain is most consistent with TMJ.  We will treat this with meloxicam as well.  She was discharged home in stable condition. Final Clinical Impressions(s) / UC Diagnoses   Final diagnoses:  Acute pain of both knees  TMJ (temporomandibular joint syndrome)     Discharge Instructions      As we discussed, your knee pain could be due to muscle imbalances, or degenerative changes from wear and tear.  We will treat you with an anti-inflammatory medicine that you can take daily.  You should take it scheduled every day for the next 7 days, then you can take it daily as needed.  You can also use ice and topical creams for pain such as IcyHot, Bengay, Voltaren gel.  If you do not have improvement in your pain over the next few weeks, you should be seen by your primary care provider.   Your ear exam was also normal, your pain was likely from your jaw.  To the meloxicam that we have given you should help with this as well.  Heat can also help at night to relax her jaw.  Follow-up with your primary care provider for this as well if it continues.     ED Prescriptions     Medication Sig Dispense Auth. Provider   meloxicam (MOBIC) 15 MG tablet Take 1 tablet (15 mg total) by mouth daily. 30 tablet Tyrel Lex, Solmon Ice, DO      PDMP not reviewed this encounter.   Nohlan Burdin, Solmon Ice, DO 08/22/21 1707

## 2021-09-20 ENCOUNTER — Other Ambulatory Visit: Payer: Self-pay

## 2021-09-20 ENCOUNTER — Encounter: Payer: Self-pay | Admitting: Family Medicine

## 2021-09-20 ENCOUNTER — Ambulatory Visit (INDEPENDENT_AMBULATORY_CARE_PROVIDER_SITE_OTHER): Payer: 59 | Admitting: Family Medicine

## 2021-09-20 VITALS — BP 109/85 | HR 71 | Ht 63.0 in | Wt 149.8 lb

## 2021-09-20 DIAGNOSIS — M7652 Patellar tendinitis, left knee: Secondary | ICD-10-CM

## 2021-09-20 DIAGNOSIS — M25561 Pain in right knee: Secondary | ICD-10-CM | POA: Diagnosis not present

## 2021-09-20 DIAGNOSIS — M25562 Pain in left knee: Secondary | ICD-10-CM

## 2021-09-20 DIAGNOSIS — M62838 Other muscle spasm: Secondary | ICD-10-CM

## 2021-09-20 DIAGNOSIS — Z23 Encounter for immunization: Secondary | ICD-10-CM | POA: Diagnosis not present

## 2021-09-20 DIAGNOSIS — Z0000 Encounter for general adult medical examination without abnormal findings: Secondary | ICD-10-CM | POA: Diagnosis not present

## 2021-09-20 DIAGNOSIS — M7651 Patellar tendinitis, right knee: Secondary | ICD-10-CM

## 2021-09-20 DIAGNOSIS — Z13220 Encounter for screening for lipoid disorders: Secondary | ICD-10-CM

## 2021-09-20 HISTORY — DX: Encounter for general adult medical examination without abnormal findings: Z00.00

## 2021-09-20 HISTORY — DX: Other muscle spasm: M62.838

## 2021-09-20 HISTORY — DX: Hypocalcemia: E83.51

## 2021-09-20 MED ORDER — CYCLOBENZAPRINE HCL 5 MG PO TABS: 5.0000 mg | ORAL_TABLET | Freq: Three times a day (TID) | ORAL | 1 refills | Status: DC | PRN

## 2021-09-20 NOTE — Assessment & Plan Note (Signed)
Left worse than right.  Recommend Tylenol ibuprofen for pain relief, icing for inflammation.  Showed her a patellar tendon strap that she may buy over-the-counter.  We will send for knee x-rays given bony pain in addition to patellar pain, although low likelihood of bony abnormality. If no improvement with conservative therapy, will refer to sports med.

## 2021-09-20 NOTE — Assessment & Plan Note (Signed)
Received flu shot today.  Obtained CBC and BMP today for baseline purposes given the patient's status.  Obtain lipid panel for cholesterol screening.  She last ate approximately 6 to 8 hours ago.

## 2021-09-20 NOTE — Assessment & Plan Note (Signed)
Neck pain most likely musculoskeletal in origin, suspect muscle spasms of cervical musculature.  Diffuse tenderness overlying midline and paraspinal regions.  No red flag symptoms including point tenderness, extremity weakness, paresthesias.  No previous imaging of neck despite extensive imaging history in chart.  Today recommend pain relief with Tylenol or ibuprofen, heat application, and Flexeril 5 mg as needed.  She declines PT today, saying she learned all stretches and can do them at home. Patient to follow-up if worsening or not improving.
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
## 2021-09-20 NOTE — Patient Instructions (Addendum)
It was wonderful to meet you today. Thank you for allowing me to be a part of your care. Below is a short summary of what we discussed at your visit today:  Knee pain I believe you have an inflammation of your patellar tendon called patellar tendinitis. - Try the patella support straps I showed you on the computer - I will send you for x-rays of your knees because you do have some pain over the bone.  You may walk-in to the [facility] imaging center at any time.    Neck pain Keep doing physical therapy stretches Try Flexeril 5 mg as needed for neck pain     Please bring all of your medications to every appointment!  If you have any questions or concerns, please do not hesitate to contact us via phone or MyChart message.    Fayette Pho, MD

## 2021-09-21 LAB — COMPREHENSIVE METABOLIC PANEL
ALT: 8 IU/L (ref 0–32)
AST: 15 IU/L (ref 0–40)
Albumin/Globulin Ratio: 1.4 (ref 1.2–2.2)
Albumin: 4.3 g/dL (ref 3.8–4.8)
Alkaline Phosphatase: 83 IU/L (ref 44–121)
BUN/Creatinine Ratio: 12 (ref 9–23)
BUN: 7 mg/dL (ref 6–20)
Bilirubin Total: <0.2 mg/dL (ref 0.0–1.2)
CO2: 24 mmol/L (ref 20–29)
Calcium: 9 mg/dL (ref 8.7–10.2)
Chloride: 100 mmol/L (ref 96–106)
Creatinine, Ser: 0.58 mg/dL (ref 0.57–1.00)
Globulin, Total: 3 g/dL (ref 1.5–4.5)
Glucose: 86 mg/dL (ref 70–99)
Potassium: 4.5 mmol/L (ref 3.5–5.2)
Sodium: 136 mmol/L (ref 134–144)
Total Protein: 7.3 g/dL (ref 6.0–8.5)
eGFR: 119 mL/min/{1.73_m2} (ref >59)

## 2021-09-21 LAB — CBC
Hematocrit: 39 % (ref 34.0–46.6)
Hemoglobin: 12.8 g/dL (ref 11.1–15.9)
MCH: 28.1 pg (ref 26.6–33.0)
MCHC: 32.8 g/dL (ref 31.5–35.7)
MCV: 86 fL (ref 79–97)
Platelets: 261 10*3/uL (ref 150–450)
RBC: 4.55 x10E6/uL (ref 3.77–5.28)
RDW: 12.4 % (ref 11.7–15.4)
WBC: 9.1 10*3/uL (ref 3.4–10.8)

## 2021-09-21 LAB — LIPID PANEL
Chol/HDL Ratio: 3.8 ratio (ref 0.0–4.4)
Cholesterol, Total: 237 mg/dL — ABNORMAL HIGH (ref 100–199)
HDL: 62 mg/dL (ref >39)
LDL Chol Calc (NIH): 150 mg/dL — ABNORMAL HIGH (ref 0–99)
Triglycerides: 138 mg/dL (ref 0–149)
VLDL Cholesterol Cal: 25 mg/dL (ref 5–40)

## 2021-09-22 ENCOUNTER — Encounter: Payer: Self-pay | Admitting: Family Medicine

## 2021-09-26 NOTE — Progress Notes (Signed)
    SUBJECTIVE:   CHIEF COMPLAINT / HPI:   Bilateral knee pain: Didn't go get x-rays.  She thought she was supposed to come back today to get those done.  She states that her knees continue to hurt.  Denies any trauma.  States that it primarily hurts in the patellar tendon region of the knee when she is doing certain exercises like stretches or squats.  She has been trying to stretch to improve the symptoms but this has not done a lot for the symptoms.  States that the left knee is worse than the left both of them give her trouble from time to time.  During her last appointment she was sent for x-rays but did not get those as mentioned above.  PERTINENT  PMH / PSH: None relevant  OBJECTIVE:   BP 116/84   Pulse 84   Ht 5\' 3"  (1.6 m)   Wt 148 lb 12.8 oz (67.5 kg)   SpO2 100%   BMI 26.36 kg/m    General: NAD, pleasant, able to participate in exam Respiratory: No respiratory distress Extremities: Some pain with palpation of the patellar tendon and medial and lateral joint lines bilaterally, no pain with palpation quadriceps tendon.  Negative anterior and posterior drawer testing.  No signs of Baker's cyst.  Negative McMurray's testing bilaterally. Psych: Normal affect and mood  ASSESSMENT/PLAN:   Patellar tendonitis of both knees Has been doing some stretches but is not gotten a lot of improvement from this.  She was not able to get x-rays because she thought she was supposed to come back here to get those done.  We will send her for those x-rays and recommend continuing to do conservative treatment.  Can consider physical therapy once x-rays are done and rule out other causes.    , DO [facility]

## 2021-09-29 ENCOUNTER — Ambulatory Visit (INDEPENDENT_AMBULATORY_CARE_PROVIDER_SITE_OTHER): Payer: 59 | Admitting: Family Medicine

## 2021-09-29 ENCOUNTER — Other Ambulatory Visit: Payer: Self-pay

## 2021-09-29 DIAGNOSIS — M7652 Patellar tendinitis, left knee: Secondary | ICD-10-CM

## 2021-09-29 DIAGNOSIS — M7651 Patellar tendinitis, right knee: Secondary | ICD-10-CM

## 2021-09-29 NOTE — Assessment & Plan Note (Signed)
Has been doing some stretches but is not gotten a lot of improvement from this.  She was not able to get x-rays because she thought she was supposed to come back here to get those done.  We will send her for those x-rays and recommend continuing to do conservative treatment.  Can consider physical therapy once x-rays are done and rule out other causes.

## 2021-09-29 NOTE — Patient Instructions (Signed)
I think you should get the x-rays that were ordered at the last appointment.  This was sent to [facility] imaging which is [address]  You can reach them at [phone].  You can just show up for this testing to be done.  I think we should get these x-rays then determine neck steps.  Continue working with ice and stretching of both of your legs.  I recommend that you follow-up with [place] once the x-rays are done first to determine neck steps.

## 2021-10-04 ENCOUNTER — Ambulatory Visit: Payer: 59 | Admitting: Student

## 2021-10-04 NOTE — Progress Notes (Deleted)
    SUBJECTIVE:   CHIEF COMPLAINT / HPI:   Vaginal Discharge: Patient is a 35 y.o. female presenting with vaginal discharge for *** days.  She states the discharge is of *** consistency.  She endorses *** vaginal odor.  She is not interested in screening for sexually transmitted infections today.  Last PAP 2017 normal  PERTINENT  PMH / PSH: ***None relevant  OBJECTIVE:   There were no vitals taken for this visit.   General: NAD, pleasant, able to participate in exam Respiratory: Normal effort, no obvious respiratory distress Pelvic: VULVA: normal appearing vulva with no masses, tenderness or lesions, VAGINA: Normal appearing vagina with normal color, no lesions, with {GYN VAGINAL DISCHARGE:21986} discharge present, ***CERVIX: No lesions, {GYN VAGINAL DISCHARGE:21986} discharge present,  Chaperone *** present for pelvic exam  ASSESSMENT/PLAN:   No problem-specific Assessment & Plan notes found for this encounter.    Assessment:  35 y.o. female with vaginal discharge for***days, as well as***.  Physical exam significant for*** discharge.  Wet prep performed today shows *** consistent with ***.  Patient is not interested in STI screening.   Plan: -Wet prep as above.  Will treat with***. -Follow-up as needed  Levin Erp, MD [facility]

## 2021-10-05 ENCOUNTER — Other Ambulatory Visit: Payer: Self-pay

## 2021-10-05 ENCOUNTER — Ambulatory Visit
Admission: RE | Admit: 2021-10-05 | Discharge: 2021-10-05 | Disposition: A | Discharge status: Home / Self Care | Payer: 59 | Source: Ambulatory Visit | Attending: Family Medicine | Admitting: Family Medicine

## 2021-10-05 DIAGNOSIS — M7651 Patellar tendinitis, right knee: Secondary | ICD-10-CM

## 2021-10-05 DIAGNOSIS — M25561 Pain in right knee: Secondary | ICD-10-CM

## 2021-10-09 ENCOUNTER — Telehealth: Payer: Self-pay | Admitting: Family Medicine

## 2021-10-09 ENCOUNTER — Other Ambulatory Visit: Payer: Self-pay | Admitting: Family Medicine

## 2021-10-09 DIAGNOSIS — M7652 Patellar tendinitis, left knee: Secondary | ICD-10-CM

## 2021-10-09 DIAGNOSIS — M7651 Patellar tendinitis, right knee: Secondary | ICD-10-CM

## 2021-10-09 NOTE — Telephone Encounter (Signed)
Called to discuss results of x-rays and sports med referral. A man answered the phone and said Ms. Farro was not there.   Fayette Pho, MD

## 2021-11-06 ENCOUNTER — Ambulatory Visit (HOSPITAL_COMMUNITY)
Admission: EM | Admit: 2021-11-06 | Discharge: 2021-11-06 | Disposition: A | Discharge status: Home / Self Care | Payer: 59 | Attending: Physician Assistant | Admitting: Physician Assistant

## 2021-11-06 ENCOUNTER — Ambulatory Visit (INDEPENDENT_AMBULATORY_CARE_PROVIDER_SITE_OTHER): Payer: 59

## 2021-11-06 ENCOUNTER — Encounter (HOSPITAL_COMMUNITY): Payer: Self-pay

## 2021-11-06 ENCOUNTER — Other Ambulatory Visit: Payer: Self-pay

## 2021-11-06 DIAGNOSIS — R051 Acute cough: Secondary | ICD-10-CM | POA: Diagnosis present

## 2021-11-06 DIAGNOSIS — J069 Acute upper respiratory infection, unspecified: Secondary | ICD-10-CM | POA: Insufficient documentation

## 2021-11-06 DIAGNOSIS — R0602 Shortness of breath: Secondary | ICD-10-CM | POA: Diagnosis not present

## 2021-11-06 DIAGNOSIS — R059 Cough, unspecified: Secondary | ICD-10-CM | POA: Diagnosis not present

## 2021-11-06 DIAGNOSIS — Z20822 Contact with and (suspected) exposure to covid-19: Secondary | ICD-10-CM | POA: Diagnosis not present

## 2021-11-06 DIAGNOSIS — Z28310 Unvaccinated for covid-19: Secondary | ICD-10-CM | POA: Diagnosis not present

## 2021-11-06 LAB — POC INFLUENZA A AND B ANTIGEN (URGENT CARE ONLY)
INFLUENZA A ANTIGEN, POC: NEGATIVE
INFLUENZA B ANTIGEN, POC: NEGATIVE

## 2021-11-06 MED ORDER — BENZONATATE 200 MG PO CAPS: 200.0000 mg | ORAL_CAPSULE | Freq: Three times a day (TID) | ORAL | 0 refills | Status: DC | PRN

## 2021-11-06 MED ORDER — PREDNISONE 20 MG PO TABS: 40.0000 mg | ORAL_TABLET | Freq: Every day | ORAL | 0 refills | Status: AC

## 2021-11-06 MED ORDER — PREDNISONE 20 MG PO TABS: 40.0000 mg | ORAL_TABLET | Freq: Every day | ORAL | 0 refills | Status: DC

## 2021-11-06 NOTE — ED Provider Notes (Signed)
[place]    CSN: [id number] Arrival date & time: 11/06/21  1700      History   Chief Complaint Chief Complaint  Patient presents with   Cough   Chest Pain    HPI Nancy Oconnor is a 35 y.o. female.   Patient presents today with a 2-day history of URI symptoms.  Reports cough, fever, nasal congestion, shortness of breath, chest discomfort.  Denies any nausea, vomiting.  She has tried Tylenol without improvement of symptoms.  Denies any known sick contacts.  Denies any history of allergies, asthma, COPD.  She does not smoke.  She has had influenza vaccine but has not had COVID-19 vaccination.  She has not had COVID in the past.  Denies history of diabetes.  Denies any recent antibiotic use.  She is having difficulty with daily activities as a result of symptoms.   Past Medical History:  Diagnosis Date   Chest pain    limited exertion for past 4 days   Chills    Complicated UTI (urinary tract infection) 03/10/2015   Dry cough    2 years   Dyspnea    Headache    Nasal drainage    Nausea with vomiting    PID (acute pelvic inflammatory disease) 03/10/2015   PID (pelvic inflammatory disease)     Patient Active Problem List   Diagnosis Date Noted   Muscle spasms of neck 09/20/2021   Patellar tendonitis of both knees 09/20/2021   Healthcare maintenance 09/20/2021   Hypocalcemia 09/20/2021    Past Surgical History:  Procedure Laterality Date   CESAREAN SECTION     IUD REMOVAL N/A 10/08/2016   Procedure: INTRAUTERINE DEVICE (IUD) REMOVAL;  Surgeon: Sherlyn Hay, DO;  Location: [facility];  Service: Gynecology;  Laterality: N/A;   LAPAROSCOPY N/A 10/08/2016   Procedure: LAPAROSCOPY DIAGNOSTIC;  Surgeon: Sherlyn Hay, DO;  Location: [facility];  Service: Gynecology;  Laterality: N/A;   LYSIS OF ADHESION N/A 10/08/2016   Procedure: LYSIS OF ADHESION;  Surgeon: Sherlyn Hay, DO;  Location: [facility];  Service: Gynecology;  Laterality: N/A;    OB  History     Gravida  2   Para  2   Term  2   Preterm      AB      Living         SAB      IAB      Ectopic      Multiple      Live Births  2            Home Medications    Prior to Admission medications   Medication Sig Start Date End Date Taking? Authorizing Provider  benzonatate (TESSALON) 200 MG capsule Take 1 capsule (200 mg total) by mouth 3 (three) times daily as needed for cough. 11/06/21   Valli Randol, Derry Skill, PA-C  cyclobenzaprine (FLEXERIL) 5 MG tablet Take 1 tablet (5 mg total) by mouth 3 (three) times daily as needed for muscle spasms. 09/20/21   Ezequiel Essex, MD  loratadine (CLARITIN) 10 MG tablet Take 1 tablet (10 mg total) by mouth daily. 03/10/19   Wieters, Hallie C, PA-C  meloxicam (MOBIC) 15 MG tablet Take 1 tablet (15 mg total) by mouth daily. 08/22/21   Meccariello, Bernita Raisin, DO  nitrofurantoin, macrocrystal-monohydrate, (MACROBID) 100 MG capsule Take 1 capsule (100 mg total) by mouth 2 (two) times daily. 04/29/21   Rodriguez-Southworth, Sunday Spillers, PA-C  ondansetron (ZOFRAN ODT) 4 MG  disintegrating tablet Take 1 tablet (4 mg total) by mouth every 8 (eight) hours as needed for nausea or vomiting. 03/10/19   Wieters, Hallie C, PA-C  phenazopyridine (PYRIDIUM) 200 MG tablet Take 1 tablet (200 mg total) by mouth 3 (three) times daily. For bladder pain 04/29/21   Rodriguez-Southworth, Nettie Elm, PA-C  predniSONE (DELTASONE) 20 MG tablet Take 2 tablets (40 mg total) by mouth daily for 4 days. 11/06/21 11/10/21  Katonya Blecher, Noberto Retort, PA-C  VITAMIN D PO Take by mouth.    [provider]    Family History Family History  Problem Relation Age of Onset   Hypertension Mother    Hypertension Father     Social History Social History   Tobacco Use   Smoking status: Never   Smokeless tobacco: Never  Substance Use Topics   Alcohol use: No   Drug use: No     Allergies   Other   Review of Systems Review of Systems  Constitutional:  Positive for activity  change, fatigue and fever. Negative for appetite change.  HENT:  Positive for congestion and sore throat. Negative for sinus pressure and sneezing.   Respiratory:  Positive for cough, chest tightness and shortness of breath.   Cardiovascular:  Negative for chest pain.  Gastrointestinal:  Negative for abdominal pain, diarrhea, nausea and vomiting.  Musculoskeletal:  Positive for arthralgias and myalgias.  Neurological:  Positive for headaches. Negative for dizziness and light-headedness.    Physical Exam Triage Vital Signs ED Triage Vitals  Enc Vitals Group     BP 11/06/21 1846 (!) 117/92     Pulse Rate 11/06/21 1845 88     Resp 11/06/21 1845 18     Temp 11/06/21 1845 98.1 F (36.7 C)     Temp Source 11/06/21 1845 Oral     SpO2 11/06/21 1845 100 %     Weight --      Height --      Head Circumference --      Peak Flow --      Pain Score 11/06/21 1844 5     Pain Loc --      Pain Edu? --      Excl. in GC? --    No data found.  Updated Vital Signs BP (!) 117/92   Pulse 88   Temp 98.1 F (36.7 C) (Oral)   Resp 18   LMP 10/27/2021 (Exact Date)   SpO2 100%   Visual Acuity Right Eye Distance:   Left Eye Distance:   Bilateral Distance:    Right Eye Near:   Left Eye Near:    Bilateral Near:     Physical Exam Vitals reviewed.  Constitutional:      General: She is awake. She is not in acute distress.    Appearance: Normal appearance. She is well-developed. She is not ill-appearing.     Comments: Very pleasant female appears stated age in no acute distress sitting comfortably in exam room  HENT:     Head: Normocephalic and atraumatic.     Right Ear: Tympanic membrane, ear canal and external ear normal. Tympanic membrane is not erythematous or bulging.     Left Ear: Tympanic membrane, ear canal and external ear normal. Tympanic membrane is not erythematous or bulging.     Nose:     Right Sinus: No maxillary sinus tenderness or frontal sinus tenderness.     Left Sinus: No  maxillary sinus tenderness or frontal sinus tenderness.     Mouth/Throat:  Pharynx: Uvula midline. No oropharyngeal exudate or posterior oropharyngeal erythema.  Cardiovascular:     Rate and Rhythm: Normal rate and regular rhythm.     Heart sounds: Normal heart sounds, S1 normal and S2 normal. No murmur heard. Pulmonary:     Effort: Pulmonary effort is normal.     Breath sounds: Examination of the right-lower field reveals rhonchi. Rhonchi present. No wheezing or rales.  Psychiatric:        Behavior: Behavior is cooperative.     UC Treatments / Results  Labs (all labs ordered are listed, but only abnormal results are displayed) Labs Reviewed  SARS CORONAVIRUS 2 (TAT 6-24 HRS)  POC INFLUENZA A AND B ANTIGEN (URGENT CARE ONLY)    EKG   Radiology No results found.  Procedures Procedures (including critical care time)  Medications Ordered in UC Medications - No data to display  Initial Impression / Assessment and Plan / UC Course  I have reviewed the triage vital signs and the nursing notes.  Pertinent labs & imaging results that were available during my care of the patient were reviewed by me and considered in my medical decision making (see chart for details).     Discussed likely viral etiology.  No evidence of acute infection that warrant initiation of antibiotics.  Flu test was negative.  Chest x-ray was obtained and that was normal.  COVID test is pending.  Patient was started on prednisone to help with symptoms with instruction not to take NSAIDs with this medication due to risk of GI bleeding.  She was given Tessalon for cough.  Recommend she use over-the-counter medications including Tylenol, Mucinex, Flonase for additional symptom relief.  She is to rest and drink plenty of fluid.  Discussed alarm symptoms that warrant emergent evaluation.  Strict return precautions given to which she expressed understanding.  Final Clinical Impressions(s) / UC Diagnoses   Final  diagnoses:  Viral URI with cough     Discharge Instructions      Your flu test was negative.  We will contact you if COVID is positive.  Your chest x-ray looked normal.  Please start Tessalon up to 3 times a day as needed for cough.  Start prednisone as prescribed.  Do not take NSAIDs including aspirin, ibuprofen/Advil, naproxen/Aleve with this medication as can cause stomach bleeding.  Use Tylenol, Mucinex, Flonase for symptom relief.  Make sure you rest and drink plenty of fluid.     ED Prescriptions     Medication Sig Dispense Auth. Provider   benzonatate (TESSALON) 200 MG capsule  (Status: Discontinued) Take 1 capsule (200 mg total) by mouth 3 (three) times daily as needed for cough. 30 capsule Clayson Riling K, PA-C   predniSONE (DELTASONE) 20 MG tablet  (Status: Discontinued) Take 2 tablets (40 mg total) by mouth daily for 4 days. 8 tablet Artia Singley K, PA-C   benzonatate (TESSALON) 200 MG capsule Take 1 capsule (200 mg total) by mouth 3 (three) times daily as needed for cough. 30 capsule Anjel Perfetti K, PA-C   predniSONE (DELTASONE) 20 MG tablet Take 2 tablets (40 mg total) by mouth daily for 4 days. 8 tablet Claudie Brickhouse, Derry Skill, PA-C      PDMP not reviewed this encounter.   Terrilee Croak, PA-C 11/06/21 2112

## 2021-11-06 NOTE — Discharge Instructions (Addendum)
Your flu test was negative.  We will contact you if COVID is positive.  Your chest x-ray looked normal.  Please start Tessalon up to 3 times a day as needed for cough.  Start prednisone as prescribed.  Do not take NSAIDs including aspirin, ibuprofen/Advil, naproxen/Aleve with this medication as can cause stomach bleeding.  Use Tylenol, Mucinex, Flonase for symptom relief.  Make sure you rest and drink plenty of fluid.

## 2021-11-06 NOTE — ED Triage Notes (Signed)
Pt presents with a cough and fever x 2 days.   States she has a headache. States her chest hurts when coughing.

## 2021-11-07 LAB — SARS CORONAVIRUS 2 (TAT 6-24 HRS): SARS Coronavirus 2: NEGATIVE

## 2021-12-15 ENCOUNTER — Other Ambulatory Visit: Payer: Self-pay

## 2022-01-05 ENCOUNTER — Other Ambulatory Visit: Payer: Self-pay

## 2022-01-05 ENCOUNTER — Ambulatory Visit: Payer: Medicaid Other | Admitting: Family Medicine

## 2022-01-05 ENCOUNTER — Ambulatory Visit (INDEPENDENT_AMBULATORY_CARE_PROVIDER_SITE_OTHER): Payer: 59 | Admitting: Family Medicine

## 2022-01-05 DIAGNOSIS — L219 Seborrheic dermatitis, unspecified: Secondary | ICD-10-CM | POA: Diagnosis not present

## 2022-01-05 DIAGNOSIS — M7652 Patellar tendinitis, left knee: Secondary | ICD-10-CM

## 2022-01-05 DIAGNOSIS — M7651 Patellar tendinitis, right knee: Secondary | ICD-10-CM

## 2022-01-05 MED ORDER — KETOCONAZOLE 2 % EX SHAM: 1.0000 "application " | MEDICATED_SHAMPOO | CUTANEOUS | 0 refills | Status: DC

## 2022-01-05 NOTE — Progress Notes (Signed)
° ° °  SUBJECTIVE:   CHIEF COMPLAINT / HPI:   Patient presents with itching on her scalp. Noticed patch on her scalp for the past 2 weeks that has worsened, has not used any shampoos or other products. Denies ever having anything like this before. Denies any other symptoms other than itching. No one else at home has this.    She was also seen for knee pain previously. Endorsing prior leg pain in both of her knees that is worse when she works out at [employer]. Has been doing the home stretching exercises that have been helping. But endorsing that her pain is the same. Still going to the gym 3 days a week. Pain is worse after she goes to the gym. Denies radiating pain, numbness and tingling. Rates pain 6/10 that occurs intermittently worse after working out. She does not take anything for the pain as she does not like to take medications. She would like to hold off on PT and does not want to take any medications.   OBJECTIVE:   General: Patient well-appearing, in no acute distress.  HEENT: 2 cm circumscribed erythematous flaky patch noted along occipital region CV: RRR, no murmurs or gallops auscultated Resp: CTAB MSK: no gross deformity, erythema or edema noted no tenderness along palpation of knee joints bilaterally or within popliteal fossa full active range of motion intact negative valgus, varus, McMurray's, anterior and posterior drawer testing  Neuro: 5/5 LE strength bilaterally   ASSESSMENT/PLAN:   Seborrheic dermatitis of scalp -ketoconazole prescribed   Patellar tendonitis of both knees -handout provided with stretching exercises, instructed to do these along with tylenol or ibuprofen as needed, instructed to rest from gym workouts for a week to allow healing and resolution of pain  -x-ray imaging reviewed, mild effusion of right knee although this is not painful to the patient  -patient politely declines PT at this time due to financial reasons and does not want to take medication  at this time, at follow up consider PT referral and meloxicam if patient is agreeable     PHQ-9 not completed as patient politely declined participation.   Nancy Oconnor, Nancy Oconnor

## 2022-01-05 NOTE — Patient Instructions (Addendum)
It was great seeing you today!  Today we discussed your knees, please take a rest for the next week from going to the gym. Continue to do the stretching exercises provided at home. You may take ibuprofen or tylenol for the pain for the next week.   If the pain continues after a week then please come see [place], we may need to do formal physical therapy.   Regarding the area on your scalp, this is seborrheic dermatitis. I have prescribed ketoconazole. Please use this 2 times a week.   Please follow up at your next scheduled appointment, if anything arises between now and then, please don't hesitate to contact our office.   Thank you for allowing [place] to be a part of your medical care!  Thank you, Dr. Robyne Peers

## 2022-01-06 DIAGNOSIS — L219 Seborrheic dermatitis, unspecified: Secondary | ICD-10-CM

## 2022-01-06 HISTORY — DX: Seborrheic dermatitis, unspecified: L21.9

## 2022-01-06 NOTE — Assessment & Plan Note (Signed)
-  ketoconazole prescribed

## 2022-01-06 NOTE — Assessment & Plan Note (Signed)
-  handout provided with stretching exercises, instructed to do these along with tylenol or ibuprofen as needed, instructed to rest from gym workouts for a week to allow healing and resolution of pain  -x-ray imaging reviewed, mild effusion of right knee although this is not painful to the patient  -patient politely declines PT at this time due to financial reasons and does not want to take medication at this time, at follow up consider PT referral and meloxicam if patient is agreeable

## 2022-02-06 ENCOUNTER — Other Ambulatory Visit: Payer: Self-pay

## 2022-02-06 ENCOUNTER — Telehealth: Payer: Self-pay | Admitting: Family Medicine

## 2022-02-06 ENCOUNTER — Ambulatory Visit (INDEPENDENT_AMBULATORY_CARE_PROVIDER_SITE_OTHER): Payer: 59 | Admitting: Family Medicine

## 2022-02-06 VITALS — BP 100/65 | HR 88 | Ht 63.0 in | Wt 150.0 lb

## 2022-02-06 DIAGNOSIS — R300 Dysuria: Secondary | ICD-10-CM | POA: Diagnosis not present

## 2022-02-06 DIAGNOSIS — B3731 Acute candidiasis of vulva and vagina: Secondary | ICD-10-CM | POA: Insufficient documentation

## 2022-02-06 DIAGNOSIS — N898 Other specified noninflammatory disorders of vagina: Secondary | ICD-10-CM | POA: Diagnosis not present

## 2022-02-06 DIAGNOSIS — N3001 Acute cystitis with hematuria: Secondary | ICD-10-CM

## 2022-02-06 HISTORY — DX: Acute candidiasis of vulva and vagina: B37.31

## 2022-02-06 HISTORY — DX: Dysuria: R30.0

## 2022-02-06 LAB — POCT WET PREP (WET MOUNT)
Clue Cells Wet Prep Whiff POC: NEGATIVE
Trichomonas Wet Prep HPF POC: ABSENT

## 2022-02-06 LAB — POCT URINALYSIS DIP (MANUAL ENTRY)
Bilirubin, UA: NEGATIVE
Glucose, UA: NEGATIVE mg/dL
Ketones, POC UA: NEGATIVE mg/dL
Leukocytes, UA: NEGATIVE
Nitrite, UA: NEGATIVE
Protein Ur, POC: NEGATIVE mg/dL
Spec Grav, UA: 1.02 (ref 1.010–1.025)
Urobilinogen, UA: 0.2 E.U./dL
pH, UA: 6 (ref 5.0–8.0)

## 2022-02-06 MED ORDER — NYSTATIN 100000 UNIT/GM EX POWD: 1.0000 "application " | Freq: Three times a day (TID) | CUTANEOUS | 0 refills | Status: DC

## 2022-02-06 MED ORDER — NITROFURANTOIN MONOHYD MACRO 100 MG PO CAPS: 100.0000 mg | ORAL_CAPSULE | Freq: Two times a day (BID) | ORAL | 0 refills | Status: DC

## 2022-02-06 MED ORDER — FLUCONAZOLE 150 MG PO TABS: 150.0000 mg | ORAL_TABLET | Freq: Once | ORAL | 0 refills | Status: AC

## 2022-02-06 MED ORDER — CEPHALEXIN 500 MG PO CAPS: 500.0000 mg | ORAL_CAPSULE | Freq: Two times a day (BID) | ORAL | 0 refills | Status: AC

## 2022-02-06 NOTE — Progress Notes (Signed)
° ° °  SUBJECTIVE:   CHIEF COMPLAINT / HPI:   Vaginal and urinary complaints - patient reports PAP smear with her OBGYN 4 months ago - vaginal and labial pruritis x1 week, but worsening - dysuria, burning with urination - colicky pain of bilateral suprapubic area, bilateral flank pain - chills but no fever  PERTINENT  PMH / PSH:  Patient Active Problem List   Diagnosis Date Noted   Vaginal candidiasis 02/06/2022   Dysuria 02/06/2022   Seborrheic dermatitis of scalp 01/06/2022   Muscle spasms of neck 09/20/2021   Patellar tendonitis of both knees 09/20/2021   Healthcare maintenance 09/20/2021   Hypocalcemia 09/20/2021    OBJECTIVE:   BP 100/65    Pulse 88    Ht 5\' 3"  (1.6 m)    Wt 150 lb (68 kg)    LMP 01/14/2022    SpO2 99%    BMI 26.57 kg/m   Physical Exam General: Awake, alert, oriented, no acute distress Respiratory: Normal work of breathing, no respiratory distress Neuro: Cranial nerves II through X grossly intact, able to move all extremities spontaneously Vulva: Normal appearing vulva without rashes, lesions, or deformities Vagina: Pale pink rugated vaginal tissue without obvious lesions, thick white curd like vaginal discharge, cervix without lesion or overt tenderness with swab  ASSESSMENT/PLAN:   Vaginal candidiasis Acute, worsening over 1 week. Wet prep collected. Patient reports pruritus both internally and externally.  Rx fluconazole 150 mg x 1 and nystatin powder.  Dysuria Acute, worsening over 1 week.  Patient reports chills, bilateral flank pain, suprapubic pain.  UA negative for nitrites leukocytes, but has small amount of blood.  Despite negative UA, given symptoms we will treat with Macrobid x7 days.  Urine sent for culture.     03/14/2022, MD [facility]

## 2022-02-06 NOTE — Telephone Encounter (Signed)
Called patient to discuss change from macrobid to keflex. All questions answered. Patient amenable.  Fayette Pho, MD

## 2022-02-06 NOTE — Assessment & Plan Note (Signed)
Acute, worsening over 1 week.  Patient reports chills, bilateral flank pain, suprapubic pain.  UA negative for nitrites leukocytes, but has small amount of blood.  Despite negative UA, given symptoms we will treat with Macrobid x7 days.  Urine sent for culture.

## 2022-02-06 NOTE — Progress Notes (Signed)
Macrobid changed to Keflex given s/s possible pyelo. Called pharmacy to change.  Ezequiel Essex, MD

## 2022-02-06 NOTE — Patient Instructions (Addendum)
It was wonderful to see you today. Thank you for allowing me to be a part of your care. Below is a short summary of what we discussed at your visit today:  Vaginal Complaint Today we obtained a vaginal swab to test for causes of this vaginal itching. We will follow up the results later today. You should be able to access them on MyChart. If it is positive and you need medication, I will give you a call.   I am going to treat you for both a yeast infection and a urine infection. UTI: Take Keflex twice daily for total of 10 days.  I have sent your urine off for culture.  The results should be available on MyChart. Yeast infection: Take the fluconazole pill.  This is a one-time dose.  I have also written for nystatin powder for use in the inguinal areas.  Do not use the nystatin powder internally.  Health Maintenance We like to think about ways to keep you healthy for years to come. Below are some interventions and screenings we can offer to keep you healthy: - Hepatitis C screening (recommended once in a lifetime for all adults) - COVID vaccination   Please bring all of your medications to every appointment!  If you have any questions or concerns, please do not hesitate to contact us via phone or MyChart message.   Fayette Pho, MD

## 2022-02-06 NOTE — Assessment & Plan Note (Addendum)
Acute, worsening over 1 week. Wet prep collected. Patient reports pruritus both internally and externally.  Rx fluconazole 150 mg x 1 and nystatin powder.

## 2022-02-06 NOTE — Addendum Note (Signed)
Addended by: Valetta Close on: 02/06/2022 11:34 AM   Modules accepted: Orders

## 2022-02-08 LAB — URINE CULTURE: Organism ID, Bacteria: NO GROWTH

## 2022-04-19 ENCOUNTER — Ambulatory Visit (INDEPENDENT_AMBULATORY_CARE_PROVIDER_SITE_OTHER): Payer: 59 | Admitting: Family Medicine

## 2022-04-19 VITALS — BP 103/78 | HR 88 | Ht 63.0 in | Wt 150.2 lb

## 2022-04-19 DIAGNOSIS — L299 Pruritus, unspecified: Secondary | ICD-10-CM | POA: Diagnosis not present

## 2022-04-19 DIAGNOSIS — W57XXXA Bitten or stung by nonvenomous insect and other nonvenomous arthropods, initial encounter: Secondary | ICD-10-CM

## 2022-04-19 DIAGNOSIS — Z30019 Encounter for initial prescription of contraceptives, unspecified: Secondary | ICD-10-CM | POA: Diagnosis not present

## 2022-04-19 LAB — POCT URINE PREGNANCY: Preg Test, Ur: NEGATIVE

## 2022-04-19 MED ORDER — NORELGESTROMIN-ETH ESTRADIOL 150-35 MCG/24HR TD PTWK: 1.0000 | MEDICATED_PATCH | TRANSDERMAL | 11 refills | Status: DC

## 2022-04-19 MED ORDER — TRIAMCINOLONE ACETONIDE 0.1 % EX OINT: 1.0000 "application " | TOPICAL_OINTMENT | Freq: Two times a day (BID) | CUTANEOUS | 1 refills | Status: DC

## 2022-04-19 NOTE — Patient Instructions (Addendum)
Stop by the pharmacy to pick up your medications.   ? ?Apply the cream twice a day as needed for itching. You can take benadryl 25 mg (1 tablet) for itching at bedtime.  ? ?I am concerned that you or someone in your household has come in contact with bed bugs.  They are small bugs that bite mostly at night. You may intense itching due to their bite.   ? ?Removing them may take time.  Wash bedding and clothes in hot water for 30 minutes. Then put them in a dryer on the highest heat setting for at least 30 minutes.  ? ?Use a steamer on mattresses, couches, and other places where bedbugs hide. You can purchase a small steamer on [employer] or at the store.  ? ?Pack up infested items in black bags and leave them outside on a hot day that reaches 95?F (35?C) or in a closed car. ? ? ?

## 2022-04-19 NOTE — Progress Notes (Signed)
? ?  SUBJECTIVE:  ? ?CHIEF COMPLAINT / HPI:  ? ?Chief Complaint  ?Patient presents with  ? Rash  ? ? ?Nancy Oconnor is a 36 y.o. female here for itching  Pt reports she and her son has been itching for the past 2 days. Son has had small amount of bleeding. Itching is worse at night. She and her daughter not they saw a very small bug. Daughter's friend and neighbor have similar itching.  ? ?[name] has several red itchy spots on her neck, extremities and trunk. Denies fever, vomiting, headache, abdominal pain. She has not tried any treatment prior to arrival.  ? ?Pt requests refill on her birth control patch. She states her GYN told her she should be seen in the office.  ? ? ?PERTINENT  PMH / PSH: reviewed and updated as appropriate  ? ?OBJECTIVE:  ? ?BP 103/78   Pulse 88   Ht 5\' 3"  (1.6 m)   Wt 150 lb 4 oz (68.2 kg)   LMP 04/05/2022   SpO2 100%   BMI 26.62 kg/m?   ? ?GEN: well appearing female in no acute distress  ?CVS: well perfused  ?RESP: speaking in full sentences without pause, no respiratory distress  ?SKIN: erythematous macules and papules diffusely spread  ? ?ASSESSMENT/PLAN:  ? ? ?Rash  Itching due to insect  ?Patient with history and exam concerning for bed bugs.  Discussed symptomatic treatment. Rx steroid ointment sent to pharmacy. Recommended washing and then drying household items on high hit, applying a mattress pad cover and steaming her furniture.  See AVS for additional details.   ? ?Encounter for contraception ?Urine pregnancy test negative.  Refilled Ortho-Evra patch. Patient has no contraindications for estrogen therapy.   ? ?04/07/2022, DO   ?PGY-3, [facility] ?04/19/2022  ? ? ? ? ? ? ? ?

## 2022-04-26 ENCOUNTER — Telehealth: Payer: Self-pay

## 2022-04-26 ENCOUNTER — Ambulatory Visit (INDEPENDENT_AMBULATORY_CARE_PROVIDER_SITE_OTHER): Payer: 59 | Admitting: Family Medicine

## 2022-04-26 VITALS — BP 111/77 | HR 112 | Temp 98.5°F | Ht 63.0 in | Wt 149.0 lb

## 2022-04-26 DIAGNOSIS — J069 Acute upper respiratory infection, unspecified: Secondary | ICD-10-CM | POA: Diagnosis not present

## 2022-04-26 MED ORDER — HYDROCOD POLI-CHLORPHE POLI ER 10-8 MG/5ML PO SUER: 5.0000 mL | Freq: Every evening | ORAL | 0 refills | Status: DC | PRN

## 2022-04-26 NOTE — Addendum Note (Signed)
Addended by: Francene Castle on: 04/26/2022 01:26 PM   Modules accepted: Orders

## 2022-04-26 NOTE — Patient Instructions (Addendum)
I think that you currently have a viral infection, if it does not get better (or if it gets worse) over the next several days then we may need to check-in on Monday to make sure we are not concerned about a bacterial infection.  Things to try: - Honey with hot tea - Chloraseptic spray - Tylenol and Ibuprofen as needed - I am sending in a prescription for Tussionex, this does have a narcotic in it to help with the pain. DO NOT let other people use this (especially children). ONLY take this before bedtime and don't drive after using.   When to go to the ER: - Not able to drink anything  - Severe vomiting and diarrhea - Worsening shortness of breath and cough - Not urinating during the day    Please follow-up with your primary care doctor in the next week for your other concerns.

## 2022-04-26 NOTE — Telephone Encounter (Signed)
Patient calls nurse line reporting [facility] has no stock of Tussionex.   [facility] [address] does [facility] stock.   I have cancelled out prescription at [facility].   Will forward to provider who saw patient.

## 2022-04-26 NOTE — Progress Notes (Signed)
    SUBJECTIVE:   CHIEF COMPLAINT / HPI:   Patient reports over the last 2 days she has had fever, cough, headache, decreased appetite, sore throat.  She denies any vomiting or diarrhea.  No recent sick contacts.  Is not actually sure if she has been having fevers that she does not remember the temperatures when she has taken it.  She does feel like she has not been able to eat or drink much due to some throat pain and her cough.  She has been taking OTC cold and flu nighttime cough syrup with some mild improvement.  Patient does also note some other issues that she would like to follow-up with her PCP regarding her continued itching, C-section pain.  PERTINENT  PMH / PSH: Reviewed  OBJECTIVE:   BP 111/77   Pulse (!) 112   Temp 98.5 F (36.9 C) (Oral)   Ht 5\' 3"  (1.6 m)   Wt 149 lb (67.6 kg)   LMP 04/05/2022 (Exact Date)   SpO2 99%   BMI 26.39 kg/m   Gen: Appears mildly ill, no acute distress HEENT: Mild conjunctival injection, oropharynx with mild erythema but no tonsillar exudates, no uvular deviation, mild anterior cervical lymphadenopathy, TMs slightly bulging bilaterally without erythema or signs of infection CV: regular rate, mild tachycardia in the low 100s, no m/r/g appreciated, no peripheral edema Pulm: CTAB, no wheezes/crackles GI: soft, non-tender, non-distended  ASSESSMENT/PLAN:   URI, presumed viral Patient with history and physical concerning for a possible viral URI.  Reassuringly there is no focal consolidation on auscultation and no tonsillar exudates so did not swab for strep (especially given history of cough).  Given that patient is on day 3 of symptoms, did not swab for flu as treatment would not be indicated at this time.  Consideration for sinusitis, but patient has not had longer course to presume bacterial etiology. - URI supportive care handout given - Strict ER and return precautions given - Recommend trial of Chloraseptic Spray for throat pain -  Tussionex cough syrup QHS PRN (strict precautions given not to administer to anyone else, including children and to only take at night with no driving after taking) - Follow-up in the next 3 to 5 days if no improvement or if worsening   Latice Waitman, DO  [facility]

## 2022-05-15 ENCOUNTER — Encounter: Payer: Self-pay | Admitting: *Deleted

## 2022-05-30 NOTE — Progress Notes (Deleted)
    SUBJECTIVE:   CHIEF COMPLAINT / HPI:   Hit head: patient reports ***  Pap smear due  PERTINENT  PMH / PSH: ***  OBJECTIVE:   There were no vitals taken for this visit.  ***  ASSESSMENT/PLAN:   No problem-specific Assessment & Plan notes found for this encounter.     Shirlean Mylar, MD [facility]

## 2022-05-31 ENCOUNTER — Ambulatory Visit (INDEPENDENT_AMBULATORY_CARE_PROVIDER_SITE_OTHER): Payer: 59 | Admitting: Family Medicine

## 2022-05-31 VITALS — BP 100/70 | HR 74 | Ht 63.0 in | Wt 150.0 lb

## 2022-05-31 DIAGNOSIS — S0990XA Unspecified injury of head, initial encounter: Secondary | ICD-10-CM

## 2022-05-31 NOTE — Patient Instructions (Signed)
I am sorry that you hit your head.  To help with the swelling please use ice on the bump.  You can also take Advil as needed but no more than 7 to 10 days for pain and inflammation.  If you continue to have vomiting please go to the emergency department to get your head imaged.  If you continue to have other symptoms this time next week follow-up.  Please call the clinic at [phone] if your symptoms worsen or you have any concerns. It was our pleasure to serve you.  Dr. Salvadore Dom

## 2022-05-31 NOTE — Progress Notes (Unsigned)
    SUBJECTIVE:   CHIEF COMPLAINT / HPI:   ***  PERTINENT  PMH / PSH: ***  OBJECTIVE:   BP 100/70   Pulse 74   Ht 5\' 3"  (1.6 m)   Wt 150 lb (68 kg)   LMP 05/26/2022   SpO2 99%   BMI 26.57 kg/m   Vision Screening   Right eye Left eye Both eyes  Without correction 20/20 20/20 20/20   With correction        ASSESSMENT/PLAN:   No problem-specific Assessment & Plan notes found for this encounter.     05/28/2022, DO [facility]

## 2022-07-16 ENCOUNTER — Encounter: Payer: Self-pay | Admitting: Family Medicine

## 2022-07-16 ENCOUNTER — Ambulatory Visit (INDEPENDENT_AMBULATORY_CARE_PROVIDER_SITE_OTHER): Payer: 59 | Admitting: Family Medicine

## 2022-07-16 VITALS — BP 120/78 | HR 90 | Ht 63.0 in | Wt 148.0 lb

## 2022-07-16 DIAGNOSIS — R1031 Right lower quadrant pain: Secondary | ICD-10-CM | POA: Insufficient documentation

## 2022-07-16 LAB — POCT URINE PREGNANCY: Preg Test, Ur: NEGATIVE

## 2022-07-16 NOTE — Patient Instructions (Signed)
It was wonderful to see you today. Thank you for allowing me to be a part of your care. Below is a short summary of what we discussed at your visit today:  Abdominal Pain  I think you either have a small hernia or a muscle strain of your abdominal wall.  Limit your lifting - do not lift more than 15 pounds for the next 3 weeks.  Avoid activities that make your abdominal pain worse.   Today we obtained a urine sample to test for pregnancy (as we do for everyone who could possibly be pregnant and has abdominal pain). The results will be available on MyChart. If the results are normal, I will send you a letter or MyChart message. If the results are abnormal, I will give you a call.    Come back if your pain has not improved by the end of your rest period.   Health Maintenance We like to think about ways to keep you healthy for years to come. Below are some interventions and screenings we can offer to keep you healthy: - PAP smear (please schedule with [place] at your earliest convenience) - influenza vaccine (please check in with [place] end of August or early September to see if we have the vaccines in yet) - Hepatitis C screening (recommended for everyone at least once in their life)  Please bring all of your medications to every appointment!  If you have any questions or concerns, please do not hesitate to contact us via phone or MyChart message.   Fayette Pho, MD

## 2022-07-16 NOTE — Progress Notes (Signed)
    SUBJECTIVE:   CHIEF COMPLAINT / HPI:   RLQ pain at C-section scar Patient here with friend who helps with interpretation for Arabic She reports approximately 1 month of sharp RLQ abdominal pain with lifting heavy things at work She had a C-section approximately 14 years ago, the scar is well-healed.  She wonders if the wound is dehiscing because occasionally she will see clear fluid coming from the scar on the right side She started a new job 1 month ago, this job entails a lot of heavy lifting She also has occasional midline abdominal pain with heavy lifting  Denies constipation or difficulty urinating No blood in stool No recent trauma or injury to the abdomen No other abdominal surgeries aside from C-section (specifically appendectomy, cholecystectomy, adhesion repair)  Unclear pattern around fluid coming from scar She does describe this fluid as water like, without color Never any blood Does not believe there is ever been redness, swelling, or warmth at the site  PERTINENT  PMH / PSH:  Patient Active Problem List   Diagnosis Date Noted   Right lower quadrant abdominal pain 07/16/2022   Dysuria 02/06/2022   Patellar tendonitis of both knees 09/20/2021   Healthcare maintenance 09/20/2021   Hypocalcemia 09/20/2021    OBJECTIVE:   BP 120/78   Pulse 90   Ht 5\' 3"  (1.6 m)   Wt 148 lb (67.1 kg)   LMP 06/21/2022   SpO2 99%   BMI 26.22 kg/m   PHQ-9:     07/16/2022   11:18 AM 05/31/2022    1:36 PM 04/26/2022   10:35 AM  Depression screen PHQ 2/9  Decreased Interest 0 0 0  Down, Depressed, Hopeless 0 0 0  PHQ - 2 Score 0 0 0  Altered sleeping  1 0  Tired, decreased energy  0 1  Change in appetite  0 0  Feeling bad or failure about yourself   0 0  Trouble concentrating  0 1  Moving slowly or fidgety/restless  0 0  Suicidal thoughts  0 0  PHQ-9 Score  1 2    Physical Exam General: Awake, alert, oriented, no acute distress Respiratory: Unlabored respirations,  speaking in full sentences, no respiratory distress Abd: Soft abdomen, mild localized TTP in all quadrants, C-section scar appears well-healed and minimally elevated to palpation, there is no erythema swelling or warmth around the scar, provider cannot visualize scar defect which may produce fluid, no midline or inguinal hernia palpated with Valsalva  ASSESSMENT/PLAN:   Right lower quadrant abdominal pain Subacute, 1 month duration.  No red flags to suggest hernia with incarcerated bowel.  Differential for this pain includes femoral hernia and muscle strain of the rectus abdominal muscles.  Given unremarkable physical exam, cannot definitively say if there is hernia.  At this juncture, no imaging needed urgently.  Recommend restricting lifting to no more than 15 pounds for the next 3 weeks.  Return precautions given and work note provided.  See AVS for more.     4/9, MD [facility]

## 2022-07-16 NOTE — Assessment & Plan Note (Signed)
Subacute, 1 month duration.  No red flags to suggest hernia with incarcerated bowel.  Differential for this pain includes femoral hernia and muscle strain of the rectus abdominal muscles.  Given unremarkable physical exam, cannot definitively say if there is hernia.  At this juncture, no imaging needed urgently.  Recommend restricting lifting to no more than 15 pounds for the next 3 weeks.  Return precautions given and work note provided.  See AVS for more.

## 2022-07-17 ENCOUNTER — Telehealth: Payer: Self-pay | Admitting: Family Medicine

## 2022-07-17 NOTE — Telephone Encounter (Signed)
Pt walked in to present letter written on July 16, 2022 saying her manager stated the wording in the letter needs to be changed to "No lifting greater than 7 pounds for the next three weeks."  Please call her at [phone]

## 2022-07-19 NOTE — Telephone Encounter (Signed)
Letter amended.  It should be available on patient's MyChart.  I have also routed to the administrators so they can mail it to her.  Fayette Pho, MD

## 2022-08-30 ENCOUNTER — Other Ambulatory Visit (HOSPITAL_COMMUNITY)
Admission: RE | Admit: 2022-08-30 | Discharge: 2022-08-30 | Disposition: A | Discharge status: Home / Self Care | Payer: Medicaid Other | Source: Ambulatory Visit | Attending: Family Medicine | Admitting: Family Medicine

## 2022-08-30 ENCOUNTER — Ambulatory Visit (INDEPENDENT_AMBULATORY_CARE_PROVIDER_SITE_OTHER): Payer: Self-pay | Admitting: Family Medicine

## 2022-08-30 VITALS — BP 100/60 | HR 72 | Ht 63.0 in | Wt 148.0 lb

## 2022-08-30 DIAGNOSIS — N898 Other specified noninflammatory disorders of vagina: Secondary | ICD-10-CM

## 2022-08-30 DIAGNOSIS — Z124 Encounter for screening for malignant neoplasm of cervix: Secondary | ICD-10-CM | POA: Diagnosis present

## 2022-08-30 DIAGNOSIS — R350 Frequency of micturition: Secondary | ICD-10-CM

## 2022-08-30 LAB — POCT URINALYSIS DIP (MANUAL ENTRY)
Bilirubin, UA: NEGATIVE
Glucose, UA: NEGATIVE mg/dL
Ketones, POC UA: NEGATIVE mg/dL
Nitrite, UA: NEGATIVE
Protein Ur, POC: NEGATIVE mg/dL
Spec Grav, UA: 1.02 (ref 1.010–1.025)
Urobilinogen, UA: 0.2 E.U./dL
pH, UA: 6.5 (ref 5.0–8.0)

## 2022-08-30 LAB — POCT WET PREP (WET MOUNT)
Clue Cells Wet Prep Whiff POC: NEGATIVE
Trichomonas Wet Prep HPF POC: ABSENT

## 2022-08-30 LAB — POCT UA - MICROSCOPIC ONLY

## 2022-08-30 NOTE — Progress Notes (Signed)
    SUBJECTIVE:   CHIEF COMPLAINT / HPI:   Vaginal Itching -symptoms started 1 week ago -she also notes vaginal discharge, clear to whitish -feels "hot" in her vaginal area -no odor -endorses associated urinary frequency over same time period -no dysuria or urgency -sexually active with husband only, uses patch for contraception -no fever, chills  OBJECTIVE:   BP 100/60   Pulse 72   Ht 5\' 3"  (1.6 m)   Wt 148 lb (67.1 kg)   LMP 08/12/2022   SpO2 98%   BMI 26.22 kg/m   General: NAD, able to participate in exam Respiratory: No respiratory distress Skin: warm and dry, no rashes noted Psych: Normal affect and mood Neuro: grossly intact GU/GYN: Exam performed in the presence of a chaperone. External genitalia within normal limits. Vaginal mucosa pink, moist, normal rugae.  Nonfriable cervix without lesions, no bleeding noted on speculum exam. Small amount of thick white discharge noted.  ASSESSMENT/PLAN:   Vaginal Itching, Urinary Frequency Acute x1 week. UA not indicative of UTI. Wet prep inconclusive- many bacteria and 5-10 WBCs could suggest BV. Await results of STI testing. If negative and symptoms persist, plan for course of metronidazole.  Health Maintenance Pap w/HPV cotesting done today   Alcus Dad, MD [place]

## 2022-08-30 NOTE — Patient Instructions (Addendum)
It was great to see you!  Good news- I don't see any evidence of a urinary tract infection, yeast infection, or bacterial overgrowth on the preliminary testing.  We also did your pap smear (screening for cervical cancer). This takes several days to come back. I will send you a MyChart message with the results.  Take care, Dr Rock Nephew

## 2022-09-05 LAB — CYTOLOGY - PAP
Adequacy: ABSENT
Chlamydia: NEGATIVE
Comment: NEGATIVE
Comment: NEGATIVE
Comment: NEGATIVE
Comment: NORMAL
Diagnosis: UNDETERMINED — AB
High risk HPV: NEGATIVE
Neisseria Gonorrhea: NEGATIVE
Trichomonas: NEGATIVE

## 2022-09-06 ENCOUNTER — Telehealth: Payer: Self-pay | Admitting: Family Medicine

## 2022-09-06 NOTE — Telephone Encounter (Signed)
Called patient to discuss pap results showing ASCUS with negative HPV.  ASCCP recommends repeat cotesting in 3 years.  Patient had no further questions or concerns.   Alcus Dad, MD PGY-3, [place]

## 2022-09-20 ENCOUNTER — Ambulatory Visit (INDEPENDENT_AMBULATORY_CARE_PROVIDER_SITE_OTHER): Payer: Commercial Managed Care - HMO | Admitting: Student

## 2022-09-20 VITALS — BP 102/70 | HR 93 | Temp 99.0°F | Ht 63.0 in | Wt 146.0 lb

## 2022-09-20 DIAGNOSIS — R059 Cough, unspecified: Secondary | ICD-10-CM | POA: Insufficient documentation

## 2022-09-20 DIAGNOSIS — Z23 Encounter for immunization: Secondary | ICD-10-CM

## 2022-09-20 DIAGNOSIS — N898 Other specified noninflammatory disorders of vagina: Secondary | ICD-10-CM | POA: Diagnosis not present

## 2022-09-20 HISTORY — DX: Other specified noninflammatory disorders of vagina: N89.8

## 2022-09-20 MED ORDER — PROMETHAZINE-DM 6.25-15 MG/5ML PO SYRP: 5.0000 mL | ORAL_SOLUTION | Freq: Four times a day (QID) | ORAL | 0 refills | Status: DC | PRN

## 2022-09-20 MED ORDER — METRONIDAZOLE 500 MG PO TABS: 500.0000 mg | ORAL_TABLET | Freq: Two times a day (BID) | ORAL | 0 refills | Status: AC

## 2022-09-20 NOTE — Assessment & Plan Note (Addendum)
Patient here with 2 weeks of dry cough.  Some other URI symptoms that started a couple days ago.  She is well-appearing.  Physical exam reassuring and unremarkable. Likely viral illness with or without seasonal allergies.  No focal lung findings concerning for pneumonia or other active infection.  No need for x-ray at this time. Discussed using humidifier, staying well-hydrated, honey.

## 2022-09-20 NOTE — Progress Notes (Signed)
    SUBJECTIVE:   CHIEF COMPLAINT / HPI:   Nancy Oconnor is a 36 year old female here for cough ongoing for 2 weeks.  It is a dry cough.  She has also had runny nose, sore throat for the past couple days.  Denies any nausea, vomiting, diarrhea.  Has been able to tolerate eating and drinking just fine.  She is requesting something for her cough.  She is a non-smoker, no history of asthma or COPD.  She is also having continued vaginal discharge that is white in color, with a fishy odor as well as irritation.  Recently had an office visit for this on 9/21 with a negative urinalysis for UTI, her wet prep was inconclusive with many bacteria and 5-10 white blood cells that could suggest BV.  The plan was to await results of STI testing and if negative, to treat with course of metronidazole.  PERTINENT  PMH / PSH: Reviewed  OBJECTIVE:   BP 102/70   Pulse 93   Temp 99 F (37.2 C) (Oral)   Ht 5\' 3"  (1.6 m)   Wt 146 lb (66.2 kg)   LMP 08/12/2022   SpO2 99%   BMI 25.86 kg/m   General: Well-appearing, no acute distress HEENT: Mucous membranes moist.  No oropharyngeal erythema. CV: Regular rate and rhythm Respiratory: Normal work of breathing on room air without hypoxia.  Lungs clear to auscultation in all fields. GU: Deferred Skin: No rashes Extremities: Warm, well-perfused, no edema   ASSESSMENT/PLAN:   Cough Patient here with 2 weeks of dry cough.  Some other URI symptoms that started a couple days ago.  She is well-appearing.  Physical exam reassuring and unremarkable. Likely viral illness with or without seasonal allergies.  No focal lung findings concerning for pneumonia or other active infection.  No need for x-ray at this time. Discussed using humidifier, staying well-hydrated, honey.   Vaginal irritation Given recent negative STI screening, and symptoms consistent with BV (vaginal fishy odor, discharge) will treat with metronidazole 500 mg twice daily for 7 days.     Orvis Brill,  [place]

## 2022-09-20 NOTE — Patient Instructions (Signed)
It was great seeing you today.  I sent in Metronidazole for your vaginal irritation. Take this medicine twice daily for 1 week.  You may use the cough syrup as needed every 4 hours. Please return if your cough does not improve   If you have any questions or concerns, please feel free to call the clinic.    Be well,  Dr. Orvis Brill [facility] [phone]

## 2022-09-20 NOTE — Assessment & Plan Note (Addendum)
Given recent negative STI screening, and symptoms consistent with BV (vaginal fishy odor, discharge) will treat with metronidazole 500 mg twice daily for 7 days.

## 2022-10-28 ENCOUNTER — Emergency Department (HOSPITAL_COMMUNITY): Payer: Commercial Managed Care - HMO

## 2022-10-28 ENCOUNTER — Emergency Department (HOSPITAL_COMMUNITY)
Admission: EM | Admit: 2022-10-28 | Discharge: 2022-10-29 | Disposition: A | Discharge status: Home / Self Care | Payer: Commercial Managed Care - HMO | Attending: Emergency Medicine | Admitting: Emergency Medicine

## 2022-10-28 ENCOUNTER — Other Ambulatory Visit: Payer: Self-pay

## 2022-10-28 ENCOUNTER — Ambulatory Visit (INDEPENDENT_AMBULATORY_CARE_PROVIDER_SITE_OTHER)
Admission: EM | Admit: 2022-10-28 | Discharge: 2022-10-28 | Disposition: A | Discharge status: Home / Self Care | Payer: Commercial Managed Care - HMO | Source: Home / Self Care

## 2022-10-28 ENCOUNTER — Encounter (HOSPITAL_COMMUNITY): Payer: Self-pay | Admitting: *Deleted

## 2022-10-28 DIAGNOSIS — K529 Noninfective gastroenteritis and colitis, unspecified: Secondary | ICD-10-CM | POA: Insufficient documentation

## 2022-10-28 DIAGNOSIS — R112 Nausea with vomiting, unspecified: Secondary | ICD-10-CM | POA: Insufficient documentation

## 2022-10-28 DIAGNOSIS — R1084 Generalized abdominal pain: Secondary | ICD-10-CM | POA: Insufficient documentation

## 2022-10-28 DIAGNOSIS — N9489 Other specified conditions associated with female genital organs and menstrual cycle: Secondary | ICD-10-CM | POA: Diagnosis not present

## 2022-10-28 DIAGNOSIS — R1013 Epigastric pain: Secondary | ICD-10-CM | POA: Insufficient documentation

## 2022-10-28 LAB — POC URINE PREG, ED: Preg Test, Ur: NEGATIVE

## 2022-10-28 LAB — CBC
HCT: 39.7 % (ref 36.0–46.0)
Hemoglobin: 12.9 g/dL (ref 12.0–15.0)
MCH: 28.6 pg (ref 26.0–34.0)
MCHC: 32.5 g/dL (ref 30.0–36.0)
MCV: 88 fL (ref 80.0–100.0)
Platelets: 226 10*3/uL (ref 150–400)
RBC: 4.51 MIL/uL (ref 3.87–5.11)
RDW: 13.5 % (ref 11.5–15.5)
WBC: 11.9 10*3/uL — ABNORMAL HIGH (ref 4.0–10.5)
nRBC: 0 % (ref 0.0–0.2)

## 2022-10-28 LAB — POCT URINALYSIS DIPSTICK, ED / UC
Bilirubin Urine: NEGATIVE
Glucose, UA: NEGATIVE mg/dL
Ketones, ur: NEGATIVE mg/dL
Nitrite: NEGATIVE
Protein, ur: NEGATIVE mg/dL
Specific Gravity, Urine: 1.02 (ref 1.005–1.030)
Urobilinogen, UA: 0.2 mg/dL (ref 0.0–1.0)
pH: 7 (ref 5.0–8.0)

## 2022-10-28 LAB — BASIC METABOLIC PANEL
Anion gap: 8 (ref 5–15)
BUN: 9 mg/dL (ref 6–20)
CO2: 25 mmol/L (ref 22–32)
Calcium: 8.8 mg/dL — ABNORMAL LOW (ref 8.9–10.3)
Chloride: 105 mmol/L (ref 98–111)
Creatinine, Ser: 0.67 mg/dL (ref 0.44–1.00)
GFR, Estimated: >60 mL/min (ref >60)
Glucose, Bld: 105 mg/dL — ABNORMAL HIGH (ref 70–99)
Potassium: 3.6 mmol/L (ref 3.5–5.1)
Sodium: 138 mmol/L (ref 135–145)

## 2022-10-28 LAB — LIPASE, BLOOD: Lipase: 40 U/L (ref 11–51)

## 2022-10-28 LAB — I-STAT BETA HCG BLOOD, ED (MC, WL, AP ONLY): I-stat hCG, quantitative: <5 m[IU]/mL (ref <5)

## 2022-10-28 LAB — TROPONIN I (HIGH SENSITIVITY): Troponin I (High Sensitivity): 3 ng/L (ref <18)

## 2022-10-28 MED ORDER — ALUM & MAG HYDROXIDE-SIMETH 200-200-20 MG/5ML PO SUSP
30.0000 mL | Freq: Once | ORAL | Status: AC
  Administered 2022-10-28: 30 mL via ORAL

## 2022-10-28 MED ORDER — ONDANSETRON HCL 4 MG/2ML IJ SOLN
4.0000 mg | Freq: Once | INTRAMUSCULAR | Status: AC
  Administered 2022-10-28: 4 mg via INTRAVENOUS
  Filled 2022-10-28: qty 2

## 2022-10-28 MED ORDER — ONDANSETRON HCL 4 MG/2ML IJ SOLN: 4.0000 mg | Freq: Once | INTRAMUSCULAR | Status: DC | PRN

## 2022-10-28 MED ORDER — LIDOCAINE VISCOUS HCL 2 % MT SOLN
15.0000 mL | Freq: Once | OROMUCOSAL | Status: AC
  Administered 2022-10-28: 15 mL via OROMUCOSAL

## 2022-10-28 MED ORDER — ONDANSETRON 4 MG PO TBDP
ORAL_TABLET | ORAL | Status: AC
  Filled 2022-10-28: qty 1

## 2022-10-28 MED ORDER — IOHEXOL 350 MG/ML SOLN
75.0000 mL | Freq: Once | INTRAVENOUS | Status: AC | PRN
  Administered 2022-10-28: 75 mL via INTRAVENOUS

## 2022-10-28 MED ORDER — SODIUM CHLORIDE 0.9 % IV BOLUS
1000.0000 mL | Freq: Once | INTRAVENOUS | Status: AC
  Administered 2022-10-28: 1000 mL via INTRAVENOUS

## 2022-10-28 MED ORDER — ONDANSETRON 4 MG PO TBDP
4.0000 mg | ORAL_TABLET | Freq: Once | ORAL | Status: AC
  Administered 2022-10-28: 4 mg via ORAL

## 2022-10-28 MED ORDER — ALUM & MAG HYDROXIDE-SIMETH 200-200-20 MG/5ML PO SUSP
ORAL | Status: AC
  Filled 2022-10-28: qty 30

## 2022-10-28 MED ORDER — ONDANSETRON HCL 4 MG PO TABS: 4.0000 mg | ORAL_TABLET | Freq: Four times a day (QID) | ORAL | 0 refills | Status: DC

## 2022-10-28 MED ORDER — LIDOCAINE VISCOUS HCL 2 % MT SOLN
OROMUCOSAL | Status: AC
  Filled 2022-10-28: qty 15

## 2022-10-28 MED ORDER — MORPHINE SULFATE (PF) 4 MG/ML IV SOLN
4.0000 mg | Freq: Once | INTRAVENOUS | Status: AC
  Administered 2022-10-28: 4 mg via INTRAVENOUS
  Filled 2022-10-28: qty 1

## 2022-10-28 NOTE — Discharge Instructions (Addendum)
You can take the nausea medicine (Zofran) every 6 hours as needed to settle the stomach.  Please increase your water intake is much as tolerated.  If at any point your symptoms become severe please go directly to the emergency department.

## 2022-10-28 NOTE — ED Provider Notes (Signed)
[facility] EMERGENCY DEPARTMENT Provider Note   CSN: [id number] Arrival date & time: 10/28/22  1824     History  Chief Complaint  Patient presents with   Chest Pain   Abdominal Pain    Nancy Oconnor is a 36 y.o. female.  The history is provided by the patient and medical records. No language interpreter was used.  Chest Pain Pain location:  Substernal area and epigastric Pain quality: aching and sharp   Pain severity:  Severe Onset quality:  Gradual Duration:  4 days Timing:  Constant Progression:  Waxing and waning Chronicity:  Recurrent Associated symptoms: abdominal pain, back pain (r flank), nausea and vomiting   Associated symptoms: no altered mental status, no cough, no fatigue, no fever, no headache, no lower extremity edema, no palpitations and no shortness of breath   Abdominal pain:    Location:  Epigastric, periumbilical and RUQ   Quality: aching, cramping, dull and stabbing     Severity:  Severe   Onset quality:  Gradual   Duration:  4 days   Timing:  Constant   Progression:  Waxing and waning   Chronicity:  Recurrent Abdominal Pain Associated symptoms: chest pain, diarrhea, nausea and vomiting   Associated symptoms: no chills, no constipation, no cough, no dysuria, no fatigue, no fever and no shortness of breath        Home Medications Prior to Admission medications   Medication Sig Start Date End Date Taking? Authorizing Provider  norelgestromin-ethinyl estradiol Burr Medico) 150-35 MCG/24HR transdermal patch Place 1 patch onto the skin once a week. 04/19/22 04/19/23  Brimage, Seward Meth, DO  ondansetron (ZOFRAN) 4 MG tablet Take 1 tablet (4 mg total) by mouth every 6 (six) hours. 10/28/22   Rising, Lurena Joiner, PA-C  promethazine-dextromethorphan (PROMETHAZINE-DM) 6.25-15 MG/5ML syrup Take 5 mLs by mouth 4 (four) times daily as needed for cough. 09/20/22   Dameron, Nolberto Hanlon, DO      Allergies    Other    Review of Systems   Review of  Systems  Constitutional:  Negative for chills, fatigue and fever.  HENT:  Negative for congestion.   Respiratory:  Negative for cough, chest tightness, shortness of breath and wheezing.   Cardiovascular:  Positive for chest pain. Negative for palpitations.  Gastrointestinal:  Positive for abdominal pain, diarrhea, nausea and vomiting. Negative for constipation.  Genitourinary:  Negative for dysuria, flank pain and frequency.  Musculoskeletal:  Positive for back pain (r flank).  Skin:  Negative for rash and wound.  Neurological:  Negative for light-headedness and headaches.  Psychiatric/Behavioral:  Negative for agitation.   All other systems reviewed and are negative.   Physical Exam Updated Vital Signs BP (!) 132/92 (BP Location: Right Arm)   Pulse 67   Temp 97.9 F (36.6 C) (Oral)   Resp 14   Ht 5\' 1"  (1.549 m)   Wt 63.5 kg   LMP 10/03/2022   SpO2 100%   BMI 26.45 kg/m  Physical Exam Vitals and nursing note reviewed.  Constitutional:      General: She is not in acute distress.    Appearance: She is well-developed. She is ill-appearing. She is not toxic-appearing or diaphoretic.  HENT:     Head: Normocephalic and atraumatic.     Mouth/Throat:     Mouth: Mucous membranes are moist.  Eyes:     Extraocular Movements: Extraocular movements intact.     Conjunctiva/sclera: Conjunctivae normal.  Cardiovascular:     Rate and Rhythm: Normal rate and  regular rhythm.     Heart sounds: No murmur heard. Pulmonary:     Effort: Pulmonary effort is normal. No respiratory distress.     Breath sounds: Normal breath sounds. No wheezing, rhonchi or rales.  Chest:     Chest wall: Tenderness present.  Abdominal:     General: Abdomen is flat. Bowel sounds are normal. There is no distension.     Palpations: Abdomen is soft.     Tenderness: There is abdominal tenderness in the right upper quadrant and epigastric area.  Musculoskeletal:        General: No swelling.     Cervical back: Neck  supple.  Skin:    General: Skin is warm and dry.     Capillary Refill: Capillary refill takes less than 2 seconds.     Coloration: Skin is not pale.     Findings: No rash.  Neurological:     Mental Status: She is alert.  Psychiatric:        Mood and Affect: Mood normal.     ED Results / Procedures / Treatments   Labs (all labs ordered are listed, but only abnormal results are displayed) Labs Reviewed  BASIC METABOLIC PANEL - Abnormal; Notable for the following components:      Result Value   Glucose, Bld 105 (*)    Calcium 8.8 (*)    All other components within normal limits  CBC - Abnormal; Notable for the following components:   WBC 11.9 (*)    All other components within normal limits  LIPASE, BLOOD  URINALYSIS, ROUTINE W REFLEX MICROSCOPIC  HEPATIC FUNCTION PANEL  I-STAT BETA HCG BLOOD, ED ([facility], [facility], [facility] ONLY)  TROPONIN I (HIGH SENSITIVITY)  TROPONIN I (HIGH SENSITIVITY)    EKG EKG Interpretation  Date/Time:  Sunday October 28 2022 18:52:00 EST Ventricular Rate:  64 PR Interval:  164 QRS Duration: 107 QT Interval:  385 QTC Calculation: 398 R Axis:   37 Text Interpretation: Sinus rhythm RSR' in V1 or V2, right VCD or RVH Borderline T abnormalities, anterior leads when compared to prior, similar appeanrce. NO STEMI Confirmed by Theda Belfast ([id number]) on 10/28/2022 6:59:30 PM  Radiology US Abdomen Limited RUQ (LIVER/GB)  Result Date: 10/28/2022 CLINICAL DATA:  [id number] with right upper quadrant abdominal pain. EXAM: ULTRASOUND ABDOMEN LIMITED RIGHT UPPER QUADRANT COMPARISON:  CT with IV contrast earlier today. FINDINGS: Gallbladder: No gallstones or wall thickening visualized. No sonographic Murphy sign noted by sonographer. Common bile duct: Diameter: 3 mm, with no intrahepatic biliary prominence. Liver: No focal lesion identified. The liver parenchyma is mildly echogenic consistent with the mild fatty replacement noted on the CT. Portal vein is patent on color Doppler  imaging with normal direction of blood flow towards the liver. Other: None. IMPRESSION: 1. No evidence of cholelithiasis, gallbladder thickening or biliary ductal dilatation. 2. Mild fatty replacement of the liver. Electronically Signed   By: Almira Bar M.D.   On: 10/28/2022 21:41   CT ABDOMEN PELVIS W CONTRAST  Result Date: 10/28/2022 CLINICAL DATA:  Acute epigastric and abdominal pain for 3 days. EXAM: CT ABDOMEN AND PELVIS WITH CONTRAST TECHNIQUE: Multidetector CT imaging of the abdomen and pelvis was performed using the standard protocol following bolus administration of intravenous contrast. RADIATION DOSE REDUCTION: This exam was performed according to the departmental dose-optimization program which includes automated exposure control, adjustment of the mA and/or kV according to patient size and/or use of iterative reconstruction technique. CONTRAST:  41mL OMNIPAQUE IOHEXOL 350 MG/ML SOLN COMPARISON:  Noncontrast CT on 12/09/2017 FINDINGS: Lower Chest: No acute findings. Hepatobiliary: No hepatic masses identified. Gallbladder is unremarkable. No evidence of biliary ductal dilatation. Pancreas:  No mass or inflammatory changes. Spleen: Within normal limits in size and appearance. Adrenals/Urinary Tract: No suspicious masses identified. No evidence of ureteral calculi or hydronephrosis. Stomach/Bowel: No evidence of obstruction, inflammatory process or abnormal fluid collections. Normal appendix visualized. Vascular/Lymphatic: No pathologically enlarged lymph nodes. No acute vascular findings. Reproductive:  No mass or other significant abnormality. Other:  None. Musculoskeletal:  No suspicious bone lesions identified. IMPRESSION: Negative. No acute findings or other significant abnormality. Electronically Signed   By: Danae OrleansJohn A Stahl M.D.   On: 10/28/2022 20:48   CT Angio Chest PE W and/or Wo Contrast  Result Date: 10/28/2022 CLINICAL DATA:  Chest and back pain. High probability for pulmonary  embolism. EXAM: CT ANGIOGRAPHY CHEST WITH CONTRAST TECHNIQUE: Multidetector CT imaging of the chest was performed using the standard protocol during bolus administration of intravenous contrast. Multiplanar CT image reconstructions and MIPs were obtained to evaluate the vascular anatomy. RADIATION DOSE REDUCTION: This exam was performed according to the departmental dose-optimization program which includes automated exposure control, adjustment of the mA and/or kV according to patient size and/or use of iterative reconstruction technique. CONTRAST:  75mL OMNIPAQUE IOHEXOL 350 MG/ML SOLN COMPARISON:  None Available. FINDINGS: Cardiovascular: Satisfactory opacification of pulmonary arteries noted, and no pulmonary emboli identified. No evidence of thoracic aortic dissection or aneurysm. Mediastinum/Nodes: No masses or pathologically enlarged lymph nodes identified. Lungs/Pleura: No pulmonary mass, infiltrate, or effusion. Upper abdomen: No acute findings. Musculoskeletal: No suspicious bone lesions identified. Review of the MIP images confirms the above findings. IMPRESSION: Negative. No evidence of pulmonary embolism or other active disease. Electronically Signed   By: Danae OrleansJohn A Stahl M.D.   On: 10/28/2022 20:44   DG Chest 2 View  Result Date: 10/28/2022 CLINICAL DATA:  Chest pain EXAM: CHEST - 2 VIEW COMPARISON:  Abdominal series 08/29/2016 FINDINGS: The heart size and mediastinal contours are within normal limits. Both lungs are clear. The visualized skeletal structures are unremarkable. IMPRESSION: No active cardiopulmonary disease. Electronically Signed   By: Darliss CheneyAmy  Guttmann M.D.   On: 10/28/2022 19:19    Procedures Procedures    Medications Ordered in ED Medications  ondansetron (ZOFRAN) injection 4 mg (has no administration in time range)  morphine (PF) 4 MG/ML injection 4 mg (has no administration in time range)  morphine (PF) 4 MG/ML injection 4 mg (4 mg Intravenous Given 10/28/22 2001)   ondansetron (ZOFRAN) injection 4 mg (4 mg Intravenous Given 10/28/22 2001)  sodium chloride 0.9 % bolus 1,000 mL (0 mLs Intravenous Stopped 10/28/22 2300)  iohexol (OMNIPAQUE) 350 MG/ML injection 75 mL (75 mLs Intravenous Contrast Given 10/28/22 2023)    ED Course/ Medical Decision Making/ A&P                           Medical Decision Making Amount and/or Complexity of Data Reviewed Labs: ordered. Radiology: ordered.  Risk Prescription drug management.    Maryfrances Bunnellada Gut is a 36 y.o. female with a past medical history significant for previous PID, previous laparoscopy and lysis of adhesions back in 2017 who presents from an urgent care for further evaluation and work-up of upper abdominal pain, chest pain, nausea, and vomiting.  According to patient, for the last 4 days she has had worsening pain in her abdomen.  She reports is primarily epigastric and right upper quadrant but also  goes to her mid abdomen.  She reports it also goes to her chest and she is having somewhat pleuritic discomfort.  Does appear she is on blood thinners.  She reports nausea and vomiting and has not been able to eat or drink much.  She denies any constipation but has had some looser stools.  She reports she has had previous vaginal discharge and was on medications for it and reports it has improved.  She denies any pelvic pain or vaginal bleeding at this time.  She denies fevers.  Denies any trauma.  Denies mid back pain but reports the pain does radiate around from her right upper quadrant around to her right back.  Denies urinary changes.  Denies any rashes to suggest shingles.  She said she had this pain mildly in the past but never like this.  She is unsure if she has a history of gallstones.  On exam, lungs are clear and chest is slightly tender.  No murmur.  Patient is quite tender in the abdomen worse in the epigastric and right upper quadrant area.  The pain and tenderness do wrap around her right flank but  again there is no rash seen.  Back was nontender otherwise.  Normal bowel sounds.  Given the 10 out of 10 pain in her location discomfort I am primarily concerned about acute cholecystitis however given her history of previous surgery the nausea the vomiting and the pain in the central abdomen as well, I will order CT imaging of the abdomen pelvis as well as the ultrasound of the gallbladder.  However given the pleuritic nature of her discomfort, the chest pain and being on blood thinners with the 10 out of 10 discomfort, I will also order a CT PE study.  Her chest x-ray did not show acute pneumonia or pneumothorax and her EKG appeared normal.  She will get troponin other labs.  We will give her some pain and nausea medicine and keep her n.p.o.  Anticipate reassessment after work-up to determine disposition.  10:47 PM Patient's work-up continues to return.CT imaging was overall reassuring.  CT on pelvis did not show acute abnormality and CT PE study was negative for PE. Ultrasound showed a mildly fatty liver but otherwise no evidence of acute cholecystitis.  Patient does have mild leukocytosis and metabolic panel showed normal kidney function.  She is still waiting on hepatic function, delta troponin, and urinalysis.  If work-up is reassuring, suspect this may be a gastritis or ulcer.  Care transferred to oncoming team to await for results of urinalysis and reassessment.         Final Clinical Impression(s) / ED Diagnoses Final diagnoses:  Epigastric pain  Nausea and vomiting, unspecified vomiting type      Clinical Impression: 1. Epigastric pain   2. Nausea and vomiting, unspecified vomiting type     Disposition: Care transferred to oncoming team to await for results of urinalysis and reassessment.  This note was prepared with assistance of Conservation officer, historic buildings. Occasional wrong-word or sound-a-like substitutions may have occurred due to the inherent limitations of  voice recognition software.      Othella Slappey, Canary Brim, MD 10/28/22 [phone]

## 2022-10-28 NOTE — ED Triage Notes (Signed)
PT has abd pain with diarrhea and vomiting.for 4 days.

## 2022-10-28 NOTE — ED Provider Notes (Signed)
Care assumed from Dr. Rush Landmark, patient with epigastric pain radiating to the back, negative imaging. Currently waiting for urinalysis. Plan is for discharge on a proton pump inhibitor.  I have reviewed and interpreted the urinalysis, and my interpretation is normal urinalysis.  I have ordered a dose of an antiacid and pantoprazole.   Dione Booze, MD 10/29/22 [phone]

## 2022-10-28 NOTE — ED Notes (Signed)
Patient transported to Ultrasound 

## 2022-10-28 NOTE — ED Triage Notes (Signed)
Pt c/o of cp, back pain, and epigastric pain for 3 days. Pain increased and brought pt to ED. Pt appears to be in a lot of pain and teary eyed in treatment room. Pt seen at [facility] for same and told to come to ER, no medical hx

## 2022-10-28 NOTE — ED Provider Notes (Signed)
[facility]    CSN: [id number] Arrival date & time: 10/28/22  1556      History   Chief Complaint Chief Complaint  Patient presents with   Abdominal Pain    HPI Nancy Oconnor is a 36 y.o. female.  Presents with 4-day history of abdominal pain, nausea, diarrhea Mostly feels epigastric pain Diarrhea all day yesterday. No blood  Nausea sensation causing decreased appetite.  Able to tolerate fluids Denies vomiting.  No fevers. Denies urinary symptoms No sick contacts.  Denies vaginal discharge or pelvic pain History of PID  Past Medical History:  Diagnosis Date   Chest pain    limited exertion for past 4 days   Chills    Complicated UTI (urinary tract infection) 03/10/2015   Dry cough    2 years   Dyspnea    Dysuria 02/06/2022   Headache    Healthcare maintenance 09/20/2021   Hypocalcemia 09/20/2021   Noted on previous labs from 2018.    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/06/2022   Vaginal candidiasis 02/06/2022    Patient Active Problem List   Diagnosis Date Noted   Cough 09/20/2022   Vaginal irritation 09/20/2022   Right lower quadrant abdominal pain 07/16/2022   Patellar tendonitis of both knees 09/20/2021    Past Surgical History:  Procedure Laterality Date   CESAREAN SECTION     IUD REMOVAL N/A 10/08/2016   Procedure: INTRAUTERINE DEVICE (IUD) REMOVAL;  Surgeon: Edwinna Areola, DO;  Location: [facility];  Service: Gynecology;  Laterality: N/A;   LAPAROSCOPY N/A 10/08/2016   Procedure: LAPAROSCOPY DIAGNOSTIC;  Surgeon: Edwinna Areola, DO;  Location: [facility];  Service: Gynecology;  Laterality: N/A;   LYSIS OF ADHESION N/A 10/08/2016   Procedure: LYSIS OF ADHESION;  Surgeon: Edwinna Areola, DO;  Location: [facility];  Service: Gynecology;  Laterality: N/A;    OB History      Gravida  2   Para  2   Term  2   Preterm      AB      Living         SAB      IAB      Ectopic      Multiple      Live Births  2            Home Medications    Prior to Admission medications   Medication Sig Start Date End Date Taking? Authorizing Provider  ondansetron (ZOFRAN) 4 MG tablet Take 1 tablet (4 mg total) by mouth every 6 (six) hours. 10/28/22  Yes Jacorion Klem, Lurena Joiner, PA-C  norelgestromin-ethinyl estradiol Burr Medico) 150-35 MCG/24HR transdermal patch Place 1 patch onto the skin once a week. 04/19/22 04/19/23  Katha Cabal, DO  promethazine-dextromethorphan (PROMETHAZINE-DM) 6.25-15 MG/5ML syrup Take 5 mLs by mouth 4 (four) times daily as needed for cough. 09/20/22   Darral Dash, DO    Family History Family History  Problem Relation Age of Onset   Hypertension Mother    Hypertension Father     Social History Social History   Tobacco Use   Smoking status: Never   Smokeless tobacco: Never  Substance Use Topics   Alcohol use: No   Drug use: No     Allergies   Other   Review of Systems Review  of Systems  Gastrointestinal:  Positive for abdominal pain.   Per HPI  Physical Exam Triage Vital Signs ED Triage Vitals  Enc Vitals Group     BP 10/28/22 1659 109/75     Pulse Rate 10/28/22 1659 76     Resp 10/28/22 1659 18     Temp 10/28/22 1659 97.7 F (36.5 C)     Temp src --      SpO2 10/28/22 1659 98 %     Weight --      Height --      Head Circumference --      Peak Flow --      Pain Score 10/28/22 1656 8     Pain Loc --      Pain Edu? --      Excl. in GC? --    No data found.  Updated Vital Signs BP 109/75   Pulse 76   Temp 97.7 F (36.5 C)   Resp 18   LMP 10/03/2022   SpO2 98%   Visual Acuity Right Eye Distance:   Left Eye Distance:   Bilateral Distance:    Right Eye Near:   Left Eye Near:    Bilateral Near:     Physical Exam Vitals and nursing note reviewed.  Constitutional:      Appearance:  Normal appearance.  HENT:     Mouth/Throat:     Mouth: Mucous membranes are moist.     Pharynx: Oropharynx is clear.  Eyes:     Conjunctiva/sclera: Conjunctivae normal.  Cardiovascular:     Rate and Rhythm: Normal rate and regular rhythm.     Heart sounds: Normal heart sounds.  Pulmonary:     Effort: Pulmonary effort is normal. No respiratory distress.     Breath sounds: Normal breath sounds.  Abdominal:     General: Bowel sounds are normal.     Palpations: Abdomen is soft.     Tenderness: There is abdominal tenderness in the epigastric area. There is no right CVA tenderness, left CVA tenderness, guarding or rebound.  Musculoskeletal:        General: Normal range of motion.  Skin:    General: Skin is warm and dry.  Neurological:     Mental Status: She is alert and oriented to person, place, and time.      UC Treatments / Results  Labs (all labs ordered are listed, but only abnormal results are displayed) Labs Reviewed  POCT URINALYSIS DIPSTICK, ED / UC - Abnormal; Notable for the following components:      Result Value   Hgb urine dipstick TRACE (*)    Leukocytes,Ua TRACE (*)    All other components within normal limits  URINE CULTURE  POC URINE PREG, ED    EKG   Radiology No results found.  Procedures Procedures  Medications Ordered in UC Medications  alum & mag hydroxide-simeth (MAALOX/MYLANTA) 200-200-20 MG/5ML suspension 30 mL (30 mLs Oral Given 10/28/22 1752)  lidocaine (XYLOCAINE) 2 % viscous mouth solution 15 mL (15 mLs Mouth/Throat Given 10/28/22 1752)  ondansetron (ZOFRAN-ODT) disintegrating tablet 4 mg (4 mg Oral Given 10/28/22 1752)    Initial Impression / Assessment and Plan / UC Course  I have reviewed the triage vital signs and the nursing notes.  Pertinent labs & imaging results that were available during my care of the patient were reviewed by me and considered in my medical decision making (see chart for details).  Trace hgb and leuks,  will culture. No  urinary symptoms at this time. Urine pregnancy negative. Denies vaginal symptoms and will defer swab today.  Abd pain improved with GI cocktail and zofran No red flags today. Recommend monitor symptoms, increase fluids as tolerated, bland diet, zofran q6 hours as needed Discussed going to ED if symptoms worsen including severe pain, intractable vomiting, inability to tolerate fluids by mouth Patient is agreeable to plan  Final Clinical Impressions(s) / UC Diagnoses   Final diagnoses:  Generalized abdominal pain  Gastroenteritis     Discharge Instructions      You can take the nausea medicine (Zofran) every 6 hours as needed to settle the stomach.  Please increase your water intake is much as tolerated.  If at any point your symptoms become severe please go directly to the emergency department.    ED Prescriptions     Medication Sig Dispense Auth. Provider   ondansetron (ZOFRAN) 4 MG tablet Take 1 tablet (4 mg total) by mouth every 6 (six) hours. 12 tablet Lavanda Nevels, Lurena Joiner, PA-C      PDMP not reviewed this encounter.   Kathrine Haddock 10/28/22 1835

## 2022-10-29 LAB — HEPATIC FUNCTION PANEL
ALT: 11 U/L (ref 0–44)
AST: 17 U/L (ref 15–41)
Albumin: 3.4 g/dL — ABNORMAL LOW (ref 3.5–5.0)
Alkaline Phosphatase: 54 U/L (ref 38–126)
Bilirubin, Direct: <0.1 mg/dL (ref 0.0–0.2)
Total Bilirubin: 0.4 mg/dL (ref 0.3–1.2)
Total Protein: 6.2 g/dL — ABNORMAL LOW (ref 6.5–8.1)

## 2022-10-29 LAB — URINALYSIS, ROUTINE W REFLEX MICROSCOPIC
Bilirubin Urine: NEGATIVE
Glucose, UA: NEGATIVE mg/dL
Hgb urine dipstick: NEGATIVE
Ketones, ur: NEGATIVE mg/dL
Leukocytes,Ua: NEGATIVE
Nitrite: NEGATIVE
Protein, ur: NEGATIVE mg/dL
Specific Gravity, Urine: 1.017 (ref 1.005–1.030)
pH: 7 (ref 5.0–8.0)

## 2022-10-29 LAB — TROPONIN I (HIGH SENSITIVITY): Troponin I (High Sensitivity): 3 ng/L (ref <18)

## 2022-10-29 LAB — URINE CULTURE: Culture: NO GROWTH

## 2022-10-29 MED ORDER — ALUM & MAG HYDROXIDE-SIMETH 200-200-20 MG/5ML PO SUSP
30.0000 mL | Freq: Once | ORAL | Status: AC
  Administered 2022-10-29: 30 mL via ORAL
  Filled 2022-10-29: qty 30

## 2022-10-29 MED ORDER — PANTOPRAZOLE SODIUM 40 MG PO TBEC: 40.0000 mg | DELAYED_RELEASE_TABLET | Freq: Every day | ORAL | 0 refills | Status: DC

## 2022-10-29 MED ORDER — ONDANSETRON 8 MG PO TBDP: 8.0000 mg | ORAL_TABLET | Freq: Three times a day (TID) | ORAL | 0 refills | Status: DC | PRN

## 2022-10-29 MED ORDER — PANTOPRAZOLE SODIUM 40 MG PO TBEC
40.0000 mg | DELAYED_RELEASE_TABLET | Freq: Once | ORAL | Status: AC
  Administered 2022-10-29: 40 mg via ORAL
  Filled 2022-10-29: qty 1

## 2022-10-29 NOTE — Discharge Instructions (Signed)
You may use antacids such as Maalox, Mylanta, Pepto-Bismol as needed.  Return to the emergency department if symptoms or not being adequately controlled at home.

## 2022-10-29 NOTE — ED Notes (Signed)
Patient verbalizes understanding of d/c instructions. Opportunities for questions and answers were provided. Pt d/c from ED and ambulated to lobby with significant other. °

## 2023-01-22 IMAGING — DX DG CHEST 2V
2 series · 2 of 2 positions shown · non-contrast
Comparison: None.

CLINICAL DATA: Cough and shortness of breath

EXAM:
CHEST - 2 VIEW

[chest pa]
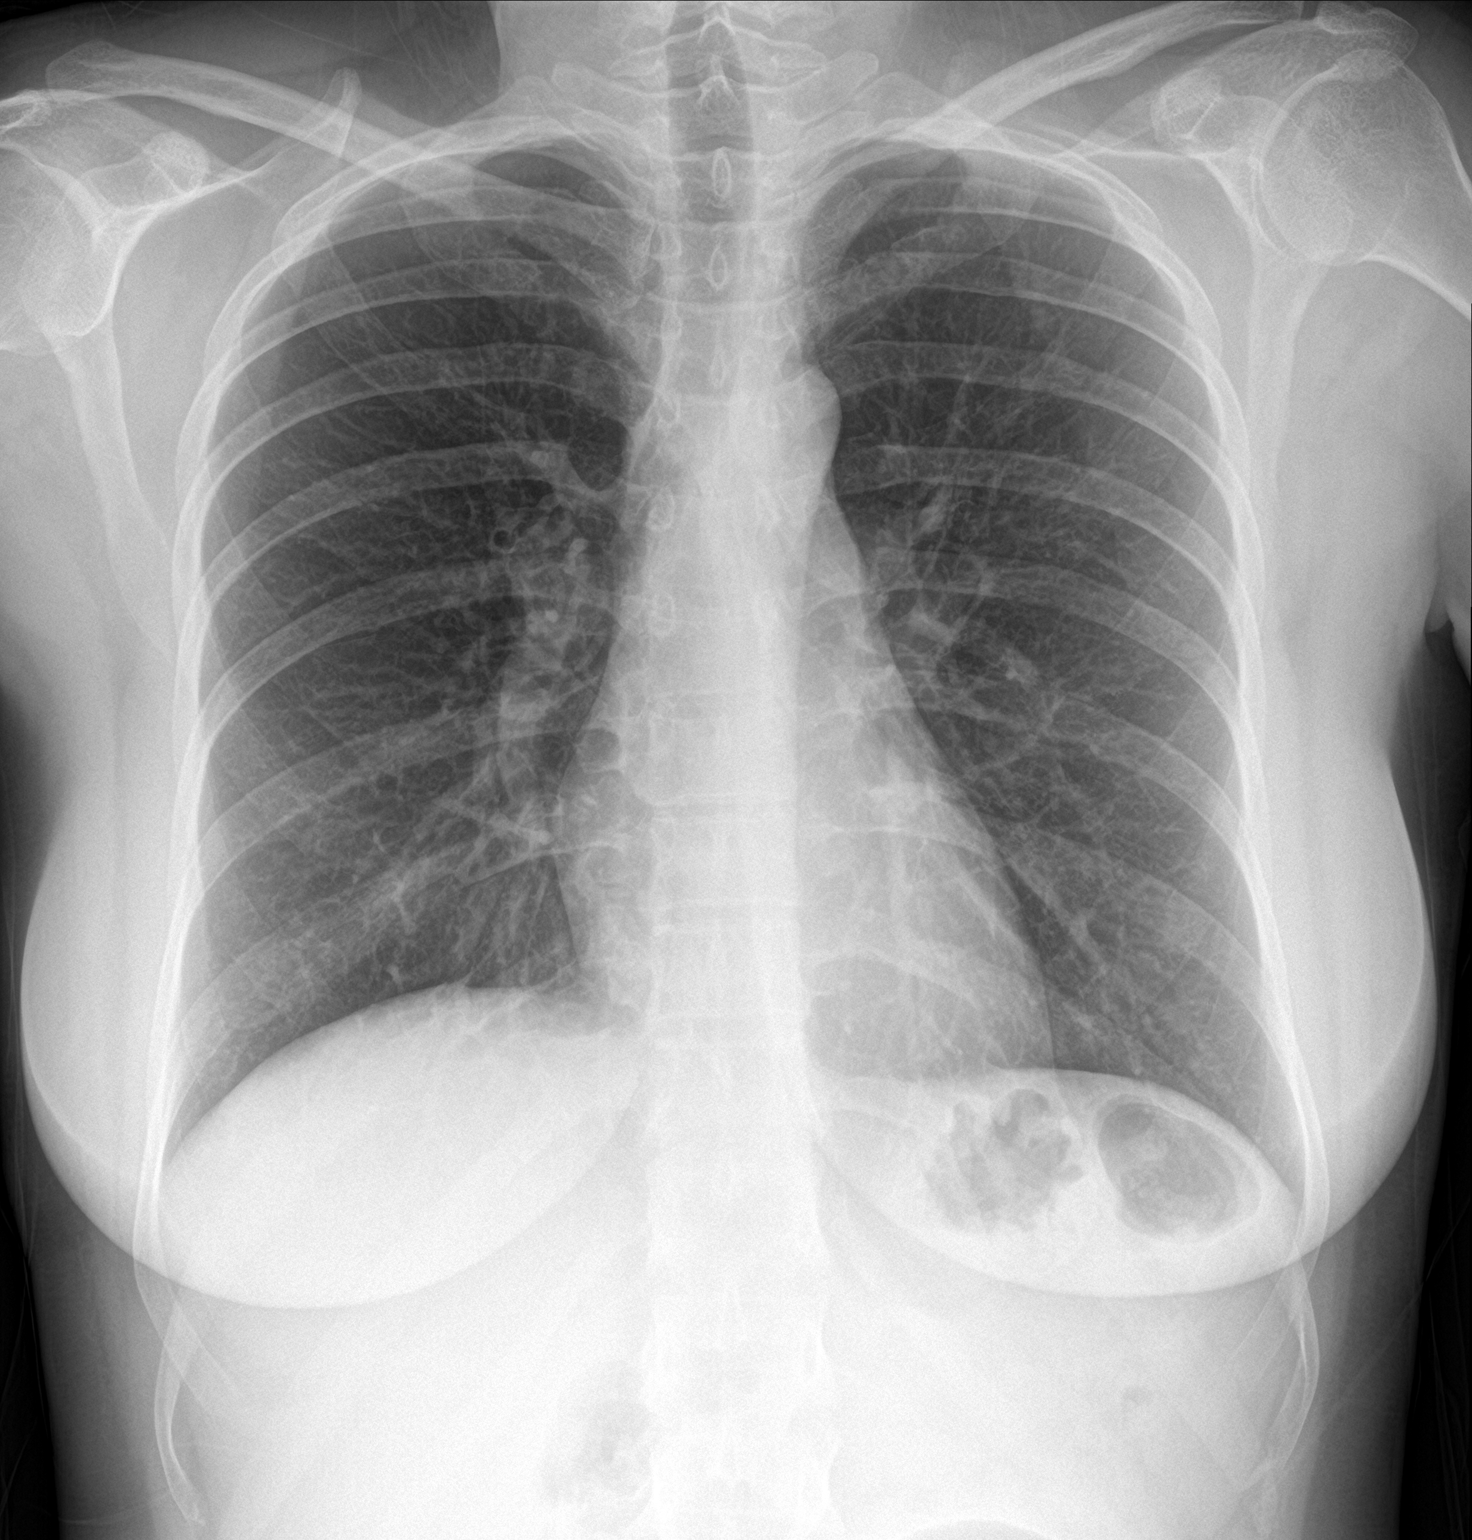

[chest lat]
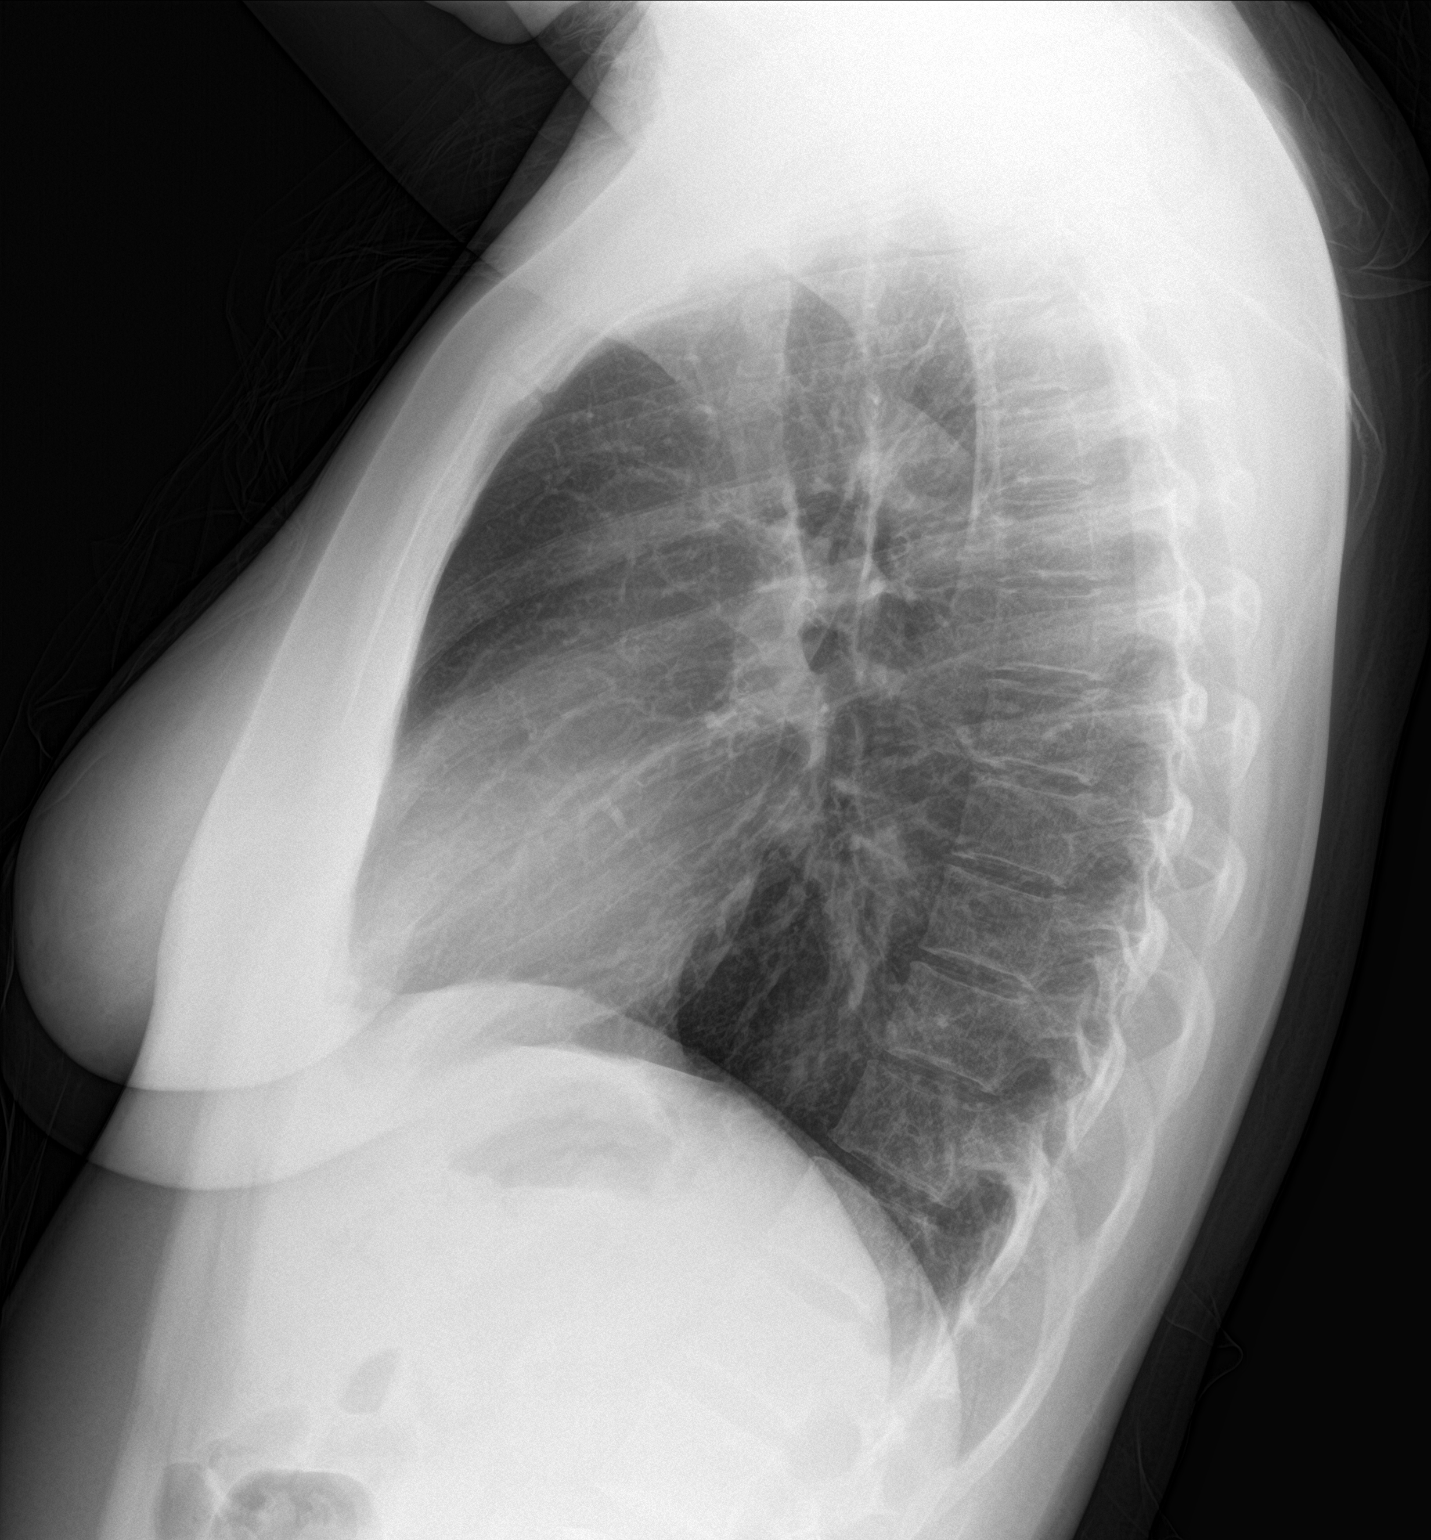

[2 of 2 positions shown; findings below may reference images not displayed]

FINDINGS: The heart size and mediastinal contours are within normal limits.
Both lungs are clear. The visualized skeletal structures are
unremarkable.
IMPRESSION: No active cardiopulmonary disease.

## 2023-02-04 ENCOUNTER — Encounter: Payer: Self-pay | Admitting: Student

## 2023-02-04 ENCOUNTER — Ambulatory Visit: Payer: Medicaid Other | Admitting: Student

## 2023-02-04 VITALS — BP 100/70 | HR 66 | Temp 98.3°F | Ht 63.0 in | Wt 150.2 lb

## 2023-02-04 DIAGNOSIS — N898 Other specified noninflammatory disorders of vagina: Secondary | ICD-10-CM

## 2023-02-04 DIAGNOSIS — R300 Dysuria: Secondary | ICD-10-CM

## 2023-02-04 DIAGNOSIS — Z131 Encounter for screening for diabetes mellitus: Secondary | ICD-10-CM | POA: Diagnosis present

## 2023-02-04 DIAGNOSIS — R1013 Epigastric pain: Secondary | ICD-10-CM

## 2023-02-04 LAB — POCT URINALYSIS DIP (MANUAL ENTRY)
Bilirubin, UA: NEGATIVE
Glucose, UA: NEGATIVE mg/dL
Ketones, POC UA: NEGATIVE mg/dL
Leukocytes, UA: NEGATIVE
Nitrite, UA: NEGATIVE
Protein Ur, POC: NEGATIVE mg/dL
Spec Grav, UA: 1.025 (ref 1.010–1.025)
Urobilinogen, UA: 0.2 E.U./dL
pH, UA: 6 (ref 5.0–8.0)

## 2023-02-04 LAB — POCT UA - MICROSCOPIC ONLY: WBC, Ur, HPF, POC: NONE SEEN (ref 0–5)

## 2023-02-04 LAB — POCT URINE PREGNANCY: Preg Test, Ur: NEGATIVE

## 2023-02-04 LAB — POCT GLYCOSYLATED HEMOGLOBIN (HGB A1C): Hemoglobin A1C: 5.3 % (ref 4.0–5.6)

## 2023-02-04 MED ORDER — FLUCONAZOLE 150 MG PO TABS: 150.0000 mg | ORAL_TABLET | Freq: Once | ORAL | 0 refills | Status: AC

## 2023-02-04 MED ORDER — FAMOTIDINE 40 MG PO TABS: 40.0000 mg | ORAL_TABLET | Freq: Every day | ORAL | 0 refills | Status: DC

## 2023-02-04 NOTE — Addendum Note (Signed)
Addended by: [place] Pink on: 02/04/2023 03:24 PM   Modules accepted: Orders

## 2023-02-04 NOTE — Patient Instructions (Signed)
It was great to see you! Thank you for allowing me to participate in your care!   I recommend that you always bring your medications to each appointment as this makes it easy to ensure we are on the correct medications and helps [place] not miss when refills are needed.  Our plans for today:  -Your urinalysis was collected - I will let you know what these results show - Your A1c was collected - I am sending in 1 dose of diflucan for vaginal itching-if not improved you will need to come back to clinic for vaginal swab testing -Sending in Pepcid to help with the abdominal pain, would like for you to follow-up in 1 month with your primary care provider  We are checking some labs today, I will call you if they are abnormal will send you a MyChart message or a letter if they are normal.  If you do not hear about your labs in the next 2 weeks please let us know.  Take care and seek immediate care sooner if you develop any concerns. Please remember to show up 15 minutes before your scheduled appointment time!  Gerrit Heck, MD [place]

## 2023-02-04 NOTE — Assessment & Plan Note (Signed)
Has itching sensation. Declines any vaginal testing today although I did advise that she be tested given her hx of PID. Will give 1 dose of diflucan-if not improved recommended patient return to care for swabs. -Diflucan 150 mg

## 2023-02-04 NOTE — Assessment & Plan Note (Addendum)
Has been ahving ongoign epigastric abdominal pain for 4 months. Thsi was previously responsive to protonix given by the ED. does not have excessive NSAID use although does take an Advil daily.  Could consider PUD or gastric ulcers.  No signs of GI bleed.  No acute abdomen.  Would like to check for H. pylori today given ongoing symptoms.  Urinalysis does show moderate blood, awaiting microscopy.  Not in significant amount of distress on examination, less likely to be large renal stone. -H. pylori testing -Recommend MiraLAX in future for helping with signs of constipation -Pepcid provided, awaiting H. pylori testing -Follow-up urine micro

## 2023-02-04 NOTE — Progress Notes (Signed)
    SUBJECTIVE:   CHIEF COMPLAINT / HPI:   Abdominal Pain 4 months of abdominal pain. Went to hospital previously for this. Sometimes wakes up with pain right in the middle. Sometimes does have bloating. A lot of epigastric pain. Sometimes has vomiting (very occasionally) and dizziness. Does have bowel movement every 1-2 days but they are very hard. Tired a lot of the time. Taking advil 1 pill daily. Protonix helped her in the past. Does have dysuria and increased frequency. Does have pain in back. Feels warm at night from time to time but no known fevers.   Vaginal Discharge: Patient is a 37 y.o. female presenting with vaginal discharge for 7 days.  She says it is itchy.  She is not  interested in screening for sexually transmitted infections today or vaginal swabs.  LMP 01/21/23  Drinks a lot of water and tired-would like to be checked for diabetes as her family members have diabetes.   PERTINENT  PMH / PSH: previous PID, hx of lap and lysis of adhesions in 2017  OBJECTIVE:   BP 100/70   Pulse 66   Temp 98.3 F (36.8 C)   Ht 5' 3"$  (1.6 m)   Wt 150 lb 3.2 oz (68.1 kg)   LMP 12/21/2022   SpO2 100%   BMI 26.61 kg/m    General: Well appearing, NAD, awake, alert, responsive to questions Head: Normocephalic atraumatic CV: Regular rate and rhythm no murmurs rubs or gallops Respiratory: Clear to ausculation bilaterally, no wheezes rales or crackles, chest rises symmetrically,  no increased work of breathing Abdomen: Soft, tender to palpation in epigastric region - soft, no large amount of CVA tenderness, mild suprapubic tenderness Extremities: Moves upper and lower extremities freely, no edema in LE  ASSESSMENT/PLAN:   Vaginal irritation Has itching sensation. Declines any vaginal testing today although I did advise that she be tested given her hx of PID. Will give 1 dose of diflucan-if not improved recommended patient return to care for swabs. -Diflucan 150 mg   Epigastric  abdominal pain Has been ahving ongoign epigastric abdominal pain for 4 months. Thsi was previously responsive to protonix given by the ED. does not have excessive NSAID use although does take an Advil daily.  Could consider PUD or gastric ulcers.  No signs of GI bleed.  No acute abdomen.  Would like to check for H. pylori today given ongoing symptoms.  Urinalysis does show moderate blood, awaiting microscopy.  Not in significant amount of distress on examination, less likely to be large renal stone. -H. pylori testing -Recommend MiraLAX in future for helping with signs of constipation -Pepcid provided, awaiting H. pylori testing -Follow-up urine micro   A1c normal-will message patient with results  Gerrit Heck, [place]

## 2023-02-05 ENCOUNTER — Other Ambulatory Visit: Payer: Medicaid Other

## 2023-02-15 ENCOUNTER — Other Ambulatory Visit (HOSPITAL_COMMUNITY)
Admission: RE | Admit: 2023-02-15 | Discharge: 2023-02-15 | Disposition: A | Discharge status: Home / Self Care | Payer: Medicaid Other | Source: Ambulatory Visit | Attending: Family Medicine | Admitting: Family Medicine

## 2023-02-15 ENCOUNTER — Other Ambulatory Visit: Payer: Self-pay

## 2023-02-15 ENCOUNTER — Ambulatory Visit: Payer: Medicaid Other | Admitting: Family Medicine

## 2023-02-15 VITALS — BP 101/76 | HR 82 | Temp 98.1°F | Ht 63.0 in | Wt 150.4 lb

## 2023-02-15 DIAGNOSIS — N898 Other specified noninflammatory disorders of vagina: Secondary | ICD-10-CM

## 2023-02-15 DIAGNOSIS — R519 Headache, unspecified: Secondary | ICD-10-CM | POA: Diagnosis present

## 2023-02-15 DIAGNOSIS — Z113 Encounter for screening for infections with a predominantly sexual mode of transmission: Secondary | ICD-10-CM | POA: Diagnosis not present

## 2023-02-15 LAB — POCT WET PREP (WET MOUNT)
Clue Cells Wet Prep Whiff POC: NEGATIVE
Trichomonas Wet Prep HPF POC: ABSENT

## 2023-02-15 MED ORDER — TRIAMCINOLONE ACETONIDE 0.5 % EX OINT: 1.0000 | TOPICAL_OINTMENT | Freq: Two times a day (BID) | CUTANEOUS | 0 refills | Status: DC

## 2023-02-15 NOTE — Progress Notes (Unsigned)
    SUBJECTIVE:   CHIEF COMPLAINT / HPI:   Patient presents for sick symptoms and vaginal itching and discharge. States she felt hot yesterday, did not check temp. Headache for the past week. Describes it as bilateral wrapping from the top to the back of the head. Worsened with light. No throwing up. Has had a cough as well. Denies body aches, diarrhea, constipation. Has been taking a night and day cold medicine which has helped some with sleep. States she is drinking water   Additionally endorses continued vaginal itching and thick discharge. Was seen in clinic on 2/26 and declined a vaginal exam but was treated with Diflucan. States she took it and symptoms still persisted. Agreeable to pelvic exam today. No new sexual partners. Not wanting pregnancy or contraception.  At the end of the visit when discussing AVS she showed me a rash she had at the base of her sternum, recommend a follow up visit for thorough evaluation  PERTINENT  PMH / PSH: Reviewed   OBJECTIVE:   BP 101/76   Pulse 82   Temp 98.1 F (36.7 C) (Oral)   Ht 5\' 3"  (1.6 m)   Wt 150 lb 6.4 oz (68.2 kg)   LMP 12/21/2022   SpO2 100%   BMI 26.64 kg/m    Physical exam General: well appearing, NAD Cardiovascular: RRR, no murmurs Lungs: CTAB. Normal WOB Abdomen: soft, non-distended, non-tender Skin: warm, dry.  Neuro: CN 2-12 in tact. Sensation normal  Pelvic exam: normal external genitalia, vulva, vagina, cervix, uterus and adnexa. Minimal thin discharge present  ASSESSMENT/PLAN:   Headache Patient has been experiencing 1 week of headache w/o N/V or vision changes, cough, and subjective fever x1d. On exam vitals stable and physical exam benign. Symptoms likely due to a viral infection. Low concern for meningitis given reassuring exam and normal ROM of neck. Prescribed Naproxen for her pain and advised to stay well hydrated. Return precautions discussed  Vaginal irritation Patient has been experiencing vaginal  itching and thick discharge. Was seen in clinic on 2/26 but declined pelvic exam and was instead treated with Diflucan. States symptoms are still there so performed pelvic exam today. No new partners, not on contraception but also not desiring pregnancy. Discussed using condoms. Performed wet prep with some bacteria but negative for yeast or BV. Will await G/C.  - wet prep - G/C - HIV, RPR, Hep C      Shary Key, [place]

## 2023-02-15 NOTE — Patient Instructions (Addendum)
It was great seeing you today!  Im sorry you have not been feeling well!  Felt like you may have a viral infection given your body aches, congestion.  Is important to stay well-hydrated during this time.  I have also prescribed naproxen to help with your headaches and bodyaches to use twice a day until the pain resolves.  Today we did a pelvic exam because of your discharge and itching.  Was negative for yeast infection or BV, STI testing still pending.  I will call you or send a MyChart message with those results and send in treatment if needed  Feel free to call with any questions or concerns at any time, at [phone].   Take care,  Dr. Shary Key [facility]

## 2023-02-16 DIAGNOSIS — R519 Headache, unspecified: Secondary | ICD-10-CM | POA: Insufficient documentation

## 2023-02-16 LAB — HIV ANTIBODY (ROUTINE TESTING W REFLEX): HIV Screen 4th Generation wRfx: NONREACTIVE

## 2023-02-16 LAB — HCV AB W REFLEX TO QUANT PCR: HCV Ab: NONREACTIVE

## 2023-02-16 LAB — RPR: RPR Ser Ql: NONREACTIVE

## 2023-02-16 LAB — HCV INTERPRETATION

## 2023-02-16 MED ORDER — NAPROXEN 500 MG PO TABS: 500.0000 mg | ORAL_TABLET | Freq: Two times a day (BID) | ORAL | 0 refills | Status: DC

## 2023-02-16 NOTE — Assessment & Plan Note (Signed)
Patient has been experiencing 1 week of headache w/o N/V or vision changes, cough, and subjective fever x1d. On exam vitals stable and physical exam benign. Symptoms likely due to a viral infection. Low concern for meningitis given reassuring exam and normal ROM of neck. Prescribed Naproxen for her pain and advised to stay well hydrated. Return precautions discussed

## 2023-02-16 NOTE — Assessment & Plan Note (Addendum)
Patient has been experiencing vaginal itching and thick discharge. Was seen in clinic on 2/26 but declined pelvic exam and was instead treated with Diflucan. States symptoms are still there so performed pelvic exam today. No new partners, not on contraception but also not desiring pregnancy. Discussed using condoms. Performed wet prep with some bacteria but negative for yeast or BV. Will await G/C.  - wet prep - G/C - HIV, RPR, Hep C

## 2023-02-18 LAB — CERVICOVAGINAL ANCILLARY ONLY
Bacterial Vaginitis (gardnerella): NEGATIVE
Candida Glabrata: NEGATIVE
Candida Vaginitis: NEGATIVE
Chlamydia: NEGATIVE
Comment: NEGATIVE
Comment: NEGATIVE
Comment: NEGATIVE
Comment: NEGATIVE
Comment: NEGATIVE
Comment: NORMAL
Neisseria Gonorrhea: NEGATIVE
Trichomonas: NEGATIVE

## 2023-02-19 ENCOUNTER — Encounter: Payer: Self-pay | Admitting: Family Medicine

## 2023-02-19 ENCOUNTER — Ambulatory Visit: Payer: Medicaid Other | Admitting: Family Medicine

## 2023-02-19 VITALS — BP 124/78 | HR 104 | Wt 146.0 lb

## 2023-02-19 DIAGNOSIS — Z3201 Encounter for pregnancy test, result positive: Secondary | ICD-10-CM | POA: Diagnosis present

## 2023-02-19 DIAGNOSIS — N912 Amenorrhea, unspecified: Secondary | ICD-10-CM | POA: Diagnosis not present

## 2023-02-19 LAB — POCT URINE PREGNANCY: Preg Test, Ur: POSITIVE — AB

## 2023-02-19 NOTE — Progress Notes (Unsigned)
    SUBJECTIVE:   CHIEF COMPLAINT / HPI:   Confirm pregnancy Missed period by 3 days Has regular periods at baseline, LMP 01/20/23 Home test positive this morning Wants to confirm Not desired pregnancy Wants termination Endorses mild nausea and significant fatigue   PERTINENT  PMH / PSH: ***  OBJECTIVE:   BP 124/78   Pulse (!) 104   Wt 146 lb (66.2 kg)   LMP 01/20/2023   SpO2 98%   BMI 25.86 kg/m   Very tearful   ASSESSMENT/PLAN:   No problem-specific Assessment & Plan notes found for this encounter.     Alcus Dad, MD [place]

## 2023-02-19 NOTE — Patient Instructions (Addendum)
[facility] [place], [place] [address] [phone]  Thursday, March 14th at 8:30am-- bring $600 and a valid ID Monday, March 18th at 11:30am   Take care, Dr Rock Nephew

## 2023-03-04 ENCOUNTER — Ambulatory Visit: Payer: Medicaid Other | Admitting: Family Medicine

## 2023-03-04 ENCOUNTER — Encounter: Payer: Self-pay | Admitting: Family Medicine

## 2023-03-04 VITALS — BP 100/72 | HR 78 | Ht 63.0 in | Wt 150.2 lb

## 2023-03-04 DIAGNOSIS — R1013 Epigastric pain: Secondary | ICD-10-CM

## 2023-03-04 MED ORDER — FAMOTIDINE 40 MG PO TABS: 40.0000 mg | ORAL_TABLET | Freq: Every day | ORAL | 1 refills | Status: DC

## 2023-03-04 NOTE — Patient Instructions (Addendum)
??? ?? ?????? ????? ?????! ?????? ????? ??????? ????? ??? ?? ????? ?? ????????. ??? ???? ?????????? ???? ????? ??? ???? ?????? ??? ????? ?? ??????? ???? ????? ????. ???? ?? ???? ?? ?????? ?????? ???? ??? ????? ???????. ???? ?? ??? ???? ?? ??? ??   3 ????? ??? ??? ????? ???? ???? ?? ?????.  ??? ?????? ????? ???????? ????? ??????? ???????? ????? ????.  ???? ???????? ?? ????? ?????? ?????? ??? 26/4 ?????? 9:30 ??????? ??? ??? ?? ??? ??? ????? ??????? ??? ????? ?? ??????? ???????.   ????? ?? ??? ?????? ??? ??? ???? ????? ?? ?????? ??????!  ????? ??? ????? ????? kan min alraayie ruyatuk alyawma! naqashna alyawm 'aeradaka, wayabdu 'anak qad tueani min alairtijaei. laqad wasifat famwtydin, yurjaa tanawul qurs wahid ywmyan bighadi alnazar ean al'aerad alati tueani minha. ta'akad min tajanub 'ayi musabibat ghidhayiyat tuadiy 'iilaa tafaqum al'aeradi. ta'akad min tarak fajwat la taqilu ean 3 saeat bayn waqt alnawm wakhar wajbatan fi alyawmi. laqad 'ajrayna aydan akhtibarat 'ukhraa, wasa'ukhbiruk bialnatayij eindama taeuda. yurjaa almutabaeat fi maweidik almuqarar altaali yawm 26/4 alsaaeat 9:30 sbahan? 'iidha tara 'aya shay' bayn alhin walakhra, fala tataradad fi alaitisal bimaktabina. shkran lak ealaa alsamah lana bi'an nakun jz'an min rieayatik altibiyiti! shkran lika, duktur ghanta  It was great seeing you today!  Today we discussed your symptoms, it seems like you may have reflux. I have prescribed famotidine, please take 1 tablet daily regardless of your symptoms. Make sure to avoid any food triggers that worsen your symptoms. Make sure to keep at least a 3 hour gap in between bedtime and the last meal of the day.   We also got other testing, I will let you know of the results when they return.   Please follow up at your next scheduled appointment on 4/26 at 9:30 am, if anything arises between now and then, please don't hesitate to contact our office.   Thank you for allowing [place] to be a part  of your medical care!  Thank you, Dr. Larae Grooms

## 2023-03-04 NOTE — Progress Notes (Signed)
    SUBJECTIVE:   CHIEF COMPLAINT / HPI:   Patient presents with abdominal pain for the past a year ago and she has been seen for this in the past. Has been worsening for the past 2 months. Describes as epigastric pain that feels like stabbing pain that radiates to her left upper back. Pain seems to be intermittent and worse at night. Denies any aggravating factors. She is busy during the day and does not seem to pay much attention to it then. Relieving factors include tying a fabric around her stomach. She is taking some medication over the counter which does not seem to provide relief and is unsure of the name. Takes tylenol which provides very little relief at times. Does not seem to be related to her eating. Last BM was yesterday, soft and formed stools. Denies straining. Has a BM daily. Reports feeling warm but unsure of actual fever. Reports chills. Denies weight loss, vomiting, hematemesis, hematochezia, melena or hemoptysis. LMP 02/25/2023. Denies consistent NSAID use. Pepcid seemed to help which she was given in the past. Has not been able to take her protonix due to insurance issues. Does endorse sensation of reflux but denies burning sensation. Denies globus sensation or dysphagia.   OBJECTIVE:   BP 100/72   Pulse 78   Ht 5\' 3"  (1.6 m)   Wt 150 lb 4 oz (68.2 kg)   LMP 01/20/2023   SpO2 100%   BMI 26.62 kg/m   General: Patient well-appearing, in no acute distress. CV: RRR, no murmurs or gallops auscultated Resp: CTAB, no wheezing, rales or rhonchi noted Abdomen: soft, mild to moderate epigastric tenderness noted, nondistended, no rebound tenderness noted, no guarding noted, presence of bowel sounds  ASSESSMENT/PLAN:   Epigastric pain -reassuringly no red flag symptoms observed. Seems most consistent with GERD based off of history of physical exam. Also reassuring that patient's symptoms improved with pepcid.  -pepcid prescribed -H pylori testing ordered, appropriate as patient  has not been on PPI or pepcid recently -pending CMP, CBC and lipase -no indication for EGD at this time however may consider if worsening symptoms. Also considered GU pathology which is less likely. Low concern for ischemic changes given exam. Will obtain lipase to rule out pancreatitis although this was normal on prior testing. Less likely biliary pathology. Considered gastric ulcer although patient does not seem to use NSAIDs frequently but will await blood work as well. -advised to maintain food diary and GERD precautions discussed  -follow up scheduled with PCP in 1 month   Video Arabic interpretation Hinton Dyer [phone]) utilized throughout the entirety of this encounter.   [name], [place]

## 2023-03-04 NOTE — Assessment & Plan Note (Addendum)
-  reassuringly no red flag symptoms observed. Seems most consistent with GERD based off of history of physical exam. Also reassuring that patient's symptoms improved with pepcid.  -pepcid prescribed -H pylori testing ordered, appropriate as patient has not been on PPI or pepcid recently -pending CMP, CBC and lipase -no indication for EGD at this time however may consider if worsening symptoms. Also considered GU pathology which is less likely. Low concern for ischemic changes given exam. Will obtain lipase to rule out pancreatitis although this was normal on prior testing. Less likely biliary pathology. Considered gastric ulcer although patient does not seem to use NSAIDs frequently but will await blood work as well. -advised to maintain food diary and GERD precautions discussed  -follow up scheduled with PCP in 1 month

## 2023-03-05 LAB — CBC
Hematocrit: 39.8 % (ref 34.0–46.6)
Hemoglobin: 12.6 g/dL (ref 11.1–15.9)
MCH: 27.4 pg (ref 26.6–33.0)
MCHC: 31.7 g/dL (ref 31.5–35.7)
MCV: 87 fL (ref 79–97)
Platelets: 255 10*3/uL (ref 150–450)
RBC: 4.6 x10E6/uL (ref 3.77–5.28)
RDW: 12.5 % (ref 11.7–15.4)
WBC: 9.3 10*3/uL (ref 3.4–10.8)

## 2023-03-05 LAB — COMPREHENSIVE METABOLIC PANEL
ALT: 10 IU/L (ref 0–32)
AST: 15 IU/L (ref 0–40)
Albumin/Globulin Ratio: 1.7 (ref 1.2–2.2)
Albumin: 4 g/dL (ref 3.9–4.9)
Alkaline Phosphatase: 78 IU/L (ref 44–121)
BUN/Creatinine Ratio: 7 — ABNORMAL LOW (ref 9–23)
BUN: 4 mg/dL — ABNORMAL LOW (ref 6–20)
Bilirubin Total: 0.2 mg/dL (ref 0.0–1.2)
CO2: 22 mmol/L (ref 20–29)
Calcium: 8.9 mg/dL (ref 8.7–10.2)
Chloride: 104 mmol/L (ref 96–106)
Creatinine, Ser: 0.6 mg/dL (ref 0.57–1.00)
Globulin, Total: 2.4 g/dL (ref 1.5–4.5)
Glucose: 83 mg/dL (ref 70–99)
Potassium: 4.2 mmol/L (ref 3.5–5.2)
Sodium: 139 mmol/L (ref 134–144)
Total Protein: 6.4 g/dL (ref 6.0–8.5)
eGFR: 118 mL/min/{1.73_m2} (ref >59)

## 2023-03-05 LAB — LIPASE: Lipase: 26 U/L (ref 14–72)

## 2023-03-06 ENCOUNTER — Telehealth: Payer: Self-pay | Admitting: Family Medicine

## 2023-03-06 NOTE — Telephone Encounter (Signed)
Patient is returning Dr. Arby Barrette call. She said she received a message that she was needing to speak with her about something.   Patient would like for Dr. Jeani Hawking to call her back.

## 2023-04-05 ENCOUNTER — Ambulatory Visit: Payer: Medicaid Other | Admitting: Family Medicine

## 2023-04-05 ENCOUNTER — Encounter: Payer: Self-pay | Admitting: Family Medicine

## 2023-04-05 ENCOUNTER — Ambulatory Visit: Payer: Self-pay | Admitting: Family Medicine

## 2023-04-05 VITALS — BP 102/64 | HR 91 | Ht 63.0 in | Wt 149.0 lb

## 2023-04-05 DIAGNOSIS — Z332 Encounter for elective termination of pregnancy: Secondary | ICD-10-CM

## 2023-04-05 DIAGNOSIS — R1013 Epigastric pain: Secondary | ICD-10-CM

## 2023-04-05 MED ORDER — SIMETHICONE 80 MG PO CHEW: 80.0000 mg | CHEWABLE_TABLET | Freq: Four times a day (QID) | ORAL | 11 refills | Status: DC | PRN

## 2023-04-05 NOTE — Progress Notes (Signed)
    SUBJECTIVE:   CHIEF COMPLAINT / HPI:   Epigastric pain Last seen for this at seen 3/25 with Dr. Robyne Peers.  At that time, she was recommended to stop Protonix and undergo H. pylori breath test at her next appointment.  CMP, CBC, and lipase all normal at that time.  Today, she reports that she will without the Protonix 3 days ago.  She also reports continuance of the symptoms such as gas, bloating even without eating.  Some flatulence too, which she finds quite embarrassing.  IAB One month ago via p.o. medication Reports she followed up with the clinic as they requested No physical adverse effects Still finds this very emotionally distressing, and she does not have anybody to speak with about this Has not told anyone about this, including her husband She is also very distressed as this goes against her religion She reports she feels safe at home with her husband, denies any physical abuse  PERTINENT  PMH / PSH:  Patient Active Problem List   Diagnosis Date Noted   Headache 02/16/2023   Epigastric pain 02/04/2023   Cough 09/20/2022   Vaginal irritation 09/20/2022   Right lower quadrant abdominal pain 07/16/2022   Patellar tendonitis of both knees 09/20/2021    OBJECTIVE:   BP 102/64   Pulse 91   Ht 5\' 3"  (1.6 m)   Wt 149 lb (67.6 kg)   LMP 03/30/2023   SpO2 98%   BMI 26.39 kg/m    General: Awake, alert, intermittent tearfulness when talking about IAB HEENT: Sclera anicteric and noninjected Cardiac: Regular rate and rhythm, no murmurs Respiratory: CTAB Abdomen: Somewhat distended, soft, nontender  ASSESSMENT/PLAN:   Epigastric pain Unable to perform H. pylori breath test today as she only stopped Protonix 3 days ago. - Mylicon chews for flatulence - Stop Protonix - H. pylori breath test scheduled for 1.5 weeks from now  Abortion in first trimester No complications, patient followed up appropriately with the clinic.  However, she is very emotionally distressed  about this.  She politely declines any support groups.  Provided resources on how to find a therapist to speak with for support.      Fayette Pho, MD [facility]

## 2023-04-05 NOTE — Patient Instructions (Signed)
It was wonderful to see you today. Thank you for allowing me to be a part of your care. Below is a short summary of what we discussed at your visit today:  Gas, bloating, stomach pain Come back May 6 at 4 pm for your H. Pylori breath test.   DO NOT TAKE PROTONIX or other acid reducer medication until after the test.   The day of the test, do not eat or drink anything (including gum or hard candy) for 1 hour before the test.   If the results are normal, I will send you a letter or MyChart message. If the results are abnormal, I will give you a call.    Gas Try simethicon. This has been sent to your pharmacy. This will not interfere with the H. Pylori test.   Counseling See below for counseling or therapy options if you would like.   Please bring all of your medications to every appointment!  If you have any questions or concerns, please do not hesitate to contact us via phone or MyChart message.    Therapy and Counseling Resources Most providers on this list will take Medicaid. Patients with commercial insurance or Medicare should contact their insurance company to get a list of in network providers.  [facility] (takes children) Location 1: [address] [place], [place] [address] Location 2: [address] [place], [place] [address] [phone]   [facility] (spanish speaking therapist available)(habla espanol)(take medicare and medicaid)  [address], [place], [place] [address], [place] [email or web address] [phone]  [facility]:Psychiatry and Counseling [address] [place], [place] [address] [phone]  [facility]   [address] [place], [place] [address]      [phone]  [facility] (spanish available) [address]  [place], [place] [address] [phone]  [facility] (take [facility] and medicare) [address], [address]  [place], [place] [address]       [phone]      [facility] (virtual only) [phone]  [facility] [address] [place], [place] [phone]  [facility]:  [address] [place] [phone]  [facility]:  [address] [place] [place] [phone]  [facility] (under & uninsured) [address]   [place] [phone]    [email or web address]    [facility] [address]  [place]    [phone]  [facility] -[address]     Phone:  [phone]     [facility]-  [address]  [phone]   [facility] [address]      [place], [place] [phone]  [facility]: [address] [place], [place], [place]  [phone]   [facility] (Spanish therapist) [email or web address]  [address] [place]  [address]   [place] [address]    [phone]    [facility]   [address],  [place], [place]  [phone]   [facility]  [address] [place]  [phone]  [facility]  [address] [place], [place]        [phone]  Open Access/Walk In Clinic under & uninsured  [facility]  [address] [place], [place] [phone] Crisis [phone]  [facility] [address],  ([place])  [address] [place]: [phone]) 8:30 - 12; 1 - 2:30  [facility] [employer],  [address], [place]    [phone]):8:30 - 12; 2 - 3PM  [facility] [employer],  [address]  [place]; [phone]):   Mon - Fri 8 AM - 5 PM  [facility] [address] [place]  MWF 12:30 to 3:00 or call to schedule an appointment  [phone]  Specific Provider options Psychology Today  [email or web address] click on find a therapist  enter  your zip code left side and select or tailor a therapist for your specific need.   [facility] Provider Directory [email or web address]  (Medicaid)   Follow all drop down to find a provider  Social Support program [facility] [place] [phone] or PhotoSolver.pl [address], [place], [place] Recovery support and educational   24- Hour Availability:   [facility]  [address] [place], [place] [phone] Crisis [phone]  [facility] [phone]  [place] [facility]  [phone]   [facility]  [phone] (after hours)  Therapeutic Alternative/Mobile Crisis   [phone]  [place] National Suicide Hotline  2287249931 [place])  Call 911 or go to emergency room  [facility]  [phone]);  [place] and [employer]  [phone]); [place], [place], [place], [place], [place], [place], [place]   Fayette Pho, MD

## 2023-04-08 ENCOUNTER — Encounter: Payer: Self-pay | Admitting: Family Medicine

## 2023-04-08 DIAGNOSIS — Z332 Encounter for elective termination of pregnancy: Secondary | ICD-10-CM | POA: Insufficient documentation

## 2023-04-08 HISTORY — DX: Encounter for elective termination of pregnancy: Z33.2

## 2023-04-08 NOTE — Assessment & Plan Note (Signed)
Unable to perform H. pylori breath test today as she only stopped Protonix 3 days ago. - Mylicon chews for flatulence - Stop Protonix - H. pylori breath test scheduled for 1.5 weeks from now

## 2023-04-08 NOTE — Assessment & Plan Note (Signed)
No complications, patient followed up appropriately with the clinic.  However, she is very emotionally distressed about this.  She politely declines any support groups.  Provided resources on how to find a therapist to speak with for support.

## 2023-04-09 NOTE — Addendum Note (Signed)
Addended by: Valetta Close on: 04/09/2023 05:17 PM   Modules accepted: Orders

## 2023-04-15 ENCOUNTER — Other Ambulatory Visit: Payer: Medicaid Other

## 2023-04-15 DIAGNOSIS — R1013 Epigastric pain: Secondary | ICD-10-CM

## 2023-04-16 ENCOUNTER — Other Ambulatory Visit: Payer: Medicaid Other

## 2023-04-16 ENCOUNTER — Telehealth: Payer: Self-pay | Admitting: Family Medicine

## 2023-04-16 DIAGNOSIS — R1013 Epigastric pain: Secondary | ICD-10-CM

## 2023-04-16 MED ORDER — SIMETHICONE 80 MG PO CHEW: 80.0000 mg | CHEWABLE_TABLET | Freq: Four times a day (QID) | ORAL | 11 refills | Status: DC | PRN

## 2023-04-16 MED ORDER — FAMOTIDINE 20 MG PO TABS: 20.0000 mg | ORAL_TABLET | Freq: Every day | ORAL | 1 refills | Status: DC

## 2023-04-16 NOTE — Telephone Encounter (Signed)
Patient in clinic for H. Pylori breath test. Lab tech Molly Maduro reported patient asked about acid reducer medication.   Now that H. Pylori test completed, will send in script for gas x and famotidine.   Fayette Pho, MD

## 2023-04-18 ENCOUNTER — Telehealth: Payer: Self-pay | Admitting: Family Medicine

## 2023-04-18 DIAGNOSIS — A048 Other specified bacterial intestinal infections: Secondary | ICD-10-CM

## 2023-04-18 LAB — H. PYLORI BREATH TEST: H pylori Breath Test: POSITIVE — AB

## 2023-04-18 MED ORDER — BISMUTH/METRONIDAZ/TETRACYCLIN 140-125-125 MG PO CAPS: 3.0000 | ORAL_CAPSULE | Freq: Four times a day (QID) | ORAL | 0 refills | Status: DC

## 2023-04-18 MED ORDER — PANTOPRAZOLE SODIUM 40 MG PO TBEC: 40.0000 mg | DELAYED_RELEASE_TABLET | Freq: Two times a day (BID) | ORAL | 0 refills | Status: DC

## 2023-04-18 NOTE — Telephone Encounter (Signed)
Called patient, no answer, left VM.   Given chronicity of symptoms, will treat for 14 days:  (1) Pylera: 3 tablets per dose, 4 doses per day (QID)   (2) Protonix: 1 tablet per dose, 2 doses per day (BID)  Sent to pharmacy.   Will need repeat H. Pylori breath test 4 weeks after completion of treatment to confirm eradication. Presuming patient picks up meds and starts treatment within 3 days, ideal time window for repeat testing would be the last week in June.   Fayette Pho, MD

## 2023-04-23 ENCOUNTER — Telehealth: Payer: Self-pay

## 2023-04-23 DIAGNOSIS — A048 Other specified bacterial intestinal infections: Secondary | ICD-10-CM

## 2023-04-23 MED ORDER — PANTOPRAZOLE SODIUM 40 MG PO TBEC: 40.0000 mg | DELAYED_RELEASE_TABLET | Freq: Two times a day (BID) | ORAL | 0 refills | Status: DC

## 2023-04-23 MED ORDER — BISMUTH/METRONIDAZ/TETRACYCLIN 140-125-125 MG PO CAPS: 3.0000 | ORAL_CAPSULE | Freq: Four times a day (QID) | ORAL | 0 refills | Status: DC

## 2023-04-23 NOTE — Telephone Encounter (Signed)
Received fax from [facility] Insurance will not pay for the pack. Please change Rx to 3 individual Rx's.  Bismuth/ Metronidaz/ Tetracyclin Take 3 capsules by moth 4 times daily.  Sunday Spillers, CMA

## 2023-04-23 NOTE — Addendum Note (Signed)
Addended by: Valetta Close on: 04/23/2023 04:38 PM   Modules accepted: Orders

## 2023-04-23 NOTE — Telephone Encounter (Signed)
Attempted to call pharmacy to discuss. Currently close until 2 pm for lunch.   [facility] formulary list has Pylera capsule as preferred H. pylori combination.  See below.  Will discuss with pharmacy.      Fayette Pho, MD

## 2023-04-23 NOTE — Telephone Encounter (Signed)
Called pharmacy again regarding Pylera.   They have [employer] on file, not Medicaid healthy blue. I was looking at [facility] formulary when ordering the Pylera.   Pharmacy submitted again under Medicaid and it went through.   Of note, I had written for 14 day treatment just based on prolonged course of symptoms. However, pharmacy tech relayed that Pylera came in packs of 120 pills (10 day supply) and that they could not break the packs. I have re-written the prescription for 10 days (both Pylera and Protonix).   Fayette Pho, MD

## 2023-09-20 ENCOUNTER — Encounter (HOSPITAL_COMMUNITY): Payer: Self-pay

## 2023-09-20 ENCOUNTER — Emergency Department (HOSPITAL_COMMUNITY)
Admission: EM | Admit: 2023-09-20 | Discharge: 2023-09-21 | Discharge status: Against Medical Advice | Payer: BLUE CROSS/BLUE SHIELD | Attending: Emergency Medicine | Admitting: Emergency Medicine

## 2023-09-20 DIAGNOSIS — Z5321 Procedure and treatment not carried out due to patient leaving prior to being seen by health care provider: Secondary | ICD-10-CM | POA: Diagnosis not present

## 2023-09-20 DIAGNOSIS — R42 Dizziness and giddiness: Secondary | ICD-10-CM | POA: Insufficient documentation

## 2023-09-20 DIAGNOSIS — R1031 Right lower quadrant pain: Secondary | ICD-10-CM | POA: Insufficient documentation

## 2023-09-20 LAB — COMPREHENSIVE METABOLIC PANEL
ALT: 11 U/L (ref 0–44)
AST: 20 U/L (ref 15–41)
Albumin: 3.7 g/dL (ref 3.5–5.0)
Alkaline Phosphatase: 64 U/L (ref 38–126)
Anion gap: 8 (ref 5–15)
BUN: 10 mg/dL (ref 6–20)
CO2: 25 mmol/L (ref 22–32)
Calcium: 9 mg/dL (ref 8.9–10.3)
Chloride: 107 mmol/L (ref 98–111)
Creatinine, Ser: 0.66 mg/dL (ref 0.44–1.00)
GFR, Estimated: >60 mL/min (ref >60)
Glucose, Bld: 92 mg/dL (ref 70–99)
Potassium: 3.6 mmol/L (ref 3.5–5.1)
Sodium: 140 mmol/L (ref 135–145)
Total Bilirubin: 0.5 mg/dL (ref 0.3–1.2)
Total Protein: 6.9 g/dL (ref 6.5–8.1)

## 2023-09-20 LAB — CBC
HCT: 39.2 % (ref 36.0–46.0)
Hemoglobin: 12.3 g/dL (ref 12.0–15.0)
MCH: 27.3 pg (ref 26.0–34.0)
MCHC: 31.4 g/dL (ref 30.0–36.0)
MCV: 87.1 fL (ref 80.0–100.0)
Platelets: 249 10*3/uL (ref 150–400)
RBC: 4.5 MIL/uL (ref 3.87–5.11)
RDW: 13.2 % (ref 11.5–15.5)
WBC: 9.4 10*3/uL (ref 4.0–10.5)
nRBC: 0 % (ref 0.0–0.2)

## 2023-09-20 LAB — LIPASE, BLOOD: Lipase: 38 U/L (ref 11–51)

## 2023-09-20 LAB — URINALYSIS, ROUTINE W REFLEX MICROSCOPIC
Bacteria, UA: NONE SEEN
Bilirubin Urine: NEGATIVE
Glucose, UA: NEGATIVE mg/dL
Ketones, ur: NEGATIVE mg/dL
Leukocytes,Ua: NEGATIVE
Nitrite: NEGATIVE
Protein, ur: NEGATIVE mg/dL
Specific Gravity, Urine: 1.013 (ref 1.005–1.030)
pH: 6 (ref 5.0–8.0)

## 2023-09-20 LAB — HCG, SERUM, QUALITATIVE: Preg, Serum: NEGATIVE

## 2023-09-20 NOTE — ED Triage Notes (Signed)
Pt arrived POV from home c/o RLQ pain that started a couple weeks ago. Pt was seen and told to come here to rule out a ovarian cyst or ectopic pregnancy. Pt's pregnancy test with other dr was negative but stated she need to be seen here or by an OBGYN. They did prescribe protonix for the pt but yesterday the pain got worse and today she has been dizzy.

## 2023-09-21 NOTE — ED Notes (Signed)
Pt stated she was leaving AMA due to wait

## 2023-10-08 ENCOUNTER — Other Ambulatory Visit: Payer: Self-pay

## 2023-10-08 ENCOUNTER — Ambulatory Visit (INDEPENDENT_AMBULATORY_CARE_PROVIDER_SITE_OTHER): Payer: BLUE CROSS/BLUE SHIELD | Admitting: Student

## 2023-10-08 ENCOUNTER — Encounter: Payer: Self-pay | Admitting: Student

## 2023-10-08 VITALS — BP 118/79 | HR 81 | Temp 98.1°F | Wt 155.0 lb

## 2023-10-08 DIAGNOSIS — R4586 Emotional lability: Secondary | ICD-10-CM | POA: Diagnosis not present

## 2023-10-08 DIAGNOSIS — R102 Pelvic and perineal pain: Secondary | ICD-10-CM

## 2023-10-08 NOTE — Patient Instructions (Signed)
It was great to see you! Thank you for allowing me to participate in your care!  I recommend that you always bring your medications to each appointment as this makes it easy to ensure you are on the correct medications and helps [place] not miss when refills are needed.  Our plans for today:  - Go to ultrasound appointment - Return next month or sooner if needed - Keep a written record of your periods and pelvic pain  - If you start vomiting, feeling nauseous or the pain is very severe, go to the ED   We are checking some labs today, I will call you if they are abnormal will send you a MyChart message or a letter if they are normal.  If you do not hear about your labs in the next 2 weeks please let us know.  Take care and seek immediate care sooner if you develop any concerns.   Dr. Erick Alley, DO [facility]

## 2023-10-08 NOTE — Progress Notes (Signed)
    SUBJECTIVE:   CHIEF COMPLAINT / HPI:   Pelvic pain Intermittent bilateral pelvic pain present for 2 months, accompanied by nausea but no vomiting. Advil helps a little  Periods have occurred every month but was late 2 months ago and last 2 periods have been very light compared to normal but have also been very painful. Had pelvic US at [facility] about 2 months ago (see below) after period was 4 days late and was given paperwork saying she had a possible molar pregnancy vs cysts.  Went to ED on 09/20/2023 but left AMA before being seen. Serum hCG was negative, CMP, CBC, lipase were normal and UA nonconcerning.  Has been feeling very emotional lately, angers and crys easily. Feel very hot at night, having night sweats for past couple months.    PERTINENT  PMH / PSH: None pertinent  OBJECTIVE:   BP 118/79   Pulse 81   Temp 98.1 F (36.7 C) (Oral)   Wt 155 lb (70.3 kg)   LMP 03/30/2023   SpO2 100%   BMI 27.46 kg/m    General: NAD, pleasant, able to participate in exam Cardiac: RRR, no murmurs. Respiratory: CTAB, normal effort, No wheezes, rales or rhonchi Abdomen: Bowel sounds present, soft, nondistended, mild tenderness to palpation of suprapubic area bilaterally without guarding Skin: warm and dry Neuro: alert, no obvious focal deficits Psych: Normal affect and mood  ASSESSMENT/PLAN:   Pelvic pain Unlikely to be pregnant/molar pregnancy as hCG was recently negative.  Could have cyst on the ovaries.  Unlikely to have ovarian torsion as his pain has been present for 2 months and abdominal exam is reassuring.  Unlikely be infectious as she had no leukocytosis on repeat CBC.  Periods are very painful, consider endometriosis. -Transvaginal ultrasound ordered -Can continue Advil as needed for pain  Emotional lability and hot flashes Will check TSH to rule out thyroid dysfunction.  Can also consider early menopause.  Will CTM, can consider starting Paxil for hot flashes  at future visit.   Follow-up appointment made for next month, can return sooner if needed based on imaging and symptoms  Dr. Erick Alley, DO [facility]

## 2023-10-09 DIAGNOSIS — R102 Pelvic and perineal pain unspecified side: Secondary | ICD-10-CM | POA: Insufficient documentation

## 2023-10-09 DIAGNOSIS — R4586 Emotional lability: Secondary | ICD-10-CM | POA: Insufficient documentation

## 2023-10-09 LAB — TSH: TSH: 4.19 u[IU]/mL (ref 0.450–4.500)

## 2023-10-09 NOTE — Assessment & Plan Note (Addendum)
Unlikely to be pregnant/molar pregnancy as hCG was recently negative.  Could have cyst on the ovaries.  Unlikely to have ovarian torsion as his pain has been present for 2 months and abdominal exam is reassuring.  Unlikely be infectious as she had no leukocytosis on repeat CBC.  Periods are very painful, consider endometriosis. -Transvaginal ultrasound ordered -Can continue Advil as needed for pain

## 2023-10-09 NOTE — Assessment & Plan Note (Signed)
Will check TSH to rule out thyroid dysfunction.  Can also consider early menopause.  Will CTM, can consider starting Paxil for hot flashes at future visit.

## 2023-10-15 ENCOUNTER — Ambulatory Visit (HOSPITAL_COMMUNITY)
Admission: RE | Admit: 2023-10-15 | Discharge: 2023-10-15 | Disposition: A | Discharge status: Home / Self Care | Payer: BLUE CROSS/BLUE SHIELD | Source: Ambulatory Visit | Attending: Family Medicine | Admitting: Family Medicine

## 2023-10-15 DIAGNOSIS — R102 Pelvic and perineal pain: Secondary | ICD-10-CM | POA: Diagnosis present

## 2023-10-25 ENCOUNTER — Telehealth: Payer: Self-pay

## 2023-10-25 NOTE — Telephone Encounter (Signed)
Patient calls nurse line to check on status of pelvic ultrasound. Advised that ultrasound is still pending.   Patient reports continued pelvic and back pain.   Denies vaginal bleeding.   Patient has follow up scheduled with Dr. Yetta Barre on 10/28/23.  ED precautions discussed.   Veronda Prude, RN

## 2023-10-28 ENCOUNTER — Ambulatory Visit: Payer: BLUE CROSS/BLUE SHIELD | Admitting: Student

## 2023-10-28 NOTE — Progress Notes (Deleted)
    SUBJECTIVE:   CHIEF COMPLAINT / HPI:   Pelvic pain Intermittent for going on 3 months and accompanied by nausea.  Labs at ED visits have been reassuring.  See previous note from 10/08/2023 for more details.  At last visit, transvaginal ultrasound was ordered and completed on 10/15/2023.  Unfortunately results are not yet back.  PERTINENT  PMH / PSH: ***  OBJECTIVE:   LMP 03/30/2023  ***  General: NAD, pleasant, able to participate in exam Cardiac: RRR, no murmurs. Respiratory: CTAB, normal effort, No wheezes, rales or rhonchi Abdomen: Bowel sounds present, nontender, nondistended, no hepatosplenomegaly. Extremities: no edema or cyanosis. Skin: warm and dry, no rashes noted Neuro: alert, no obvious focal deficits Psych: Normal affect and mood  ASSESSMENT/PLAN:   No problem-specific Assessment & Plan notes found for this encounter.     Dr. Erick Alley, DO [facility]    {    This will disappear when note is signed, click to select method of visit    :1}

## 2023-11-04 ENCOUNTER — Encounter: Payer: Self-pay | Admitting: Student

## 2023-11-14 ENCOUNTER — Encounter: Payer: Self-pay | Admitting: Family Medicine

## 2023-11-14 ENCOUNTER — Other Ambulatory Visit (HOSPITAL_COMMUNITY)
Admission: RE | Admit: 2023-11-14 | Discharge: 2023-11-14 | Disposition: A | Discharge status: Home / Self Care | Payer: BLUE CROSS/BLUE SHIELD | Source: Ambulatory Visit | Attending: Family Medicine | Admitting: Family Medicine

## 2023-11-14 ENCOUNTER — Ambulatory Visit (INDEPENDENT_AMBULATORY_CARE_PROVIDER_SITE_OTHER): Payer: BLUE CROSS/BLUE SHIELD | Admitting: Family Medicine

## 2023-11-14 VITALS — BP 102/82 | HR 80 | Ht 63.0 in | Wt 152.4 lb

## 2023-11-14 DIAGNOSIS — N898 Other specified noninflammatory disorders of vagina: Secondary | ICD-10-CM | POA: Insufficient documentation

## 2023-11-14 DIAGNOSIS — R102 Pelvic and perineal pain: Secondary | ICD-10-CM

## 2023-11-14 NOTE — Progress Notes (Signed)
    SUBJECTIVE:   CHIEF COMPLAINT / HPI:   Came today because she pain at site of her previous C-section. Had pelvic US a couple weeks ago. Same pain as when she saw Dr. Yetta Barre 1 month ago. This is also associated with back pain. Ongoing for over a month. Feels pain is stabbing. Pain intermittent no know triggers. Avoid foods that make her gassy. Currently sexually active with husband. Does use condoms. No vaginal discharge.  iPad interpreter present for interpreting.  PERTINENT  PMH / PSH: H Pylori, Hx of PID  OBJECTIVE:   BP 102/82   Pulse 80   Ht 5\' 3"  (1.6 m)   Wt 152 lb 6.4 oz (69.1 kg)   LMP 11/09/2023   SpO2 100%   Breastfeeding Unknown   BMI 27.00 kg/m   General: NAD, well appearing Neuro: A&O Respiratory: normal WOB on RA Extremities: Moving all 4 extremities equally Abdomen: Soft, mildly tender suprapubically, old well-healed C-section scar present GU: Per Dr. Lum Babe at patient request for female provider - Normal external genitalia, no obvious erythema, swelling, or lesions. No obvious vaginal discharge, vaginal walls without erythema or lesions. Cervix round, red, no lesions.  No cervical motion tenderness   ASSESSMENT/PLAN:   Assessment & Plan Pelvic pain Significant history of pelvic pain over the past year.  Recently negative and normal transvaginal ultrasound for structural cause.  Patient is not have symptoms of sexually-transmitted infection, she is amenable to testing today.  If her testing today is negative I think it is reasonable to order CT scan of her abdomen to evaluate for postsurgical complication of her pelvic pain, and also recommend follow-up with obstetrics and gynecology.  Patient agreeable to this plan, will treat infections as indicated. -Gonorrhea, chlamydia, trichomonas, BV, candidiasis testing sent  Return if symptoms worsen or fail to improve.  Celine Mans, MD [facility]

## 2023-11-14 NOTE — Patient Instructions (Signed)
It was great to see you! Thank you for allowing me to participate in your care!  Our plans for today:  -You may take Tylenol ibuprofen as needed for your pain. -I will let you know the results of your testing today.  If positive this may need treatment with antibiotics. -If your tests are negative, we may proceed with a CT scan of your abdomen or referral to gynecology.   Please arrive 15 minutes PRIOR to your next scheduled appointment time! If you do not, this affects OTHER patients' care.  Take care and seek immediate care sooner if you develop any concerns.   Celine Mans, MD, PGY-2 [facility] 3:17 PM 11/14/2023  [facility]

## 2023-11-14 NOTE — Assessment & Plan Note (Addendum)
Significant history of pelvic pain over the past year.  Recently negative and normal transvaginal ultrasound for structural cause.  Patient is not have symptoms of sexually-transmitted infection, she is amenable to testing today.  If her testing today is negative I think it is reasonable to order CT scan of her abdomen to evaluate for postsurgical complication of her pelvic pain, and also recommend follow-up with obstetrics and gynecology.  Patient agreeable to this plan, will treat infections as indicated. -Gonorrhea, chlamydia, trichomonas, BV, candidiasis testing sent

## 2023-11-18 LAB — CERVICOVAGINAL ANCILLARY ONLY
Bacterial Vaginitis (gardnerella): NEGATIVE
Candida Glabrata: POSITIVE — AB
Candida Vaginitis: NEGATIVE
Chlamydia: NEGATIVE
Comment: NEGATIVE
Comment: NEGATIVE
Comment: NEGATIVE
Comment: NEGATIVE
Comment: NEGATIVE
Comment: NORMAL
Neisseria Gonorrhea: NEGATIVE
Trichomonas: NEGATIVE

## 2023-11-19 ENCOUNTER — Telehealth: Payer: Self-pay | Admitting: Family Medicine

## 2023-11-19 DIAGNOSIS — R1084 Generalized abdominal pain: Secondary | ICD-10-CM

## 2023-11-19 NOTE — Telephone Encounter (Signed)
Covering Dr. Derrill Center inbox  Attempted to call patient with interpreter with test results and next steps per Dr. Derrill Center note  No answer, left voicemail with callback #  If call back, please inform:  -testing was negative. It did show growth of some yeast but this is most likely normal growth that she is colonized with -For further evaluation, I have placed a referral to GYN and also ordered an abdominal CT scan W contrast. Please schedule the patient for the CT if she calls back  Vonna Drafts, MD

## 2023-12-06 NOTE — Telephone Encounter (Signed)
Contacted the patient to inform her of her results. I tried to schedule her for a follow up visit to discuss the CT patient stated that she does not want the CT done nor does she want to schedule a follow up appointment with else to discuss it.  Patient stated she will give [place] a call if she change her mind.

## 2023-12-16 ENCOUNTER — Ambulatory Visit: Payer: BLUE CROSS/BLUE SHIELD

## 2023-12-16 NOTE — Progress Notes (Deleted)
    SUBJECTIVE:   CHIEF COMPLAINT / HPI:   Ear concern ***  PERTINENT  PMH / PSH: ***  OBJECTIVE:   There were no vitals taken for this visit.  ***  ASSESSMENT/PLAN:   Assessment & Plan    Nancy Coward, MD [facility]

## 2023-12-17 ENCOUNTER — Ambulatory Visit (INDEPENDENT_AMBULATORY_CARE_PROVIDER_SITE_OTHER): Payer: No Typology Code available for payment source | Admitting: Family Medicine

## 2023-12-17 ENCOUNTER — Encounter: Payer: Self-pay | Admitting: Family Medicine

## 2023-12-17 VITALS — BP 102/76 | HR 87 | Wt 146.0 lb

## 2023-12-17 DIAGNOSIS — A048 Other specified bacterial intestinal infections: Secondary | ICD-10-CM | POA: Diagnosis not present

## 2023-12-17 DIAGNOSIS — H9203 Otalgia, bilateral: Secondary | ICD-10-CM | POA: Diagnosis not present

## 2023-12-17 NOTE — Patient Instructions (Signed)
 Good to see you today - Thank you for coming in  Things we discussed today:  1) Your ear pain is most likely due to eustachian tube dysfunction, which causes increased pressure inside your inner ear.  - Start using Flonase 1 spray in each nostril once a day.  This will help decrease the inflammation and pressure inside your sinuses and in her ear -Start taking Claritin  once a day -Come back in 1 month if your symptoms are not improving

## 2023-12-17 NOTE — Progress Notes (Signed)
    SUBJECTIVE:   CHIEF COMPLAINT / HPI:   NS is a 38yo F p/f ear infection - 2 months of ear pain in both ears, described as sharp - Pain comes on acutely, no clear triggers - Denies issues with hearing - Sometimes take ibuprofen  - Occurs randomly throughout the day, happens multiple times - Reports occasional popping senation in ears - Denies congestion or sinus pressure  H Pylori - has cramping pain in her stomach. - Was previously diagnosed with H. pylori via breath test on 04/16/2023, and was prescribed Pylera - did not take pylera, she was on vacation at that time and did not receive the notification.  OBJECTIVE:   BP 102/76   Pulse 87   Wt 146 lb (66.2 kg)   LMP 12/06/2023   SpO2 98%   BMI 25.86 kg/m   General: Alert, pleasant woman. NAD. HEENT: NCAT. MMM. BL tm pearly, no effusion or erythema CV: RRR, no murmurs. Cap refill <2. Resp: CTAB, no wheezing or crackles. Normal WOB on RA.  Abm: Soft, nontender, nondistended. BS present. Ext: Moves all ext spontaneously Skin: Warm, well perfused     ASSESSMENT/PLAN:   Assessment & Plan Otalgia of both ears Differential includes eustachian tube dysfunction, AOM, cerumen impaction. Eustachian tube is most likely given short duration of epiusodes and associated popping sensation. Ear exam benign appreaing making AOM and cerumen less likely.  - Advised to start flonase daily in each nostril - Advised to start Claritin  -Follow-up in 1 month if symptoms not improving H. pylori infection Re-sent scription for Pylera for 14 days.  Advised patient to take Pylera and Protonix , then stop after 14 days, and then come back for retest after 2 weeks of being off therapy.     Twyla Nearing, MD [facility]

## 2023-12-18 ENCOUNTER — Encounter: Payer: Self-pay | Admitting: Family Medicine

## 2023-12-18 MED ORDER — BIS SUBCIT-METRONID-TETRACYC 140-125-125 MG PO CAPS
3.0000 | ORAL_CAPSULE | Freq: Four times a day (QID) | ORAL | 0 refills | Status: DC
Start: 2023-12-18 — End: 2024-08-11

## 2023-12-19 ENCOUNTER — Telehealth: Payer: Self-pay

## 2023-12-19 NOTE — Telephone Encounter (Signed)
 Received call from [facility] regarding upcoming CT scan. Ileana reports that imaging is requiring peer to peer for approval.   She provided us  with the number to call to start process. Phone number: [phone] Ref number: [phone]  Patient is scheduled for Monday, 12/23/23 at 3 pm.   Forwarding to ordering provider.   Chiquita JAYSON English, RN

## 2023-12-20 NOTE — Telephone Encounter (Signed)
 Called patient and LVM for her to call back to office, as there seems to be confusion on if patient wants to proceed with imaging.   Veronda Prude, RN

## 2023-12-23 NOTE — Telephone Encounter (Signed)
 Received prior authorization request for CT. Given patient does not want CT, I will not complete this.

## 2023-12-31 ENCOUNTER — Encounter: Payer: Self-pay | Admitting: Family Medicine

## 2023-12-31 ENCOUNTER — Ambulatory Visit (INDEPENDENT_AMBULATORY_CARE_PROVIDER_SITE_OTHER): Payer: No Typology Code available for payment source | Admitting: Family Medicine

## 2023-12-31 VITALS — BP 111/82 | HR 76 | Ht 63.0 in | Wt 158.0 lb

## 2023-12-31 DIAGNOSIS — M7712 Lateral epicondylitis, left elbow: Secondary | ICD-10-CM

## 2023-12-31 MED ORDER — NAPROXEN 500 MG PO TABS: 500.0000 mg | ORAL_TABLET | Freq: Two times a day (BID) | ORAL | 0 refills | Status: AC

## 2023-12-31 MED ORDER — NAPROXEN 500 MG PO TABS
500.0000 mg | ORAL_TABLET | Freq: Two times a day (BID) | ORAL | 0 refills | Status: DC
Start: 2023-12-31 — End: 2023-12-31

## 2023-12-31 NOTE — Progress Notes (Deleted)
    SUBJECTIVE:   CHIEF COMPLAINT / HPI: left hand pain  ***  PERTINENT  PMH / PSH: Reviewed.  OBJECTIVE:   LMP 12/06/2023   ***  ASSESSMENT/PLAN:   Assessment & Plan  No follow-ups on file.  Celine Mans, MD [facility]

## 2023-12-31 NOTE — Progress Notes (Signed)
    SUBJECTIVE:   CHIEF COMPLAINT / HPI: left hand pain  Nancy Oconnor [name] is a 38 year old presenting with 1 month history of left wrist pain.   Wrist Pain: Pain began 1 month ago. Denies recent trauma. Works in Development worker, community at [employer]. She localizes the pain to the medial side of left arm down to pinky. Pain is worse at elbow. She is unable to characterize if pain is achy, dull, sharp. She states the elbow constantly hurts all day and is worse at night. She has not tried any medications. Pain is aggravated by movement.   Of note, she stopped going to the gym two weeks ago because of the pain.   PERTINENT  PMH / PSH: Reviewed.  OBJECTIVE:   BP 111/82   Pulse 76   Ht 5\' 3"  (1.6 m)   Wt 158 lb (71.7 kg)   LMP 12/06/2023   SpO2 100%   BMI 27.99 kg/m   Gen: well appearing, cooperative on exam MSK: No obvious erythema, swelling, bruising, deformity, tenderness along left common extensor muscles. 5/5 upper arm strength bilaterally.Interosseous muscles intact bilaterally. Grip strength 5/5 bilaterally. Positive Finklestein test. Pain with left wrist extension.  Full range of motion elbow extension and flexion, wrist flexion extension CV: RRR Pulm: CTAB  ASSESSMENT/PLAN:    Nancy Oconnor is a 38 year old presenting with 1 month history of left wrist pain.  Assessment & Plan Lateral epicondylitis of left elbow Ddx of wrist pain includes carpel tunnel, DeQuarvain's tendonitis, and lateral epicondylitis.  Pain that is worse at night fits carpel tunnel, however, would expect the pain to be in her thumb, pointer, and middle finger (median nerve distribution). DeQuarvain's tendonitis was considered given the positive Finklestein's test and repetitive motion of her occupation and exercise. Lateral epicondylitis is most likely given the involvement of the elbow and point tenderness found on physical exam. Plan:  - naproxen 500 mg BID  for 2 weeks  - PT referral     Return in about 2 weeks (around 01/14/2024).   Nancy Oconnor, MS3 [facility]   I was personally present and performed or re-performed the history, physical exam and medical decision making activities of this student and have verified that the student findings are accurately documented in the student/resident's note. I have made edits and changes where appropriate, and agree with the plan.  Nancy Mans, MD, PGY-2 [facility] 4:30 PM 12/31/2023                   12/31/2023, 4:30 PM  Additionally, Suspect overuse injury and inflammation to common extensor muscle/tendons with likely lateral epicondylitis given repetitive extensor motions at work.  Will treat as above with short course of naproxen with follow-up for improvement in 2 weeks.  If improving will start dedicated physical therapy at that time.

## 2023-12-31 NOTE — Patient Instructions (Addendum)
It was great to see you! Thank you for allowing me to participate in your care!  Our plans for today:  - Avoid exercise that hurt your elbow. - Please take Naproxen to your pharmacy, you may take this twice a day for 2 weeks. - I will see you again in 2 weeks to see how you are doing.   Please arrive 15 minutes PRIOR to your next scheduled appointment time! If you do not, this affects OTHER patients' care.  Take care and seek immediate care sooner if you develop any concerns.   Celine Mans, MD, PGY-2 [facility] 3:14 PM 12/31/2023  [facility]

## 2024-05-19 ENCOUNTER — Encounter: Payer: Self-pay | Admitting: *Deleted

## 2024-06-01 ENCOUNTER — Encounter: Payer: Self-pay | Admitting: Student

## 2024-06-01 ENCOUNTER — Ambulatory Visit (INDEPENDENT_AMBULATORY_CARE_PROVIDER_SITE_OTHER): Admitting: Student

## 2024-06-01 VITALS — BP 100/78 | HR 72 | Ht 63.0 in | Wt 150.2 lb

## 2024-06-01 DIAGNOSIS — N898 Other specified noninflammatory disorders of vagina: Secondary | ICD-10-CM | POA: Diagnosis not present

## 2024-06-01 DIAGNOSIS — K529 Noninfective gastroenteritis and colitis, unspecified: Secondary | ICD-10-CM | POA: Diagnosis not present

## 2024-06-01 MED ORDER — ONDANSETRON HCL 4 MG PO TABS: 4.0000 mg | ORAL_TABLET | Freq: Three times a day (TID) | ORAL | 0 refills | Status: DC | PRN

## 2024-06-01 MED ORDER — FLUCONAZOLE 150 MG PO TABS: 150.0000 mg | ORAL_TABLET | Freq: Once | ORAL | 0 refills | Status: AC

## 2024-06-01 NOTE — Patient Instructions (Addendum)
 It was great seeing you today.  As we discussed, - You likely have viral gastroenteritis. Drink plenty of fluids, try crackers/banans/rice. I sent in Zofran  for nausea, only take as needed every 8 hours - If cannot keep any fluids down, severe abdominal pain, fever then please seek urgent care - Fluconazole  to be used for yeast infection   If you have any questions or concerns, please feel free to call the clinic.   Have a wonderful day,  Dr. Barabara Dama [facility] [phone]

## 2024-06-01 NOTE — Progress Notes (Signed)
    SUBJECTIVE:   CHIEF COMPLAINT / HPI:   Vaginal discharge -For 7 days -Reports itchiness as well -Trialed over the counter Monistat  -It is thick and white and watery as well -Sexually active with 1 partner  Diarrhea - Started for 3 days - Also w/ abdominal pain - No fever - Also w/ vomiting and nausea  PERTINENT  PMH / PSH: ***  OBJECTIVE:   BP 100/78   Pulse 72   Ht 5' 3 (1.6 m)   Wt 150 lb 3.2 oz (68.1 kg)   LMP 05/19/2024   SpO2 98%   BMI 26.61 kg/m  ***   ASSESSMENT/PLAN:   No problem-specific Assessment & Plan notes found for this encounter.     Nancy Costabile, DO [facility]    {    This will disappear when note is signed, click to select method of visit    :1}

## 2024-06-02 ENCOUNTER — Ambulatory Visit (HOSPITAL_COMMUNITY)

## 2024-06-03 DIAGNOSIS — N898 Other specified noninflammatory disorders of vagina: Secondary | ICD-10-CM | POA: Insufficient documentation

## 2024-06-03 DIAGNOSIS — K529 Noninfective gastroenteritis and colitis, unspecified: Secondary | ICD-10-CM | POA: Insufficient documentation

## 2024-06-03 NOTE — Assessment & Plan Note (Signed)
 Resolving With persistent nausea, prescribed Zofran  4 mg as needed every 8 hours

## 2024-06-03 NOTE — Assessment & Plan Note (Signed)
 Likely vaginal candidiasis, treat empirically with fluconazole  Encouraged pelvic exam with full testing but patient declined today

## 2024-07-06 ENCOUNTER — Ambulatory Visit: Payer: Self-pay | Admitting: Family Medicine

## 2024-07-06 ENCOUNTER — Ambulatory Visit (INDEPENDENT_AMBULATORY_CARE_PROVIDER_SITE_OTHER): Admitting: Family Medicine

## 2024-07-06 ENCOUNTER — Encounter: Payer: Self-pay | Admitting: Family Medicine

## 2024-07-06 VITALS — BP 92/73 | HR 74 | Ht 63.0 in | Wt 151.2 lb

## 2024-07-06 DIAGNOSIS — N898 Other specified noninflammatory disorders of vagina: Secondary | ICD-10-CM

## 2024-07-06 DIAGNOSIS — R110 Nausea: Secondary | ICD-10-CM

## 2024-07-06 DIAGNOSIS — R42 Dizziness and giddiness: Secondary | ICD-10-CM | POA: Diagnosis not present

## 2024-07-06 DIAGNOSIS — N9089 Other specified noninflammatory disorders of vulva and perineum: Secondary | ICD-10-CM

## 2024-07-06 LAB — POCT WET PREP (WET MOUNT)
Clue Cells Wet Prep Whiff POC: NEGATIVE
Trichomonas Wet Prep HPF POC: ABSENT

## 2024-07-06 LAB — POCT URINE PREGNANCY: Preg Test, Ur: NEGATIVE

## 2024-07-06 MED ORDER — HYDROCORTISONE 2.5 % EX CREA: TOPICAL_CREAM | Freq: Two times a day (BID) | CUTANEOUS | 0 refills | Status: DC

## 2024-07-06 NOTE — Assessment & Plan Note (Addendum)
 Will send in appropriate treatment based on lab results - POCT Wet Prep [facility] [place]) - POCT urine pregnancy

## 2024-07-06 NOTE — Patient Instructions (Addendum)
 Thank you for coming in today! Here is a summary of what we discussed:  - You can use the hydrocortisone  for the itchy lesion  - Please continue using the Zofran  every 8 hours as needed for nausea  - Please follow-up with us  if your symptoms continue or worsen over the next few days  We are checking some labs today. If they are abnormal, I will call you. If they are normal, I will send you a MyChart message (if it is active) or a letter in the mail. If you do not hear about your labs in the next 2 weeks, please call the office.   Please call the clinic at [phone] if your symptoms worsen or you have any concerns.  Best, Dr Adele

## 2024-07-06 NOTE — Progress Notes (Signed)
    SUBJECTIVE:   CHIEF COMPLAINT / HPI:   Patient's daughter assisted with interpretation per pt preference  Nausea, dizziness - going on for a while but worse since yesterday - Daughter states she hasn't been eating as much for the last 2 days because she was nauseous - 1 episode of NB/NB emesis, some diarrhea - believes she had fever twice last week, can't remember how high.  Daughter believes it was around 66 - Fell yesterday- tried to stand up and fell back down on the couch. Did not hit her head. - No changes in medicine recently - Some chest pain, feels like heart burn (has history of this) - SOB occasionally, just randomly when sitting at home relaxing - Contraception? None currently - On day 7 of menstrual cycle currently, not different from usual. Unclear if menses occur at regular intervals  Bump on vagina - pt noticed this during the past week, states it is itchy but non painful - also endorses some vaginal discharge, asking if she has a yeast infection - denies new sexual partners and declines STI testing - denies dysuria and urinary frequency  PERTINENT  PMH / PSH: reviewed  OBJECTIVE:   BP 92/73   Pulse 74   Ht 5' 3 (1.6 m)   Wt 151 lb 3.2 oz (68.6 kg)   LMP 06/18/2024   SpO2 99%   BMI 26.78 kg/m   General: awake and conversant, in no acute distress Pulm: normal WOB on RA GU (chaperone Dayshia Ottley, CMA present): 2-3 cm flesh-colored nodule on R labia majora, nontender to palpation, no edema. Normal vaginal rugae. Scant thick white discharge around cervical os Psych: appropriate mood and affect  ASSESSMENT/PLAN:   Assessment & Plan Nausea Dizziness Possible viral gastroenteritis given episode of emesis and diarrhea. Reassured by overall well appearance and stable vitals today. Will collect UPT as timing of menses is unclear (history somewhat limited 2/2 ESL patient). Will also check CBC and BMP for signs of infection/dehydration. Will defer EKG given  description of chest pain, overall stability, and lack of risk factors for ACS. - CBC - Basic metabolic panel with GFR - POCT urine pregnancy - Continue as needed Zofran  - will arrange follow up based on lab results Vaginal discharge Will send in appropriate treatment based on lab results - POCT Wet Prep [facility]) - POCT urine pregnancy Vulvar lesion Nontender, pruritic lesion on labia majora. Possible epidermoid cyst vs other benign etiology. Last pap in 2023 ASCUS with negative hrHPV, will need next pap in 2026. Will trial hydrocortisone  for sx relief. - hydrocortisone  2.5 % cream; Apply topically 2 (two) times daily.  Dispense: 30 g; Refill: 0 - reviewed return precautions     Rea Raring, MD [facility]

## 2024-07-07 LAB — BASIC METABOLIC PANEL WITH GFR
BUN/Creatinine Ratio: 8 — ABNORMAL LOW (ref 9–23)
BUN: 6 mg/dL (ref 6–24)
CO2: 20 mmol/L (ref 20–29)
Calcium: 9.2 mg/dL (ref 8.7–10.2)
Chloride: 103 mmol/L (ref 96–106)
Creatinine, Ser: 0.72 mg/dL (ref 0.57–1.00)
Glucose: 85 mg/dL (ref 70–99)
Potassium: 4.3 mmol/L (ref 3.5–5.2)
Sodium: 138 mmol/L (ref 134–144)
eGFR: 108 mL/min/1.73 (ref >59)

## 2024-07-07 LAB — CBC
Hematocrit: 44 % (ref 34.0–46.6)
Hemoglobin: 13.6 g/dL (ref 11.1–15.9)
MCH: 27.9 pg (ref 26.6–33.0)
MCHC: 30.9 g/dL — ABNORMAL LOW (ref 31.5–35.7)
MCV: 90 fL (ref 79–97)
Platelets: 251 x10E3/uL (ref 150–450)
RBC: 4.88 x10E6/uL (ref 3.77–5.28)
RDW: 12.8 % (ref 11.7–15.4)
WBC: 8.7 x10E3/uL (ref 3.4–10.8)

## 2024-08-11 ENCOUNTER — Encounter: Payer: Self-pay | Admitting: Family Medicine

## 2024-08-11 ENCOUNTER — Ambulatory Visit (INDEPENDENT_AMBULATORY_CARE_PROVIDER_SITE_OTHER): Admitting: Family Medicine

## 2024-08-11 VITALS — BP 91/71 | HR 78 | Wt 156.6 lb

## 2024-08-11 DIAGNOSIS — A048 Other specified bacterial intestinal infections: Secondary | ICD-10-CM

## 2024-08-11 DIAGNOSIS — K582 Mixed irritable bowel syndrome: Secondary | ICD-10-CM | POA: Diagnosis not present

## 2024-08-11 DIAGNOSIS — R140 Abdominal distension (gaseous): Secondary | ICD-10-CM | POA: Diagnosis not present

## 2024-08-11 DIAGNOSIS — R1084 Generalized abdominal pain: Secondary | ICD-10-CM | POA: Diagnosis not present

## 2024-08-11 DIAGNOSIS — N898 Other specified noninflammatory disorders of vagina: Secondary | ICD-10-CM

## 2024-08-11 MED ORDER — FLUCONAZOLE 150 MG PO TABS
150.0000 mg | ORAL_TABLET | Freq: Once | ORAL | 0 refills | Status: AC
Start: 2024-08-11 — End: 2024-08-11

## 2024-08-11 MED ORDER — BISMUTH/METRONIDAZ/TETRACYCLIN 140-125-125 MG PO CAPS
3.0000 | ORAL_CAPSULE | Freq: Four times a day (QID) | ORAL | 0 refills | Status: DC
Start: 2024-08-11 — End: 2024-08-17

## 2024-08-11 MED ORDER — DICYCLOMINE HCL 20 MG PO TABS: 20.0000 mg | ORAL_TABLET | Freq: Four times a day (QID) | ORAL | 0 refills | Status: AC

## 2024-08-11 NOTE — Assessment & Plan Note (Signed)
 Declined repeat pelvic exam today.  Suspect yeast infection given description and prior infections, but cannot verify today.  Reasonable to attempt empiric treatment at this point again. - Fluconazole  150 mg x1 - Counseled to return for pelvic exam if not improving, would benefit from full STI testing if possible

## 2024-08-11 NOTE — Patient Instructions (Addendum)
????? ?????? ?????! ????? ???????? ????? ??? ???? ??????? ??????? ????????.  ??????? ?????:   1. ???? ??????? ???????? ??? ?? ???? ???????? 3 ?????? 4 ???? ?????? ???? 10 ????. ??? ?????? ??? ??????? ?????? ??? ??? ??????? ????????. ????? ?????? ??????? ??? ???. ?? ?????? ????? ??????????? ???? 14 ????? ??? ?????.  2. ??????? ?????? ???? ????? ???? ?? ????????????. ??? ?? ????? ??? ?????? ????????? ??? ?????? ?????? ??? ?????.  3. ??????? ??????? ?????? ?? ??????? ???????? ??? ?? ???? ?????????? ?????? ????? ???????? ??????? ??????. ?????? ????? ??? ????? ?????? ?????? ????? ?????? ????? ????? ??????? ???????? ???????? ?? ???? ?????. ????? ??????? ??? ??  ?????? ?? ?? ???? ???? ???????. ??? ???? ????? ???????? ??? ???? ???? ??? ???? ????? ????? ???? ??????. ???? ????? ????? ?? ?? ?????? ????????.  ??? ???? ?????? ??? ??????? ??? ??? ????.  _________________________________________  It was great to see you today! Thank you for choosing [facility] for your primary care.  Today we addressed: 1. H. pylori infection I am prescribing you Pylera 3 pills 4 times a day for 10 days. After you finish the medicine, we will need to test you for H. Pylori. Please come back to the clinic in 1 month.  You cannot take your pantoprazole  for 14 days prior to the test.  2. Vaginal discharge Please take a dose of the fluconazole .  IF this does not help, you will need to return to get a pelvic exam.  3. Irritable bowel syndrome with both constipation and diarrhea I am prescribing dicyclomine  daily for your bloating and stomach discomfort. I am also referring you to our gastrointestinal doctors for a possible scope of your intestines and stomach, they will call you about that.  Please let us  know if you do not hear from them within 2 weeks. You should take MiraLAX  over the counter until you have a daily soft bowel movement.  Start with a small amount and increase the medicine slowly.  You should  return to our clinic in 1 month.  Thank you for coming to see us  at [facility] and for the opportunity to care for you! Nancy Oconnor, Bethanie Bloxom, MD 08/11/2024, 4:14 PM

## 2024-08-11 NOTE — Progress Notes (Signed)
 SUBJECTIVE:   CHIEF COMPLAINT / HPI:  Nancy Oconnor is a 38 y.o. female with a pertinent past medical history of history of recurrent abdominal pain and vaginal discharge presenting to the clinic for bloating and abdominal pain.  Arabic video interpreter utilized for duration of visit.  Abdominal pain, bloating, constipation Patient describes bloating of the abdomen and increased gas. Reports she has irritable bowel syndrome. Also reports sharp rectal pain. Reports that this has been going for months. Notes that she had a positive result for H. pylori in January, was on PPI at the time. Was prescribed Pylera in January, but never picked it up, reports that she never got it. In July and August of 2024, she picked up medicines from [place] for treatment and took them (unclear medicines). Showed me results of scope from [place] with diagnosis of dyspepsia, gastritis, duodenitis. Has tried herbs, OTC colon cleanser (appears to be a diuretic of some type). Patient reports BM every other day. Reports bloating of abdomen when she wakes up. Does note some vaginal discharge, previously with fluconazole  prescribed which she also reports she never got it. Wet prep was negative for BV in July, patient reports no change with her symptoms since. Continues to decline STI testing, no new partners. Denies melena, hematochezia.  Epistaxis For the past 2 weeks, has been waking up with a small amount of dried blood at nares. No profuse bleeding, has not required pressure to abate bleeding. No history of heavy menstrual cycles, usually last 3-5 days of moderate bleeding. No family history of bleeding disorders. Does no pick nose.   PERTINENT PMH / PSH: Recurrent abdominal and pelvic pain presentations History of H. Pylori, treatment not completed  *Remainder reviewed in problem list.   OBJECTIVE:   BP 91/71   Pulse 78   Wt 156 lb 9.6 oz (71 kg)   LMP 08/07/2024 (Exact Date)   SpO2 100%   BMI  27.74 kg/m   General: Age-appropriate, resting comfortably in chair, NAD, alert and at baseline. HEENT: No conjunctival pallor. Cardiovascular: Regular rate and rhythm. Normal S1/S2. No murmurs, rubs, or gallops appreciated. 2+ radial pulses. Pulmonary: Normal WOB on RA. Abdominal: Mild abdominal TTP over LLQ and RLQ.  No suprapubic or epigastric TTP.  No rebound or guarding, no HSM.  Normoactive bowel sounds. Skin: Warm and dry.  No rashes grossly. Extremities: No peripheral edema bilaterally. Capillary refill <2 seconds.   ASSESSMENT/PLAN:   Assessment & Plan Irritable bowel syndrome with both constipation and diarrhea Generalized abdominal pain Bloating Favor IBS given symptomatology.  Predominately IBS-C with rare diarrhea.  Dyspepsia likely contributory.  Does have gastritis noted on scope read from [place], will pursue GI referral.  Also considering PUD in setting of H. Pylori infection, but reassuringly on hematochezia or melena.  Also reassuringly no nausea/vomiting to indicate more acute pathology. - MiraLAX  OTC recommended, titrate to daily soft BM - Dicyclomine  20 mg daily - Avoid colon cleanses - Amb ref GI - Consider CBC next visit if concern for GI bleed rises H. pylori infection While patient reports treating self with medications from [place], cannot verify treatment course or selection.  Patient prefers to proceed with treatment rather than retesting for H. Pylori today, which is reasonable. - Pylera 3 pills QID x10 days - Return in 1 month and perform test of cure for H. Pylori - Patient counseled to discontinue PPI 14 days prior to test of cure appointment Vaginal discharge Declined repeat pelvic exam today.  Suspect  yeast infection given description and prior infections, but cannot verify today.  Reasonable to attempt empiric treatment at this point again. - Fluconazole  150 mg x1 - Counseled to return for pelvic exam if not improving, would benefit from full STI testing  if possible  Follow up in 1 month.  Nancy Ravan Toma, MD [facility]

## 2024-08-12 ENCOUNTER — Telehealth: Payer: Self-pay

## 2024-08-12 ENCOUNTER — Other Ambulatory Visit (HOSPITAL_COMMUNITY): Payer: Self-pay

## 2024-08-12 NOTE — Telephone Encounter (Signed)
 Prior authorization submitted for bismuth -metronidazole -tetracycline (PYLERA) capsule 140-125-125 to [employer] via Latent.   Key: [id number]

## 2024-08-14 NOTE — Telephone Encounter (Signed)
 Patient calls nurse line regarding medication issues.   She reports that medication is too expensive and is requesting alternative.   Will forward to prescriber.   Nancy JAYSON English, RN

## 2024-08-14 NOTE — Telephone Encounter (Signed)
 Pharmacy Patient Advocate Encounter  Received notification from [facility] [employer] that Prior Authorization for Bis Subcit-Metronid-Tetracyc 140-125-125MG  capsules has been DENIED.  Full denial letter will be uploaded to the media tab. See denial reason below.    PA #/Case ID/Reference #: [id number]

## 2024-08-17 ENCOUNTER — Telehealth: Payer: Self-pay | Admitting: Pharmacist

## 2024-08-17 ENCOUNTER — Other Ambulatory Visit (HOSPITAL_COMMUNITY): Payer: Self-pay

## 2024-08-17 MED ORDER — PANTOPRAZOLE SODIUM 40 MG PO TBEC: 40.0000 mg | DELAYED_RELEASE_TABLET | Freq: Two times a day (BID) | ORAL | 0 refills | Status: AC

## 2024-08-17 MED ORDER — METRONIDAZOLE 250 MG PO TABS: 250.0000 mg | ORAL_TABLET | Freq: Four times a day (QID) | ORAL | 0 refills | Status: DC

## 2024-08-17 MED ORDER — PEPTO-BISMOL 262 MG PO TABS: 2.0000 | ORAL_TABLET | Freq: Four times a day (QID) | ORAL | 0 refills | Status: AC

## 2024-08-17 MED ORDER — TETRACYCLINE HCL 500 MG PO CAPS: 500.0000 mg | ORAL_CAPSULE | Freq: Four times a day (QID) | ORAL | 0 refills | Status: DC

## 2024-08-17 NOTE — Addendum Note (Signed)
 Addended by: Avaley Coop G on: 08/17/2024 04:16 PM   Modules accepted: Orders

## 2024-08-17 NOTE — Telephone Encounter (Signed)
 Called patient's pharmacy regarding H. Pylori medication regimen. Pharmacy technician stated they will be able to fill and assist patient in picking up medications.   Patient contacted for follow-up of H. Pylori medication regimen (Pepto-bismol, metronidazole , tetracycline , pantoprazole ). Discussed picking up medication from pharmacy.   Scheduled appointment for patient to bring medications in to discuss regimen.   Total time with patient call and documentation of interaction: 11 minutes.

## 2024-08-18 NOTE — Telephone Encounter (Signed)
 Reviewed and agree with Dr Rennis plan.

## 2024-08-20 ENCOUNTER — Encounter: Payer: Self-pay | Admitting: Pharmacist

## 2024-08-20 ENCOUNTER — Ambulatory Visit (INDEPENDENT_AMBULATORY_CARE_PROVIDER_SITE_OTHER): Admitting: Pharmacist

## 2024-08-20 VITALS — BP 100/70 | HR 88 | Wt 155.8 lb

## 2024-08-20 DIAGNOSIS — R1013 Epigastric pain: Secondary | ICD-10-CM

## 2024-08-20 NOTE — Patient Instructions (Signed)
 It was nice to see you today!  Medication Changes: START pepto-bismol 2 tablets 4 times a day (morning, lunch, dinner, bedtime) START tetracycline  1 tablet 4 times a day (morning, lunch, dinner, bedtime) START metronidazole  1 tablet 4 times a day (morning, lunch, dinner, bedtime) START pantoprazole  1 tablet 2 times a day (morning, dinner)   Use medications until they run out. Do not stop medications early, even if you are feeling better.   Continue all other medication the same.

## 2024-08-20 NOTE — Progress Notes (Signed)
    S:     Chief Complaint  Patient presents with   Medication Management    H. Pylori medication management   38 y.o. who presents for medication management due to concern of polypharmacy.  Patient arrives in good spirits and presents without assistance. Patient brought in medications from pharmacy.   Patient was referred and last seen by Primary Care Provider, Dr. Toma, on 08/11/2024.  At last visit had continued H. Pylori infection for which she was unable to to pick up Pylera for due to cost.   PMH is significant for H, Pylori infection.   Insurance Coverage: [employer]  O:  Review of Systems  Gastrointestinal:  Positive for abdominal pain.  Musculoskeletal:  Positive for neck pain.       Bilateral shoulder pain ~ 1 month duration  All other systems reviewed and are negative.   Physical Exam Vitals reviewed.  Constitutional:      Appearance: Normal appearance. She is normal weight.  Neurological:     Mental Status: She is alert.  Psychiatric:        Mood and Affect: Mood normal.        Behavior: Behavior normal.        Thought Content: Thought content normal.        Judgment: Judgment normal.    Vitals:   08/20/24 1325  BP: 100/70  Pulse: 88  SpO2: 99%     A/P: Polypharmacy - Medication Reconciliation - Medication list reviewed and updated.  Epigastric pain. Reviewed complicated multiple agent therapy for Treatment of H. pylori - Extensively discussed H. Pylori treatment regimen and how she can be adherent to regimen. - Pepto-bismol 262 mg tablets - 2 tablets four times daily - Tetracycline  500 mg tablets - 1 tablet four times daily - Metronidazole  250 mg tablets - 1 tablet four times daily - Pantoprazole  40 mg tablets - 1 tablet two times daily   Deferred back/shoulder pain with PCP, Dr. Alena on 9/25  Written patient instructions and updated medication list provided. Total time in face to face counseling 16 minutes.    PCP Clinic Visit  09/03/2024 Patient seen with Fonda Blase, PharmD Candidate - PY3 student and Estefana Blase, PharmD Candidate - PY4 student.

## 2024-08-20 NOTE — Assessment & Plan Note (Addendum)
 Polypharmacy - Medication Reconciliation - Medication list reviewed and updated.  Epigastric pain. Reviewed complicated multiple agent therapy for Treatment of H. pylori - Extensively discussed H. Pylori treatment regimen and how she can be adherent to regimen. - Pepto-bismol 262 mg tablets - 2 tablets four times daily - Tetracycline  500 mg tablets - 1 tablet four times daily - Metronidazole  250 mg tablets - 1 tablet four times daily - Pantoprazole  40 mg tablets - 1 tablet two times daily

## 2024-08-21 NOTE — Progress Notes (Signed)
 Reviewed and agree with Dr Rennis plan.

## 2024-08-22 ENCOUNTER — Ambulatory Visit (HOSPITAL_COMMUNITY)

## 2024-08-22 ENCOUNTER — Encounter (HOSPITAL_COMMUNITY): Payer: Self-pay | Admitting: Emergency Medicine

## 2024-08-22 ENCOUNTER — Ambulatory Visit (HOSPITAL_COMMUNITY)
Admission: EM | Admit: 2024-08-22 | Discharge: 2024-08-22 | Disposition: A | Discharge status: Home / Self Care | Attending: Emergency Medicine | Admitting: Emergency Medicine

## 2024-08-22 ENCOUNTER — Other Ambulatory Visit: Payer: Self-pay

## 2024-08-22 ENCOUNTER — Ambulatory Visit (INDEPENDENT_AMBULATORY_CARE_PROVIDER_SITE_OTHER)

## 2024-08-22 DIAGNOSIS — R0789 Other chest pain: Secondary | ICD-10-CM

## 2024-08-22 DIAGNOSIS — R051 Acute cough: Secondary | ICD-10-CM

## 2024-08-22 MED ORDER — BENZONATATE 100 MG PO CAPS: 100.0000 mg | ORAL_CAPSULE | Freq: Three times a day (TID) | ORAL | 0 refills | Status: DC

## 2024-08-22 NOTE — ED Triage Notes (Addendum)
 Pt reports had intermittent chest pains all week. Saw her doctor 9/11 for it. Pt also has cough that same amount of time. Pt reports had nose bleeds and her doctor told her very dry and to put Vaseline in there to help. Doctor did prescribed medications for acid reflux.

## 2024-08-22 NOTE — Discharge Instructions (Signed)
 I discussed the believe your chest pain is likely muscular related to the coughing that you have had for a month. You can take Tessalon  at every 8 hours as needed for cough.  You can also take over-the-counter (guaifenesin) Mucinex to help with cough. Alternate between 650 mg of Tylenol  and 400 mg of ibuprofen  every 6-8 hours as needed for pain as needed. Follow-up with your primary care provider or return here as needed. If you develop worsening chest pain, difficulty breathing, weakness, dizziness, or passing out please seek immediate medical treatment in the emergency department.

## 2024-08-22 NOTE — ED Provider Notes (Signed)
 [facility]    CSN: [id number] Arrival date & time: 08/22/24  1332      History   Chief Complaint Chief Complaint  Patient presents with   appt 130p    HPI Nancy Oconnor is a 38 y.o. female.   Patient presents with intermittent chest pain for 1 month.  Patient states that the chest pains have worsened over the last week.  Patient states that the pain usually occurs on the left upper side of her chest and sometimes radiates to her arms.  Patient denies any pain at this time.  Patient states that she has had a cough and nasal congestion for about a month as well.  Patient states that at night she feels like she has some shortness of breath when lying down to go to bed.  Patient denies shortness of breath during the day or shortness of breath with exertion.  Patient denies weakness, dizziness, nausea, and vomiting.  Patient was seen by her primary care provider on 9/11 and was diagnosed with epigastric pain at that time.  Patient states that she was prescribed medication for acid reflux at that time which she has been taking with some relief, but continues to have chest pain.  The history is provided by the patient and medical records.    Past Medical History:  Diagnosis Date   Abortion in first trimester 04/08/2023   Chest pain    limited exertion for past 4 days   Chills    Complicated UTI (urinary tract infection) 03/10/2015   Dry cough    2 years   Dyspnea    Dysuria 02/06/2022   Headache    Healthcare maintenance 09/20/2021   Hypocalcemia 09/20/2021   Noted on previous labs from 2018.    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/06/2022   Vaginal candidiasis 02/06/2022   Vaginal irritation 09/20/2022    Patient Active Problem List   Diagnosis Date Noted   Vaginal discharge 06/03/2024    Gastroenteritis 06/03/2024   Pelvic pain 10/09/2023   Emotional lability and hot flashes 10/09/2023   Epigastric pain 02/04/2023   Right lower quadrant abdominal pain 07/16/2022   Patellar tendonitis of both knees 09/20/2021    Past Surgical History:  Procedure Laterality Date   CESAREAN SECTION     IUD REMOVAL N/A 10/08/2016   Procedure: INTRAUTERINE DEVICE (IUD) REMOVAL;  Surgeon: Ted Rogena Solo, DO;  Location: [facility];  Service: Gynecology;  Laterality: N/A;   LAPAROSCOPY N/A 10/08/2016   Procedure: LAPAROSCOPY DIAGNOSTIC;  Surgeon: Ted Rogena Solo, DO;  Location: [facility];  Service: Gynecology;  Laterality: N/A;   LYSIS OF ADHESION N/A 10/08/2016   Procedure: LYSIS OF ADHESION;  Surgeon: Ted Rogena Solo, DO;  Location: [facility];  Service: Gynecology;  Laterality: N/A;    OB History     Gravida  4   Para  2   Term  2   Preterm      AB  1   Living         SAB      IAB  1   Ectopic      Multiple      Live Births  2            Home Medications    Prior to Admission medications  Medication Sig Start Date End Date Taking? Authorizing Provider  benzonatate  (TESSALON ) 100 MG capsule Take 1 capsule (100 mg total) by mouth every 8 (eight) hours. 08/22/24  Yes Johnie Flaming A, NP  Bismuth  Subsalicylate (PEPTO-BISMOL) 262 MG TABS Take 2 tablets (524 mg total) by mouth in the morning, at noon, in the evening, and at bedtime. 08/17/24   McDiarmid, Krystal BIRCH, MD  dicyclomine  (BENTYL ) 20 MG tablet Take 1 tablet (20 mg total) by mouth every 6 (six) hours. 08/11/24   Shitarev, Dimitry, MD  metroNIDAZOLE  (FLAGYL ) 250 MG tablet Take 1 tablet (250 mg total) by mouth 4 (four) times daily. 08/17/24   McDiarmid, Krystal BIRCH, MD  ondansetron  (ZOFRAN ) 4 MG tablet Take 1 tablet (4 mg total) by mouth every 8 (eight) hours as needed for nausea or vomiting. 06/01/24   Dameron, Marisa, DO  pantoprazole  (PROTONIX ) 40 MG tablet Take 1 tablet (40 mg total) by mouth 2 (two) times daily.  08/17/24   McDiarmid, Krystal BIRCH, MD  tetracycline  (SUMYCIN ) 500 MG capsule Take 1 capsule (500 mg total) by mouth 4 (four) times daily. 08/17/24   McDiarmid, Krystal BIRCH, MD    Family History Family History  Problem Relation Age of Onset   Hypertension Mother    Hypertension Father     Social History Social History   Tobacco Use   Smoking status: Never    Passive exposure: Never   Smokeless tobacco: Never  Substance Use Topics   Alcohol use: No   Drug use: No     Allergies   Other   Review of Systems Review of Systems  Per HPI  Physical Exam Triage Vital Signs ED Triage Vitals  Encounter Vitals Group     BP 08/22/24 1356 100/72     Girls Systolic BP Percentile --      Girls Diastolic BP Percentile --      Boys Systolic BP Percentile --      Boys Diastolic BP Percentile --      Pulse Rate 08/22/24 1356 93     Resp 08/22/24 1356 16     Temp 08/22/24 1356 98.1 F (36.7 C)     Temp Source 08/22/24 1356 Oral     SpO2 08/22/24 1356 98 %     Weight --      Height --      Head Circumference --      Peak Flow --      Pain Score 08/22/24 1354 6     Pain Loc --      Pain Education --      Exclude from Growth Chart --    No data found.  Updated Vital Signs BP 100/72   Pulse 93   Temp 98.1 F (36.7 C) (Oral)   Resp 16   LMP 08/07/2024 (Exact Date)   SpO2 98%   Visual Acuity Right Eye Distance:   Left Eye Distance:   Bilateral Distance:    Right Eye Near:   Left Eye Near:    Bilateral Near:     Physical Exam Vitals and nursing note reviewed.  Constitutional:      General: She is awake. She is not in acute distress.    Appearance: Normal appearance. She is well-developed and well-groomed. She is not ill-appearing.  HENT:     Right Ear: Tympanic membrane, ear canal and external ear normal.     Left Ear: Tympanic membrane, ear canal and external ear normal.     Nose: Congestion  present.     Mouth/Throat:     Mouth: Mucous membranes are moist.     Pharynx:  Posterior oropharyngeal erythema present. No oropharyngeal exudate.  Cardiovascular:     Rate and Rhythm: Normal rate and regular rhythm.     Heart sounds: Normal heart sounds.  Pulmonary:     Effort: Pulmonary effort is normal.     Breath sounds: Normal breath sounds.  Chest:     Chest wall: Tenderness present.    Abdominal:     General: Abdomen is flat. Bowel sounds are normal.     Palpations: Abdomen is soft.     Tenderness: There is no abdominal tenderness.  Skin:    General: Skin is warm and dry.  Neurological:     Mental Status: She is alert.  Psychiatric:        Behavior: Behavior is cooperative.      UC Treatments / Results  Labs (all labs ordered are listed, but only abnormal results are displayed) Labs Reviewed - No data to display  EKG   Radiology DG Chest 2 View Result Date: 08/22/2024 EXAM: 2 VIEW(S) XRAY OF THE CHEST 08/22/2024 02:54:41 PM COMPARISON: None available. CLINICAL HISTORY: Intermittent left sided chest pain and cough x1 month, worse over last week. Pt reports had intermittent chest pains all week. Saw her doctor 9/11 for it. Pt also has cough that same amount of time. Pt reports had nose bleeds and her doctor told her very dry and to put Vaseline in there to help. Doctor did prescribed medications for acid reflux. FINDINGS: LUNGS AND PLEURA: No focal pulmonary opacity. No pulmonary edema. No pleural effusion. No pneumothorax. HEART AND MEDIASTINUM: No acute abnormality of the cardiac and mediastinal silhouettes. BONES AND SOFT TISSUES: No acute osseous abnormality. ABDOMEN: Radiopaque material in bowel IMPRESSION: 1. No acute process. Electronically signed by: Dayne Hassell MD 08/22/2024 03:36 PM EDT RP Workstation: [id number]    Procedures Procedures (including critical care time)  Medications Ordered in UC Medications - No data to display  Initial Impression / Assessment and Plan / UC Course  I have reviewed the triage vital signs and the  nursing notes.  Pertinent labs & imaging results that were available during my care of the patient were reviewed by me and considered in my medical decision making (see chart for details).     Patient is overall well-appearing.  Vitals are stable.  EKG does not does not reveal ST elevation or acute cardiac findings.  Chest x-ray ordered.  Based on my interpretation there is no active cardiopulmonary disease.  Radiology report confirms this.  Chest pain likely muscular in nature due to reproducible upon palpation.  Recommended Tylenol  and ibuprofen  for this.  Prescribed Tessalon  to help with cough and discussed over-the-counter medications for this as well.  Discussed follow-up, return, and strict ER precautions. Final Clinical Impressions(s) / UC Diagnoses   Final diagnoses:  Chest wall pain  Acute cough     Discharge Instructions      I discussed the believe your chest pain is likely muscular related to the coughing that you have had for a month. You can take Tessalon  at every 8 hours as needed for cough.  You can also take over-the-counter (guaifenesin) Mucinex to help with cough. Alternate between 650 mg of Tylenol  and 400 mg of ibuprofen  every 6-8 hours as needed for pain as needed. Follow-up with your primary care provider or return here as needed. If you develop worsening chest pain, difficulty breathing,  weakness, dizziness, or passing out please seek immediate medical treatment in the emergency department.     ED Prescriptions     Medication Sig Dispense Auth. Provider   benzonatate  (TESSALON ) 100 MG capsule Take 1 capsule (100 mg total) by mouth every 8 (eight) hours. 21 capsule Johnie Flaming A, NP      PDMP not reviewed this encounter.   Johnie Flaming A, NP 08/22/24 1555

## 2024-09-03 ENCOUNTER — Ambulatory Visit (INDEPENDENT_AMBULATORY_CARE_PROVIDER_SITE_OTHER)

## 2024-09-03 VITALS — BP 99/86 | HR 83 | Ht 63.0 in | Wt 153.2 lb

## 2024-09-03 DIAGNOSIS — A048 Other specified bacterial intestinal infections: Secondary | ICD-10-CM

## 2024-09-03 DIAGNOSIS — R52 Pain, unspecified: Secondary | ICD-10-CM | POA: Diagnosis not present

## 2024-09-03 DIAGNOSIS — R200 Anesthesia of skin: Secondary | ICD-10-CM | POA: Diagnosis not present

## 2024-09-03 MED ORDER — MELOXICAM 15 MG PO TABS: 15.0000 mg | ORAL_TABLET | Freq: Every day | ORAL | 0 refills | Status: DC

## 2024-09-03 MED ORDER — PRENATAL VITAMIN 27-0.8 MG PO TABS: 1.0000 | ORAL_TABLET | Freq: Every day | ORAL | 3 refills | Status: DC

## 2024-09-03 MED ORDER — LIDOCAINE 5 % EX PTCH: 1.0000 | MEDICATED_PATCH | CUTANEOUS | 0 refills | Status: DC

## 2024-09-03 NOTE — Progress Notes (Signed)
 SUBJECTIVE:   CHIEF COMPLAINT / HPI: Pain in her chest, neck, legs, back  Pain She has been having headaches in the past several weeks, neck pain for years, and back pain for 1 week, left worse than right leg pain for 1 day, and chest/shoulder pain for 2 months now. Chest/shoulder pain along the anterior clavicle. Left leg pain originating in the left gluteal region radiating through the posterior aspect of the leg into the entirety of the foot She feels weak all over, but no localized weakness.  She reports no numbness or tingling.  No urinary or bowel incontinence. She does mention occasional abdominal bloating and inability to defecate for the last 3 days. No recent injuries or traumas reported. Saw Dr. Koval on 08/20/24 for treatment of her h pylori infection at which time she noted neck and shoulder pain that had been ongoing for one month.  Went to urgent care on 08/22/24 for chest pain that was thought to be musculoskeletal in nature.  H pylori treatment She has reports she has been able to take her medications as prescribed (see below): - Pepto-bismol 262 mg tablets - 2 tablets four times daily - Tetracycline  500 mg tablets - 1 tablet four times daily - Metronidazole  250 mg tablets - 1 tablet four times daily - Pantoprazole  40 mg tablets - 1 tablet two times daily  However, she is unable to name the medications from recall and dose not have them with her.  She thinks she has 1 to 2 days left of the antibiotics.  Hair loss She briefly notes hair loss at the end of the visit that has been bothersome for her  Last menstrual period started 08/27/24  PERTINENT  PMH / PSH: H. pylori infection, pelvic pain  OBJECTIVE:   BP 99/86   Pulse 83   Ht 5' 3 (1.6 m)   Wt 153 lb 3.2 oz (69.5 kg)   SpO2 99%   BMI 27.14 kg/m    General: Overall well-appearing female in no acute distress, however appearing intermittently fatigued during the exam Cardiovascular: Regular  rate Respiratory: Breathing comfortably on room air Neuro: Cranial nerves II through XII intact, no nystagmus, normal finger-to-nose testing, no pronator drift.  Patient reports diffusely decreased sensation in both the left upper and lower extremity.  Sensation difference is nondermatomal. MSK: Deltoid biceps triceps and grip strength 5 out of 5 and symmetric bilaterally.  Hip flexion, knee extension, knee flexion, dorsiflexion and plantarflexion 5 out of 5 and symmetric bilaterally.  Bilateral straight leg raise is negative.  Active range of motion of the cervical spine and shoulders are full.  Negative Spurling's bilaterally.  No reproducible tenderness to palpation.  ASSESSMENT/PLAN:   Assessment & Plan Acute generalized body pain Numbness Vital signs stable. Patient with normal neurologic exam aside from subjective nondermatomal decreased sensation in the left upper and lower extremity and no significant findings pointing to an MSK etiology for pain.  Pain may be due to to somatic conversion of other symptoms.  Will schedule close follow-up to continue to assess underlying history and diagnoses. Prescribed prenatal vitamin every day for nerve health Prescribed Meloxicam  15 mg once a day for two weeks. Prescribed lidocaine  patches to be applied to areas of most pain. Can take OTC acetaminophen  1000 mg up to four times a day as needed for pain. Ordered vitamin B12 and vitamin D  levels today.  Follow up in 2 weeks H. pylori infection She will complete the last two days of  her antibiotic course then stop taking the pepto-bismol and the pantoprazole  - Tetracycline  500 mg tablets - 1 tablet four times daily - Metronidazole  250 mg tablets - 1 tablet four times daily - Pepto-bismol 262 mg tablets - 2 tablets four times daily - Pantoprazole  40 mg tablets - 1 tablet two times daily  At two week follow up,  we will discuss h pylori TOC (once off PPI for two weeks)  She briefly mentioned hair loss  which we can discuss at the next visit.   Kansas Spainhower Alena Morrison, MD [facility]

## 2024-09-03 NOTE — Patient Instructions (Addendum)
 For your pain -  Please take 1 prenatal vitamin every day. Take Meloxicam  (also called mobic ) 15 mg once a day for two weeks. You can wear up to three lidocaine  patches at a time. Apply these to the areas where the pain is the worst. You can take acetaminophen  1000 mg up to four times a day as needed for pain. I am checking your vitamin B12 and vitamin D  levels today.  I will see you back in two weeks and we will discuss the lab values and further treatment.  For your h pylori stomach infection -  When the tetracycline  and metronidazole  pills run out, stop taking the pepto-bismol and the pantoprazole  - Tetracycline  500 mg tablets - 1 tablet four times daily - Metronidazole  250 mg tablets - 1 tablet four times daily - Pepto-bismol 262 mg tablets - 2 tablets four times daily - Pantoprazole  40 mg tablets - 1 tablet two times daily  When I see you back in 2 weeks, we will discuss repeating your h pylori testing to see if this infection is gone.   When we meet again in October, we can further discuss your hair loss. It was nice to meet you today, Elio Art, MD

## 2024-09-04 ENCOUNTER — Telehealth: Payer: Self-pay

## 2024-09-04 ENCOUNTER — Other Ambulatory Visit (HOSPITAL_COMMUNITY): Payer: Self-pay

## 2024-09-04 ENCOUNTER — Ambulatory Visit: Payer: Self-pay

## 2024-09-04 LAB — VITAMIN B12: Vitamin B-12: 279 pg/mL (ref 232–1245)

## 2024-09-04 LAB — VITAMIN D 25 HYDROXY (VIT D DEFICIENCY, FRACTURES): Vit D, 25-Hydroxy: 15.8 ng/mL — ABNORMAL LOW (ref 30.0–100.0)

## 2024-09-04 NOTE — Telephone Encounter (Signed)
 Prior authorization submitted for LIDOCAINE  5% PATCHES to [employer] via Lat[employer].   Key: Nancy Oconnor

## 2024-09-07 NOTE — Telephone Encounter (Signed)
 Pharmacy Patient Advocate Encounter  Received notification from [facility] [employer] that Prior Authorization for LIDOCAINE  5% PATCHES has been DENIED.  Full denial letter will be uploaded to the media tab. See denial reason below.    PA #/Case ID/Reference #: [id number]

## 2024-09-17 ENCOUNTER — Other Ambulatory Visit (HOSPITAL_COMMUNITY): Admission: RE | Admit: 2024-09-17 | Discharge: 2024-09-17 | Disposition: A | Source: Ambulatory Visit

## 2024-09-17 ENCOUNTER — Ambulatory Visit (INDEPENDENT_AMBULATORY_CARE_PROVIDER_SITE_OTHER): Payer: Self-pay

## 2024-09-17 VITALS — BP 107/68 | HR 81 | Wt 155.6 lb

## 2024-09-17 DIAGNOSIS — M7918 Myalgia, other site: Secondary | ICD-10-CM | POA: Diagnosis not present

## 2024-09-17 DIAGNOSIS — R1024 Suprapubic pain: Secondary | ICD-10-CM

## 2024-09-17 DIAGNOSIS — N939 Abnormal uterine and vaginal bleeding, unspecified: Secondary | ICD-10-CM

## 2024-09-17 DIAGNOSIS — A048 Other specified bacterial intestinal infections: Secondary | ICD-10-CM

## 2024-09-17 DIAGNOSIS — H6120 Impacted cerumen, unspecified ear: Secondary | ICD-10-CM

## 2024-09-17 DIAGNOSIS — G8929 Other chronic pain: Secondary | ICD-10-CM

## 2024-09-17 DIAGNOSIS — N898 Other specified noninflammatory disorders of vagina: Secondary | ICD-10-CM | POA: Diagnosis present

## 2024-09-17 DIAGNOSIS — E559 Vitamin D deficiency, unspecified: Secondary | ICD-10-CM

## 2024-09-17 LAB — POCT WET PREP (WET MOUNT): Trichomonas Wet Prep HPF POC: ABSENT

## 2024-09-17 LAB — POCT URINALYSIS DIP (MANUAL ENTRY)
Bilirubin, UA: NEGATIVE
Glucose, UA: NEGATIVE mg/dL
Ketones, POC UA: NEGATIVE mg/dL
Leukocytes, UA: NEGATIVE
Nitrite, UA: NEGATIVE
Protein Ur, POC: NEGATIVE mg/dL
Spec Grav, UA: 1.01 (ref 1.010–1.025)
Urobilinogen, UA: 0.2 U/dL
pH, UA: 6.5 (ref 5.0–8.0)

## 2024-09-17 MED ORDER — DULOXETINE HCL 30 MG PO CPEP: 30.0000 mg | ORAL_CAPSULE | Freq: Every day | ORAL | 1 refills | Status: AC

## 2024-09-17 NOTE — Progress Notes (Signed)
 SUBJECTIVE:   CHIEF COMPLAINT / HPI:   She reports left gluteal pain for the last week.  No numbness or tingling.  She reports small chronic urinary incontinence, but no recent increase in incontinence where she has fully emptied her bladder and herself without awareness.  No fecal incontinence.  She did have this complaint when I saw her September 25 at which time I prescribed lidocaine  patches and meloxicam .  She does note that use of these have caused some improvement of her symptoms.  The first day of her last menstrual period was September 18.  She notes that Tuesday, 10/7, she started having dark brown bloody vaginal discharge that was scant.  She does note some vulvar itching.  No other discharge.  She is not sexually active.  This bleeding is very distressing to her.  She has noted occasional periods that are only 3 weeks apart in the past.  She also notes suprapubic and left lower quadrant pain.  She says she probably has blood in her urine.  Unclear whether she has dysuria or increased urinary frequency as the entirety of her history is somewhat difficult to obtain clearly.  Additionally, she reports bilateral ear pain that is causing her trouble sleeping at night.  She also mentions dizziness in her ears.  No episodes of syncope, near syncope, falls.  PERTINENT  PMH / PSH: Multiple episodes of pain including right lower quadrant pain, epigastric pain, pelvic pain  OBJECTIVE:   BP 107/68   Pulse 81   Wt 155 lb 9.6 oz (70.6 kg)   LMP 08/27/2024   SpO2 100%   BMI 27.56 kg/m    General well-appearing woman Lumbar exam: Forward flexion and extension are somewhat limited due to pain, negative straight leg raise, positive tenderness to palpation of the left gluteal region Psych: Speech is scattered and not goal oriented Abdomen: Soft, significant tenderness through the lower abdomen, no suprapubic fullness Ears: Mild cerumen bilaterally, tympanic membranes are pearly without any  signs of bulging Pelvic exam chaperoned by Cassell Mary, CMA: No pelvic lymphadenopathy, no erythema of the vulva, scant dried blood visualized throughout the vaginal vault, no other discharge noted, vaginal tissue is pink and without lesions, bimanual exam with significant pain, no palpable lesions or adnexal masses  ASSESSMENT/PLAN:   Assessment & Plan Abnormal uterine bleeding Vaginal itching Given her concurrent itching, Wet prep examined today without any signs of yeast, BV, trichomonas. GC ordered and pending.  However, given her exam I am less inclined to think that her bleeding is from an infectious process. Based on exam, I am more inclined to think that her bleeding is an early menstrual period.  We discussed that menstrual periods can become more irregular as women near the age of menopause. I asked her to make a follow-up with me in a month to reevaluate her bleeding. Suprapubic pain Suprapubic pain with reports of incontinence and possibly hematuria may represent a UTI UA collected with small blood consistent with her uterine bleeding.  Otherwise no evidence of UTI. Gluteal pain Other chronic pain Myofascial pain While she reports that the gluteal pain is new in the last week, this is also the same pain that we discussed at her last visit in September.  Based on exam, this does not appear to have a neurologic component.  I suspect her gluteal pain is myofascial. She did have some relief with lidocaine  patches and meloxicam ; however, I would like to avoid persistent NSAID use given her recent  history of H. pylori infection. Based on multiple somatic pain complaints, prescribed Cymbalta 30 mg nightly today.  Discussed this may cause some sedation. Recommended continuing lidocaine  patches.  Provided a referral to physical therapy for for her myofascial pain. Cerumen in auditory canal on examination We did not have time to do a full history and assessment of her ear pain.  Her exam  was nonconcerning for infection or tumor.  She made an appointment for tomorrow for further evaluation of this H. pylori infection She is uncertain if she finished her H. pylori meds and she reported today that she is still taking her PPI after recommendation at the last visit to discontinue PPI. Unable to schedule TOC for H. pylori today as she is still continued PPI.  Vitamin D  deficiency Based on labs collected at her last visit I made recommendations for vitamin D  supplementation and also recommended a prenatal vitamin.  She has not yet started taking these.     Neira Bentsen Alena Morrison, MD [facility]

## 2024-09-17 NOTE — Patient Instructions (Signed)
 For your low back and leg pain, I would recommend applying the lidocaine  patches you have at home.  I have referred you to physical therapy to help with your pain.  Additionally I have started a medication called Cymbalta 30 mg which you will take once every night.  This will hopefully help with your pain.  For your bleeding, please come back next month so that we can see how this is going.  I will call you if you have an infection that needs to be treated either in your vagina or in your bladder.  The infection test that we did in the office did not show any infections.  Please make an appointment at the front desk to have your ears evaluated tomorrow.  I did not see any signs of infection today.

## 2024-09-18 ENCOUNTER — Encounter (HOSPITAL_BASED_OUTPATIENT_CLINIC_OR_DEPARTMENT_OTHER): Payer: Self-pay

## 2024-09-18 ENCOUNTER — Inpatient Hospital Stay (HOSPITAL_COMMUNITY): Admitting: Anesthesiology

## 2024-09-18 ENCOUNTER — Emergency Department (HOSPITAL_BASED_OUTPATIENT_CLINIC_OR_DEPARTMENT_OTHER)

## 2024-09-18 ENCOUNTER — Other Ambulatory Visit: Payer: Self-pay

## 2024-09-18 ENCOUNTER — Ambulatory Visit

## 2024-09-18 ENCOUNTER — Encounter (HOSPITAL_COMMUNITY)
Admission: EM | Disposition: A | Discharge status: Home / Self Care | Payer: Self-pay | Source: Home / Self Care | Attending: Obstetrics and Gynecology

## 2024-09-18 ENCOUNTER — Ambulatory Visit (HOSPITAL_BASED_OUTPATIENT_CLINIC_OR_DEPARTMENT_OTHER)
Admission: EM | Admit: 2024-09-18 | Discharge: 2024-09-18 | Disposition: A | Discharge status: Home / Self Care | Attending: Obstetrics & Gynecology | Admitting: Obstetrics & Gynecology

## 2024-09-18 DIAGNOSIS — O009 Unspecified ectopic pregnancy without intrauterine pregnancy: Secondary | ICD-10-CM | POA: Diagnosis present

## 2024-09-18 DIAGNOSIS — Z3A01 Less than 8 weeks gestation of pregnancy: Secondary | ICD-10-CM | POA: Diagnosis not present

## 2024-09-18 DIAGNOSIS — O00102 Left tubal pregnancy without intrauterine pregnancy: Secondary | ICD-10-CM

## 2024-09-18 DIAGNOSIS — Z3A00 Weeks of gestation of pregnancy not specified: Secondary | ICD-10-CM | POA: Diagnosis not present

## 2024-09-18 DIAGNOSIS — K661 Hemoperitoneum: Secondary | ICD-10-CM

## 2024-09-18 DIAGNOSIS — Z743 Need for continuous supervision: Secondary | ICD-10-CM | POA: Diagnosis not present

## 2024-09-18 DIAGNOSIS — R1084 Generalized abdominal pain: Secondary | ICD-10-CM | POA: Diagnosis not present

## 2024-09-18 HISTORY — PX: DIAGNOSTIC LAPAROSCOPY WITH REMOVAL OF ECTOPIC PREGNANCY: SHX6449

## 2024-09-18 LAB — URINALYSIS, ROUTINE W REFLEX MICROSCOPIC
Bilirubin Urine: NEGATIVE
Glucose, UA: NEGATIVE mg/dL
Ketones, ur: NEGATIVE mg/dL
Nitrite: NEGATIVE
Protein, ur: 30 mg/dL — AB
Specific Gravity, Urine: 1.017 (ref 1.005–1.030)
pH: 6 (ref 5.0–8.0)

## 2024-09-18 LAB — COMPREHENSIVE METABOLIC PANEL WITH GFR
ALT: 8 U/L (ref 0–44)
AST: 18 U/L (ref 15–41)
Albumin: 4.3 g/dL (ref 3.5–5.0)
Alkaline Phosphatase: 71 U/L (ref 38–126)
Anion gap: 11 (ref 5–15)
BUN: <5 mg/dL — ABNORMAL LOW (ref 6–20)
CO2: 22 mmol/L (ref 22–32)
Calcium: 9 mg/dL (ref 8.9–10.3)
Chloride: 103 mmol/L (ref 98–111)
Creatinine, Ser: 0.61 mg/dL (ref 0.44–1.00)
GFR, Estimated: >60 mL/min (ref >60)
Glucose, Bld: 94 mg/dL (ref 70–99)
Potassium: 3.7 mmol/L (ref 3.5–5.1)
Sodium: 136 mmol/L (ref 135–145)
Total Bilirubin: 0.6 mg/dL (ref 0.0–1.2)
Total Protein: 7.3 g/dL (ref 6.5–8.1)

## 2024-09-18 LAB — CBC
HCT: 39.3 % (ref 36.0–46.0)
Hemoglobin: 12.8 g/dL (ref 12.0–15.0)
MCH: 28.4 pg (ref 26.0–34.0)
MCHC: 32.6 g/dL (ref 30.0–36.0)
MCV: 87.3 fL (ref 80.0–100.0)
Platelets: 234 K/uL (ref 150–400)
RBC: 4.5 MIL/uL (ref 3.87–5.11)
RDW: 13.1 % (ref 11.5–15.5)
WBC: 8.9 K/uL (ref 4.0–10.5)
nRBC: 0 % (ref 0.0–0.2)

## 2024-09-18 LAB — LIPASE, BLOOD: Lipase: 28 U/L (ref 11–51)

## 2024-09-18 LAB — PREGNANCY, URINE: Preg Test, Ur: POSITIVE — AB

## 2024-09-18 LAB — CBC WITH DIFFERENTIAL/PLATELET
Abs Immature Granulocytes: 0.02 K/uL (ref 0.00–0.07)
Basophils Absolute: 0 K/uL (ref 0.0–0.1)
Basophils Relative: 0 %
Eosinophils Absolute: 0.3 K/uL (ref 0.0–0.5)
Eosinophils Relative: 3 %
HCT: 40.4 % (ref 36.0–46.0)
Hemoglobin: 13 g/dL (ref 12.0–15.0)
Immature Granulocytes: 0 %
Lymphocytes Relative: 39 %
Lymphs Abs: 3.6 K/uL (ref 0.7–4.0)
MCH: 28.1 pg (ref 26.0–34.0)
MCHC: 32.2 g/dL (ref 30.0–36.0)
MCV: 87.3 fL (ref 80.0–100.0)
Monocytes Absolute: 1 K/uL (ref 0.1–1.0)
Monocytes Relative: 10 %
Neutro Abs: 4.4 K/uL (ref 1.7–7.7)
Neutrophils Relative %: 48 %
Platelets: 234 K/uL (ref 150–400)
RBC: 4.63 MIL/uL (ref 3.87–5.11)
RDW: 13.3 % (ref 11.5–15.5)
WBC: 9.3 K/uL (ref 4.0–10.5)
nRBC: 0 % (ref 0.0–0.2)

## 2024-09-18 LAB — TYPE AND SCREEN
ABO/RH(D): O POS
Antibody Screen: NEGATIVE

## 2024-09-18 LAB — HCG, QUANTITATIVE, PREGNANCY: hCG, Beta Chain, Quant, S: 335 m[IU]/mL — ABNORMAL HIGH (ref <5)

## 2024-09-18 SURGERY — LAPAROSCOPY, WITH ECTOPIC PREGNANCY SURGICAL TREATMENT
Anesthesia: General

## 2024-09-18 SURGERY — LAPAROSCOPY, WITH ECTOPIC PREGNANCY SURGICAL TREATMENT
Anesthesia: Choice

## 2024-09-18 MED ORDER — LIDOCAINE 2% (20 MG/ML) 5 ML SYRINGE
INTRAMUSCULAR | Status: AC
  Filled 2024-09-18: qty 5

## 2024-09-18 MED ORDER — PROPOFOL 500 MG/50ML IV EMUL
INTRAVENOUS | Status: DC | PRN
  Administered 2024-09-18: 130 ug/kg/min via INTRAVENOUS

## 2024-09-18 MED ORDER — ACETAMINOPHEN 10 MG/ML IV SOLN
INTRAVENOUS | Status: DC | PRN
  Administered 2024-09-18: 1000 mg via INTRAVENOUS

## 2024-09-18 MED ORDER — CEFAZOLIN SODIUM-DEXTROSE 2-3 GM-%(50ML) IV SOLR
INTRAVENOUS | Status: DC | PRN
Start: 2024-09-18 — End: 2024-09-18
  Administered 2024-09-18: 2 g via INTRAVENOUS

## 2024-09-18 MED ORDER — ROCURONIUM BROMIDE 10 MG/ML (PF) SYRINGE
PREFILLED_SYRINGE | INTRAVENOUS | Status: AC
  Filled 2024-09-18: qty 10

## 2024-09-18 MED ORDER — LIDOCAINE 2% (20 MG/ML) 5 ML SYRINGE
INTRAMUSCULAR | Status: DC | PRN
  Administered 2024-09-18: 40 mg via INTRAVENOUS

## 2024-09-18 MED ORDER — CHLORHEXIDINE GLUCONATE 0.12 % MT SOLN: 15.0000 mL | Freq: Once | OROMUCOSAL | Status: AC

## 2024-09-18 MED ORDER — MIDAZOLAM HCL 2 MG/2ML IJ SOLN
INTRAMUSCULAR | Status: AC
Start: 2024-09-18 — End: 2024-09-18
  Filled 2024-09-18: qty 2

## 2024-09-18 MED ORDER — EPHEDRINE 5 MG/ML INJ
INTRAVENOUS | Status: AC
  Filled 2024-09-18: qty 5

## 2024-09-18 MED ORDER — KETOROLAC TROMETHAMINE 10 MG PO TABS: 10.0000 mg | ORAL_TABLET | Freq: Three times a day (TID) | ORAL | 0 refills | Status: DC | PRN

## 2024-09-18 MED ORDER — BUPIVACAINE HCL (PF) 0.25 % IJ SOLN
INTRAMUSCULAR | Status: DC | PRN
  Administered 2024-09-18: 45 mL

## 2024-09-18 MED ORDER — ACETAMINOPHEN 325 MG PO TABS
650.0000 mg | ORAL_TABLET | ORAL | Status: AC | PRN
  Administered 2024-09-18: 650 mg via ORAL
  Filled 2024-09-18: qty 2

## 2024-09-18 MED ORDER — SUGAMMADEX SODIUM 200 MG/2ML IV SOLN
INTRAVENOUS | Status: DC | PRN
Start: 2024-09-18 — End: 2024-09-18
  Administered 2024-09-18: 200 mg via INTRAVENOUS

## 2024-09-18 MED ORDER — AMISULPRIDE (ANTIEMETIC) 5 MG/2ML IV SOLN
INTRAVENOUS | Status: AC
  Filled 2024-09-18: qty 4

## 2024-09-18 MED ORDER — SUCCINYLCHOLINE CHLORIDE 200 MG/10ML IV SOSY
PREFILLED_SYRINGE | INTRAVENOUS | Status: AC
Start: 2024-09-18 — End: 2024-09-18
  Filled 2024-09-18: qty 10

## 2024-09-18 MED ORDER — FENTANYL CITRATE (PF) 50 MCG/ML IJ SOSY
50.0000 ug | PREFILLED_SYRINGE | Freq: Once | INTRAMUSCULAR | Status: AC
  Administered 2024-09-18: 50 ug via INTRAVENOUS
  Filled 2024-09-18: qty 1

## 2024-09-18 MED ORDER — POVIDONE-IODINE 10 % EX SWAB
2.0000 | Freq: Once | CUTANEOUS | Status: AC
  Administered 2024-09-18: 2 via TOPICAL

## 2024-09-18 MED ORDER — PHENYLEPHRINE HCL-NACL 20-0.9 MG/250ML-% IV SOLN
INTRAVENOUS | Status: DC | PRN
  Administered 2024-09-18: 25 ug/min via INTRAVENOUS

## 2024-09-18 MED ORDER — FENTANYL CITRATE (PF) 250 MCG/5ML IJ SOLN
INTRAMUSCULAR | Status: DC | PRN
  Administered 2024-09-18: 100 ug via INTRAVENOUS
  Administered 2024-09-18: 50 ug via INTRAVENOUS

## 2024-09-18 MED ORDER — CEFAZOLIN SODIUM 1 G IJ SOLR
INTRAMUSCULAR | Status: AC
  Filled 2024-09-18: qty 20

## 2024-09-18 MED ORDER — OXYCODONE HCL 5 MG/5ML PO SOLN: 5.0000 mg | Freq: Once | ORAL | Status: AC | PRN

## 2024-09-18 MED ORDER — BUPIVACAINE HCL (PF) 0.25 % IJ SOLN
INTRAMUSCULAR | Status: AC
  Filled 2024-09-18: qty 30

## 2024-09-18 MED ORDER — SCOPOLAMINE 1 MG/3DAYS TD PT72
1.0000 | MEDICATED_PATCH | TRANSDERMAL | Status: DC
  Administered 2024-09-18: 1 mg via TRANSDERMAL
  Filled 2024-09-18: qty 1

## 2024-09-18 MED ORDER — ACETAMINOPHEN 500 MG PO TABS: 1000.0000 mg | ORAL_TABLET | Freq: Once | ORAL | Status: DC

## 2024-09-18 MED ORDER — ORAL CARE MOUTH RINSE: 15.0000 mL | Freq: Once | OROMUCOSAL | Status: AC

## 2024-09-18 MED ORDER — LACTATED RINGERS IV SOLN: INTRAVENOUS | Status: DC

## 2024-09-18 MED ORDER — HYDROMORPHONE HCL 1 MG/ML IJ SOLN
0.5000 mg | Freq: Once | INTRAMUSCULAR | Status: AC
  Administered 2024-09-18: 0.5 mg via INTRAVENOUS
  Filled 2024-09-18: qty 1

## 2024-09-18 MED ORDER — AMISULPRIDE (ANTIEMETIC) 5 MG/2ML IV SOLN
10.0000 mg | Freq: Once | INTRAVENOUS | Status: AC
  Administered 2024-09-18: 10 mg via INTRAVENOUS

## 2024-09-18 MED ORDER — SCOPOLAMINE 1 MG/3DAYS TD PT72
MEDICATED_PATCH | TRANSDERMAL | Status: AC
  Filled 2024-09-18: qty 1

## 2024-09-18 MED ORDER — EPHEDRINE SULFATE-NACL 50-0.9 MG/10ML-% IV SOSY
PREFILLED_SYRINGE | INTRAVENOUS | Status: DC | PRN
  Administered 2024-09-18: 10 mg via INTRAVENOUS

## 2024-09-18 MED ORDER — MIDAZOLAM HCL 2 MG/2ML IJ SOLN
INTRAMUSCULAR | Status: DC | PRN
  Administered 2024-09-18: 1 mg via INTRAVENOUS

## 2024-09-18 MED ORDER — LACTATED RINGERS IV BOLUS
1000.0000 mL | Freq: Once | INTRAVENOUS | Status: AC
  Administered 2024-09-18: 1000 mL via INTRAVENOUS

## 2024-09-18 MED ORDER — DEXAMETHASONE SOD PHOSPHATE PF 10 MG/ML IJ SOLN
INTRAMUSCULAR | Status: DC | PRN
  Administered 2024-09-18: 10 mg via INTRAVENOUS

## 2024-09-18 MED ORDER — PHENYLEPHRINE 80 MCG/ML (10ML) SYRINGE FOR IV PUSH (FOR BLOOD PRESSURE SUPPORT)
PREFILLED_SYRINGE | INTRAVENOUS | Status: DC | PRN
  Administered 2024-09-18: 160 ug via INTRAVENOUS

## 2024-09-18 MED ORDER — HYDROMORPHONE HCL 1 MG/ML IJ SOLN
1.0000 mg | Freq: Once | INTRAMUSCULAR | Status: AC
  Administered 2024-09-18: 1 mg via INTRAVENOUS
  Filled 2024-09-18: qty 1

## 2024-09-18 MED ORDER — CHLORHEXIDINE GLUCONATE 0.12 % MT SOLN
OROMUCOSAL | Status: AC
  Administered 2024-09-18: 15 mL via OROMUCOSAL
  Filled 2024-09-18: qty 15

## 2024-09-18 MED ORDER — ONDANSETRON HCL 4 MG/2ML IJ SOLN
4.0000 mg | Freq: Once | INTRAMUSCULAR | Status: AC
  Administered 2024-09-18: 4 mg via INTRAVENOUS
  Filled 2024-09-18: qty 2

## 2024-09-18 MED ORDER — OXYCODONE-ACETAMINOPHEN 5-325 MG PO TABS: 1.0000 | ORAL_TABLET | Freq: Four times a day (QID) | ORAL | 0 refills | Status: DC | PRN

## 2024-09-18 MED ORDER — PHENYLEPHRINE 80 MCG/ML (10ML) SYRINGE FOR IV PUSH (FOR BLOOD PRESSURE SUPPORT)
PREFILLED_SYRINGE | INTRAVENOUS | Status: AC
  Filled 2024-09-18: qty 10

## 2024-09-18 MED ORDER — OXYCODONE HCL 5 MG PO TABS
5.0000 mg | ORAL_TABLET | Freq: Once | ORAL | Status: AC | PRN
  Administered 2024-09-18: 5 mg via ORAL

## 2024-09-18 MED ORDER — SODIUM CHLORIDE 0.9 % IR SOLN
Status: DC | PRN
  Administered 2024-09-18: 1000 mL

## 2024-09-18 MED ORDER — KETOROLAC TROMETHAMINE 30 MG/ML IJ SOLN
INTRAMUSCULAR | Status: DC | PRN
Start: 2024-09-18 — End: 2024-09-18
  Administered 2024-09-18: 30 mg via INTRAVENOUS

## 2024-09-18 MED ORDER — PROPOFOL 10 MG/ML IV BOLUS
INTRAVENOUS | Status: DC | PRN
  Administered 2024-09-18: 110 mg via INTRAVENOUS

## 2024-09-18 MED ORDER — FENTANYL CITRATE (PF) 100 MCG/2ML IJ SOLN: 25.0000 ug | INTRAMUSCULAR | Status: DC | PRN

## 2024-09-18 MED ORDER — ONDANSETRON HCL 4 MG/2ML IJ SOLN
INTRAMUSCULAR | Status: DC | PRN
  Administered 2024-09-18: 4 mg via INTRAVENOUS

## 2024-09-18 MED ORDER — ROCURONIUM BROMIDE 10 MG/ML (PF) SYRINGE
PREFILLED_SYRINGE | INTRAVENOUS | Status: DC | PRN
  Administered 2024-09-18: 50 mg via INTRAVENOUS

## 2024-09-18 MED ORDER — FENTANYL CITRATE (PF) 250 MCG/5ML IJ SOLN
INTRAMUSCULAR | Status: AC
  Filled 2024-09-18: qty 5

## 2024-09-18 MED ORDER — ACETAMINOPHEN 10 MG/ML IV SOLN: 1000.0000 mg | Freq: Once | INTRAVENOUS | Status: DC | PRN

## 2024-09-18 MED ORDER — ACETAMINOPHEN 10 MG/ML IV SOLN
INTRAVENOUS | Status: AC
  Filled 2024-09-18: qty 100

## 2024-09-18 MED ORDER — ONDANSETRON 8 MG PO TBDP: 8.0000 mg | ORAL_TABLET | Freq: Three times a day (TID) | ORAL | 0 refills | Status: DC | PRN

## 2024-09-18 MED ORDER — OXYCODONE HCL 5 MG PO TABS
ORAL_TABLET | ORAL | Status: AC
  Filled 2024-09-18: qty 1

## 2024-09-18 MED ORDER — PROPOFOL 10 MG/ML IV BOLUS
INTRAVENOUS | Status: AC
Start: 2024-09-18 — End: 2024-09-18
  Filled 2024-09-18: qty 20

## 2024-09-18 MED ORDER — ONDANSETRON HCL 4 MG/2ML IJ SOLN
INTRAMUSCULAR | Status: AC
  Filled 2024-09-18: qty 4

## 2024-09-18 MED ORDER — SUCCINYLCHOLINE CHLORIDE 200 MG/10ML IV SOSY
PREFILLED_SYRINGE | INTRAVENOUS | Status: DC | PRN
  Administered 2024-09-18: 120 mg via INTRAVENOUS

## 2024-09-18 SURGICAL SUPPLY — 28 items
DERMABOND ADVANCED .7 DNX12 (GAUZE/BANDAGES/DRESSINGS) ×1 IMPLANT
DRAPE SURG IRRIG POUCH 19X23 (DRAPES) ×1 IMPLANT
DRSG OPSITE POSTOP 3X4 (GAUZE/BANDAGES/DRESSINGS) IMPLANT
DURAPREP 26ML APPLICATOR (WOUND CARE) ×1 IMPLANT
ELECTRODE REM PT RTRN 9FT ADLT (ELECTROSURGICAL) IMPLANT
GLOVE BIOGEL PI IND STRL 6.5 (GLOVE) ×2 IMPLANT
GLOVE BIOGEL PI IND STRL 7.0 (GLOVE) ×2 IMPLANT
GLOVE SURG SS PI 6.5 STRL IVOR (GLOVE) ×1 IMPLANT
GOWN STRL REUS W/ TWL LRG LVL3 (GOWN DISPOSABLE) ×2 IMPLANT
IRRIGATION SUCT STRKRFLW 2 WTP (MISCELLANEOUS) IMPLANT
KIT PINK PAD W/HEAD ARM REST (MISCELLANEOUS) ×1 IMPLANT
KIT TURNOVER KIT B (KITS) ×1 IMPLANT
NDL INSUFFLATION 14GA 120MM (NEEDLE) IMPLANT
NEEDLE INSUFFLATION 14GA 120MM (NEEDLE) IMPLANT
PACK LAPAROSCOPY BASIN (CUSTOM PROCEDURE TRAY) ×1 IMPLANT
SET TUBE SMOKE EVAC HIGH FLOW (TUBING) ×1 IMPLANT
SHEARS HARMONIC 36 ACE (MISCELLANEOUS) IMPLANT
SLEEVE Z-THREAD 5X100MM (TROCAR) ×1 IMPLANT
SOLN 0.9% NACL 1000 ML (IV SOLUTION) ×1 IMPLANT
SOLN 0.9% NACL POUR BTL 1000ML (IV SOLUTION) ×1 IMPLANT
SUT MNCRL AB 4-0 PS2 18 (SUTURE) ×1 IMPLANT
SUT VICRYL 0 UR6 27IN ABS (SUTURE) ×2 IMPLANT
SYSTEM BAG RETRIEVAL 10MM (BASKET) IMPLANT
TOWEL GREEN STERILE FF (TOWEL DISPOSABLE) ×2 IMPLANT
TRAY FOLEY W/BAG SLVR 14FR (SET/KITS/TRAYS/PACK) ×1 IMPLANT
TROCAR BALLN 12MMX100 BLUNT (TROCAR) ×1 IMPLANT
TROCAR XCEL NON-BLD 5MMX100MML (ENDOMECHANICALS) ×1 IMPLANT
TROCAR Z-THREAD OPTICAL 5X100M (TROCAR) IMPLANT

## 2024-09-18 NOTE — Anesthesia Postprocedure Evaluation (Signed)
 Anesthesia Post Note  Patient: Nancy Oconnor  Procedure(s) Performed: LAPAROSCOPY, WITH ECTOPIC PREGNANCY SURGICAL TREATMENT     Patient location during evaluation: PACU Anesthesia Type: General Level of consciousness: awake and alert, oriented and patient cooperative Pain management: pain level controlled Vital Signs Assessment: post-procedure vital signs reviewed and stable Respiratory status: spontaneous breathing, nonlabored ventilation and respiratory function stable Cardiovascular status: blood pressure returned to baseline and stable : nausea relieved. Anesthetic complications: no   No notable events documented.  Last Vitals:  Vitals:   09/18/24 2000 09/18/24 2010  BP: 99/61 100/71  Pulse: 74 65  Resp: 13 13  Temp:  (!) 36.4 C  SpO2: 97% 97%    Last Pain:  Vitals:   09/18/24 2007  TempSrc:   PainSc: 4                  Kaveri Perras,E. Latronda Spink

## 2024-09-18 NOTE — ED Provider Notes (Signed)
 [place] EMERGENCY DEPARTMENT AT [facility] Provider Note   CSN: [id number] Arrival date & time: 09/18/24  1028     Patient presents with: Flank Pain   Nancy Oconnor is a 38 y.o. female with a significant past medical history presents with concern for left lower quadrant pain and left-sided back pain that began yesterday.  She reports that the pain is constant in nature.  She also reports dysuria, but denies any hematuria or increased urinary frequency over the past couple days. She reports associated nausea, but no vomiting, fevers, or chills.  Reports normal bowel movements with her last bowel movement being yesterday.  She denies any abnormal vaginal discharge, but does report that on 10/7 she had some scant dark brown vaginal discharge.  Reports her last menstrual period was on September 18.  Wet prep performed at her PCP office yesterday was negative for yeast, BV, trichomonas.     Flank Pain       Prior to Admission medications   Medication Sig Start Date End Date Taking? Authorizing Provider  benzonatate  (TESSALON ) 100 MG capsule Take 1 capsule (100 mg total) by mouth every 8 (eight) hours. 08/22/24   Johnie Flaming A, NP  Bismuth  Subsalicylate (PEPTO-BISMOL) 262 MG TABS Take 2 tablets (524 mg total) by mouth in the morning, at noon, in the evening, and at bedtime. 08/17/24   McDiarmid, Krystal BIRCH, MD  dicyclomine  (BENTYL ) 20 MG tablet Take 1 tablet (20 mg total) by mouth every 6 (six) hours. 08/11/24   Shitarev, Dimitry, MD  DULoxetine (CYMBALTA) 30 MG capsule Take 1 capsule (30 mg total) by mouth at bedtime. 09/17/24   Alena Morrison, Reagan, MD  lidocaine  (LIDODERM ) 5 % Place 1 patch onto the skin daily. Remove & Discard patch within 12 hours or as directed by MD 09/03/24   Alena Morrison, Reagan, MD  meloxicam  (MOBIC ) 15 MG tablet Take 1 tablet (15 mg total) by mouth daily. 09/03/24   Alena Morrison, Reagan, MD  metroNIDAZOLE  (FLAGYL ) 250 MG tablet Take 1 tablet  (250 mg total) by mouth 4 (four) times daily. 08/17/24   McDiarmid, Krystal BIRCH, MD  ondansetron  (ZOFRAN ) 4 MG tablet Take 1 tablet (4 mg total) by mouth every 8 (eight) hours as needed for nausea or vomiting. 06/01/24   Dameron, Marisa, DO  pantoprazole  (PROTONIX ) 40 MG tablet Take 1 tablet (40 mg total) by mouth 2 (two) times daily. 08/17/24   McDiarmid, Krystal BIRCH, MD  Prenatal Vit-Fe Fumarate-FA (PRENATAL VITAMIN) 27-0.8 MG TABS Take 1 tablet by mouth daily. Patient not taking: Reported on 09/17/2024 09/03/24   Alena Morrison, Reagan, MD  tetracycline  (SUMYCIN ) 500 MG capsule Take 1 capsule (500 mg total) by mouth 4 (four) times daily. 08/17/24   McDiarmid, Krystal BIRCH, MD    Allergies: Other    Review of Systems  Genitourinary:  Positive for flank pain.    Updated Vital Signs BP (!) 128/91   Pulse 90   Temp 98.4 F (36.9 C) (Oral)   Resp 18   LMP 08/27/2024   SpO2 100%   Physical Exam Vitals and nursing note reviewed.  Constitutional:      General: She is not in acute distress.    Appearance: She is well-developed.     Comments: Appears uncomfortable, laying on her side curled up  HENT:     Head: Normocephalic and atraumatic.  Eyes:     Conjunctiva/sclera: Conjunctivae normal.  Cardiovascular:     Rate and Rhythm: Normal rate and  regular rhythm.     Heart sounds: No murmur heard. Pulmonary:     Effort: Pulmonary effort is normal. No respiratory distress.     Breath sounds: Normal breath sounds.  Abdominal:     Palpations: Abdomen is soft.     Tenderness: There is abdominal tenderness.     Comments: Tender to palpation in the left lower quadrant and left upper quadrant.  Nontender in the right upper quadrant and right lower quadrant.  Musculoskeletal:        General: No swelling.       Arms:     Cervical back: Neck supple.     Comments: Tender to the musculature of the lower back and bilaterally and left gluteal musculature  Skin:    General: Skin is warm and dry.     Capillary  Refill: Capillary refill takes less than 2 seconds.  Neurological:     Mental Status: She is alert.  Psychiatric:        Mood and Affect: Mood normal.     (all labs ordered are listed, but only abnormal results are displayed) Labs Reviewed  URINALYSIS, ROUTINE W REFLEX MICROSCOPIC - Abnormal; Notable for the following components:      Result Value   Color, Urine ORANGE (*)    APPearance HAZY (*)    Hgb urine dipstick LARGE (*)    Protein, ur 30 (*)    Leukocytes,Ua SMALL (*)    Bacteria, UA RARE (*)    All other components within normal limits  PREGNANCY, URINE - Abnormal; Notable for the following components:   Preg Test, Ur POSITIVE (*)    All other components within normal limits  COMPREHENSIVE METABOLIC PANEL WITH GFR - Abnormal; Notable for the following components:   BUN <5 (*)    All other components within normal limits  HCG, QUANTITATIVE, PREGNANCY - Abnormal; Notable for the following components:   hCG, Beta Chain, Quant, S 335 (*)    All other components within normal limits  CBC WITH DIFFERENTIAL/PLATELET  LIPASE, BLOOD    EKG: None  Radiology: US  Renal Result Date: 09/18/2024 CLINICAL DATA:  Left flank pain with microscopic hematuria. EXAM: RENAL / URINARY TRACT ULTRASOUND COMPLETE COMPARISON:  None Available. FINDINGS: Right Kidney: Renal measurements: 12.1 cm x 4.3 cm x 3.6 cm = volume: 96.9 mL. Echogenicity within normal limits. No mass or hydronephrosis visualized. Left Kidney: Renal measurements: 10.9 cm x 5.2 cm x 4.4 cm = volume: 13.2 mL. Echogenicity within normal limits. No mass or hydronephrosis visualized. Bladder: Appears normal for degree of bladder distention. Other: None. IMPRESSION: Unremarkable renal ultrasound. Electronically Signed   By: Suzen Dials M.D.   On: 09/18/2024 14:22   US  OB LESS THAN 14 WEEKS WITH OB TRANSVAGINAL Result Date: 09/18/2024 CLINICAL DATA:  Left lower quadrant pain EXAM: OBSTETRIC <14 WK US  AND TRANSVAGINAL OB US   TECHNIQUE: Both transabdominal and transvaginal ultrasound examinations were performed for complete evaluation of the gestation as well as the maternal uterus, adnexal regions, and pelvic cul-de-sac. Transvaginal technique was performed to assess early pregnancy. COMPARISON:  None Available. FINDINGS: Intrauterine gestational sac: None Yolk sac:  Not Visualized. Embryo:  Not Visualized. Subchorionic hemorrhage:  None visualized. Maternal uterus/adnexae: Heterogeneous hypoechogenicity within the endometrial canal. Thick-walled cystic structure in the left adnexa abutting the left ovaries measures 1.9 x 1.8 x 1.4 cm. Presumed right ovarian corpus luteum. Small volume pelvic free fluid. IMPRESSION: 1. Probable left adnexal ectopic pregnancy. 2. Heterogeneous echogenicity within the endometrial canal,  likely blood products. Critical Value/emergent results were called by telephone at the time of interpretation on 09/18/2024 at 1:55 p.m. to provider [facility] , who verbally acknowledged these results. Electronically Signed   By: Limin  Xu M.D.   On: 09/18/2024 14:17     Procedures   Medications Ordered in the ED  fentaNYL  (SUBLIMAZE ) injection 50 mcg (50 mcg Intravenous Given 09/18/24 1129)  ondansetron  (ZOFRAN ) injection 4 mg (4 mg Intravenous Given 09/18/24 1119)  acetaminophen  (TYLENOL ) tablet 650 mg (650 mg Oral Given 09/18/24 1210)  HYDROmorphone  (DILAUDID ) injection 0.5 mg (0.5 mg Intravenous Given 09/18/24 1442)    Clinical Course as of 09/18/24 1513  Fri Sep 18, 2024  1253 LMP 9/18 [AF]  1413 Spoke to OBGYN Dr. Alger at [facility], she recommends transfer to MAU for further management of likely left-sided ectopic pregnancy. [AF]    Clinical Course User Index [AF] Veta Palma, PA-C                                 Medical Decision Making Amount and/or Complexity of Data Reviewed Labs: ordered. Radiology: ordered.  Risk OTC drugs. Prescription drug  management.    Differential diagnosis includes but is not limited to  inevitable abortion, incomplete abortion, septic abortion, subchorionic hemorrhage, ectopic pregnancy, blood loss anemia, UTI, pyelonephritis, diverticulitis, menstrual bleeding, abnormal uterine bleeding  ED Course:  Upon initial evaluation, patient appears to be in some pain, crawled up and holding the left side of her abdomen.  She is tender to the left side of her abdomen.  Nontender over the right upper or right lower quadrant.  No active vomiting, but does report some nausea.  She is also tender over the musculature of the lower back bilaterally, as well as the left gluteal musculature.  Labs Ordered: I Ordered, and personally interpreted labs.  The pertinent results include:   CBC without leukocytosis, within normal limits CMP without any elevations in LFTs, creatinine, no electrolyte abnormalities Lipase within normal limits Urinalysis with small amount of leukocytes, red blood cells present.  Appears to be an unclean catch as there are squamous epithelial cells present. Beta-hCG 335  Imaging Studies ordered: I ordered imaging studies including transvaginal ultrasound, renal ultrasound I independently visualized the imaging with scope of interpretation limited to determining acute life threatening conditions related to emergency care. Imaging showed  Transvaginal ultrasound: IMPRESSION:  1. Probable left adnexal ectopic pregnancy.  2. Heterogeneous echogenicity within the endometrial canal, likely  blood products.   Renal ultrasound without acute abnormality I agree with the radiologist interpretation   Consultations Obtained: I requested consultation with the OB/GYN Dr. Alger,  and discussed lab and imaging findings as well as pertinent plan - they recommend: Transfer to MAU for further management of the left adnexal ectopic pregnancy  Medications Given: Dilaudid  Fentanyl  Tylenol   Upon  re-evaluation, patient reports pain is improved from the medications given here today.  Her ultrasound is concerning for a left adnexal ectopic pregnancy.  Patient also started have some vaginal bleeding while here in the emergency room.  I consulted with OB/GYN Dr. Alger at [id number], MAU, she recommends transfer to MAU for further management.  Patient remains hemodynamically stable and pain controlled.  The remainder of her workup is unremarkable with no elevations in LFTs, creatinine, lipase.  No leukocytosis, tachycardia, or fever to suggest systemic infection.  Renal ultrasound without evidence of nephrolithiasis. Her urinalysis does show red  blood cells and white blood cells, but also has squamous epithelial cells so likely unclean catch.  Unclear if she could have a overlying acute cystitis or pyelonephritis contributing to her symptoms as she did report dysuria.  However, probable left ectopic pregnancy is the most urgent matter at this time and she will be transferred to MAU for further management.  Impression: Probable left adnexal ectopic pregnancy  Disposition:  Patient transferred via [facility] to [facility] MAU unit with OBGYN Dr. Alger accepting Return precautions given.    Record Review: External records from outside source obtained and reviewed including PCP note/labs from yesterday where her wet prep swab was negative for yeast, trichomoniasis, and BV.  No pregnancy test was taken at that time     This chart was dictated using voice recognition software, Dragon. Despite the best efforts of this provider to proofread and correct errors, errors may still occur which can change documentation meaning.       Final diagnoses:  Ectopic pregnancy, unspecified location, unspecified whether intrauterine pregnancy present    ED Discharge Orders     None          Veta Palma, PA-C 09/18/24 1514    Levander Houston, MD 09/21/24 1205

## 2024-09-18 NOTE — MAU Note (Signed)
 Nancy Oconnor is a 38 y.o. at Unknown here in MAU reporting: patient seen at [facility] ER and found to have L. Ectopic pregnancy.   Pain score: 9/10 Vitals:   09/18/24 1432 09/18/24 1445  BP:  (!) 128/91  Pulse:  90  Resp:    Temp: 98.4 F (36.9 C)   SpO2:  100%

## 2024-09-18 NOTE — ED Notes (Signed)
 [facility] at bedside

## 2024-09-18 NOTE — MAU Note (Signed)
Patient to OR via stretcher.

## 2024-09-18 NOTE — Transfer of Care (Signed)
 Immediate Anesthesia Transfer of Care Note  Patient: Nancy Oconnor  Procedure(s) Performed: LAPAROSCOPY, WITH ECTOPIC PREGNANCY SURGICAL TREATMENT  Patient Location: PACU  Anesthesia Type:General  Level of Consciousness: drowsy  Airway & Oxygen Therapy: Patient Spontanous Breathing and Patient connected to face mask oxygen  Post-op Assessment: Report given to RN and Post -op Vital signs reviewed and stable  Post vital signs: Reviewed and stable  Last Vitals:  Vitals Value Taken Time  BP 101/60 09/18/24 19:32  Temp 36.4 C 09/18/24 19:32  Pulse 80 09/18/24 19:37  Resp 15 09/18/24 19:37  SpO2 97 % 09/18/24 19:37  Vitals shown include unfiled device data.  Last Pain:  Vitals:   09/18/24 1730  TempSrc:   PainSc: 8          Complications: No notable events documented.

## 2024-09-18 NOTE — ED Notes (Signed)
 Ferol w/ cl called for consult

## 2024-09-18 NOTE — Anesthesia Procedure Notes (Signed)
 Procedure Name: Intubation Date/Time: 09/18/2024 6:27 PM  Performed by: Mollie Olivia SAUNDERS, CRNAPre-anesthesia Checklist: Patient identified, Emergency Drugs available, Suction available, Patient being monitored and Timeout performed Patient Re-evaluated:Patient Re-evaluated prior to induction Oxygen Delivery Method: Circle system utilized Preoxygenation: Pre-oxygenation with 100% oxygen Induction Type: IV induction, Rapid sequence and Cricoid Pressure applied Laryngoscope Size: Glidescope and 3 Grade View: Grade I Tube type: Oral Tube size: 7.0 mm Number of attempts: 1 Airway Equipment and Method: Stylet Placement Confirmation: ETT inserted through vocal cords under direct vision, positive ETCO2 and breath sounds checked- equal and bilateral Secured at: 21 cm Tube secured with: Tape Dental Injury: Teeth and Oropharynx as per pre-operative assessment  Comments: Smooth IV Induction. Eyes taped. RSI Performed. DL x 1 with grade 1 view. Atraumatically placed, teeth and lip remain intact as pre-op. Secured with tape. Bilateral breath sounds +/=, EtCO2 +, Adequate TV, VSS.

## 2024-09-18 NOTE — H&P (Signed)
 Nancy Oconnor is an 38 y.o. female [phone] with LMP 08/27/24 transferred from [facility] ED for management of ectopic pregnancy. Patient reports onset of left lower quadrant pain radiating to her back since yesterday evening. She was seen by her PCP earlier today. As the pain worsened, she opted to be evaluated in the ED. Patient reports some brown discharge a few days prior to the onset of the pain along with nausea. No emesis.    Menstrual History: Patient's last menstrual period was 08/27/2024.    Past Medical History:  Diagnosis Date   Abortion in first trimester 04/08/2023   Chest pain    limited exertion for past 4 days   Chills    Complicated UTI (urinary tract infection) 03/10/2015   Dry cough    2 years   Dyspnea    Dysuria 02/06/2022   Headache    Healthcare maintenance 09/20/2021   Hypocalcemia 09/20/2021   Noted on previous labs from 2018.    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/06/2022   Vaginal candidiasis 02/06/2022   Vaginal irritation 09/20/2022    Past Surgical History:  Procedure Laterality Date   CESAREAN SECTION     IUD REMOVAL N/A 10/08/2016   Procedure: INTRAUTERINE DEVICE (IUD) REMOVAL;  Surgeon: Ted Rogena Solo, DO;  Location: [facility];  Service: Gynecology;  Laterality: N/A;   LAPAROSCOPY N/A 10/08/2016   Procedure: LAPAROSCOPY DIAGNOSTIC;  Surgeon: Ted Rogena Solo, DO;  Location: [facility];  Service: Gynecology;  Laterality: N/A;   LYSIS OF ADHESION N/A 10/08/2016   Procedure: LYSIS OF ADHESION;  Surgeon: Ted Rogena Solo, DO;  Location: [facility];  Service: Gynecology;  Laterality: N/A;    Family History  Problem Relation Age of Onset   Hypertension Mother    Hypertension Father     Social History:  reports that she has never smoked. She has never been exposed to tobacco  smoke. She has never used smokeless tobacco. She reports that she does not drink alcohol and does not use drugs.  Allergies:  Allergies  Allergen Reactions   Other Itching    Unknown pain medication     Medications Prior to Admission  Medication Sig Dispense Refill Last Dose/Taking   benzonatate  (TESSALON ) 100 MG capsule Take 1 capsule (100 mg total) by mouth every 8 (eight) hours. 21 capsule 0    Bismuth  Subsalicylate (PEPTO-BISMOL) 262 MG TABS Take 2 tablets (524 mg total) by mouth in the morning, at noon, in the evening, and at bedtime. 112 tablet 0    dicyclomine  (BENTYL ) 20 MG tablet Take 1 tablet (20 mg total) by mouth every 6 (six) hours. 120 tablet 0    DULoxetine (CYMBALTA) 30 MG capsule Take 1 capsule (30 mg total) by mouth at bedtime. 30 capsule 1    lidocaine  (LIDODERM ) 5 % Place 1 patch onto the skin daily. Remove & Discard patch within 12 hours or as directed by MD 30 patch 0    meloxicam  (MOBIC ) 15 MG tablet Take 1 tablet (15 mg total) by mouth daily. 14 tablet 0    metroNIDAZOLE  (FLAGYL ) 250 MG tablet Take 1 tablet (250 mg total) by mouth 4 (four) times daily. 56 tablet 0    ondansetron  (ZOFRAN ) 4 MG tablet Take 1 tablet (4 mg total) by mouth every 8 (eight) hours as  needed for nausea or vomiting. 20 tablet 0    pantoprazole  (PROTONIX ) 40 MG tablet Take 1 tablet (40 mg total) by mouth 2 (two) times daily. 60 tablet 0    Prenatal Vit-Fe Fumarate-FA (PRENATAL VITAMIN) 27-0.8 MG TABS Take 1 tablet by mouth daily. (Patient not taking: Reported on 09/17/2024) 30 tablet 3    tetracycline  (SUMYCIN ) 500 MG capsule Take 1 capsule (500 mg total) by mouth 4 (four) times daily. 56 capsule 0     Review of Systems See pertinent in HPI. All other systems reviewed and non contributory Blood pressure (!) 128/91, pulse 90, temperature 98.4 F (36.9 C), temperature source Oral, resp. rate 18, last menstrual period 08/27/2024, SpO2 100%, unknown if currently breastfeeding. Physical  Exam GENERAL: Well-developed, well-nourished female in mild distress.  LUNGS: Clear to auscultation bilaterally.  HEART: Regular rate and rhythm. ABDOMEN: Soft, diffuse tenderness on the left side of her abdomen, nondistended. Voluntary guarding PELVIC: Deferred to OR EXTREMITIES: No cyanosis, clubbing, or edema, 2+ distal pulses.  Results for orders placed or performed during the hospital encounter of 09/18/24 (from the past 24 hours)  Urinalysis, Routine w reflex microscopic -Urine, Clean Catch     Status: Abnormal   Collection Time: 09/18/24 10:39 AM  Result Value Ref Range   Color, Urine ORANGE (A) YELLOW   APPearance HAZY (A) CLEAR   Specific Gravity, Urine 1.017 1.005 - 1.030   pH 6.0 5.0 - 8.0   Glucose, UA NEGATIVE NEGATIVE mg/dL   Hgb urine dipstick LARGE (A) NEGATIVE   Bilirubin Urine NEGATIVE NEGATIVE   Ketones, ur NEGATIVE NEGATIVE mg/dL   Protein, ur 30 (A) NEGATIVE mg/dL   Nitrite NEGATIVE NEGATIVE   Leukocytes,Ua SMALL (A) NEGATIVE   RBC / HPF 21-50 0 - 5 RBC/hpf   WBC, UA 11-20 0 - 5 WBC/hpf   Bacteria, UA RARE (A) NONE SEEN   Squamous Epithelial / HPF 21-50 0 - 5 /HPF   Mucus PRESENT   Pregnancy, urine     Status: Abnormal   Collection Time: 09/18/24 10:39 AM  Result Value Ref Range   Preg Test, Ur POSITIVE (A) NEGATIVE  CBC with Differential     Status: None   Collection Time: 09/18/24 11:14 AM  Result Value Ref Range   WBC 9.3 4.0 - 10.5 K/uL   RBC 4.63 3.87 - 5.11 MIL/uL   Hemoglobin 13.0 12.0 - 15.0 g/dL   HCT 59.5 63.9 - 53.9 %   MCV 87.3 80.0 - 100.0 fL   MCH 28.1 26.0 - 34.0 pg   MCHC 32.2 30.0 - 36.0 g/dL   RDW 86.6 88.4 - 84.4 %   Platelets 234 150 - 400 K/uL   nRBC 0.0 0.0 - 0.2 %   Neutrophils Relative % 48 %   Neutro Abs 4.4 1.7 - 7.7 K/uL   Lymphocytes Relative 39 %   Lymphs Abs 3.6 0.7 - 4.0 K/uL   Monocytes Relative 10 %   Monocytes Absolute 1.0 0.1 - 1.0 K/uL   Eosinophils Relative 3 %   Eosinophils Absolute 0.3 0.0 - 0.5 K/uL    Basophils Relative 0 %   Basophils Absolute 0.0 0.0 - 0.1 K/uL   Immature Granulocytes 0 %   Abs Immature Granulocytes 0.02 0.00 - 0.07 K/uL  Comprehensive metabolic panel     Status: Abnormal   Collection Time: 09/18/24 11:14 AM  Result Value Ref Range   Sodium 136 135 - 145 mmol/L   Potassium 3.7 3.5 - 5.1 mmol/L  Chloride 103 98 - 111 mmol/L   CO2 22 22 - 32 mmol/L   Glucose, Bld 94 70 - 99 mg/dL   BUN <5 (L) 6 - 20 mg/dL   Creatinine, Ser 9.38 0.44 - 1.00 mg/dL   Calcium 9.0 8.9 - 89.6 mg/dL   Total Protein 7.3 6.5 - 8.1 g/dL   Albumin 4.3 3.5 - 5.0 g/dL   AST 18 15 - 41 U/L   ALT 8 0 - 44 U/L   Alkaline Phosphatase 71 38 - 126 U/L   Total Bilirubin 0.6 0.0 - 1.2 mg/dL   GFR, Estimated >39 >39 mL/min   Anion gap 11 5 - 15  Lipase, blood     Status: None   Collection Time: 09/18/24 11:14 AM  Result Value Ref Range   Lipase 28 11 - 51 U/L  hCG, quantitative, pregnancy     Status: Abnormal   Collection Time: 09/18/24 11:40 AM  Result Value Ref Range   hCG, Beta Chain, Quant, S 335 (H) <5 mIU/mL    US  Renal Result Date: 09/18/2024 CLINICAL DATA:  Left flank pain with microscopic hematuria. EXAM: RENAL / URINARY TRACT ULTRASOUND COMPLETE COMPARISON:  None Available. FINDINGS: Right Kidney: Renal measurements: 12.1 cm x 4.3 cm x 3.6 cm = volume: 96.9 mL. Echogenicity within normal limits. No mass or hydronephrosis visualized. Left Kidney: Renal measurements: 10.9 cm x 5.2 cm x 4.4 cm = volume: 13.2 mL. Echogenicity within normal limits. No mass or hydronephrosis visualized. Bladder: Appears normal for degree of bladder distention. Other: None. IMPRESSION: Unremarkable renal ultrasound. Electronically Signed   By: Suzen Dials M.D.   On: 09/18/2024 14:22   US  OB LESS THAN 14 WEEKS WITH OB TRANSVAGINAL Result Date: 09/18/2024 CLINICAL DATA:  Left lower quadrant pain EXAM: OBSTETRIC <14 WK US  AND TRANSVAGINAL OB US  TECHNIQUE: Both transabdominal and transvaginal ultrasound  examinations were performed for complete evaluation of the gestation as well as the maternal uterus, adnexal regions, and pelvic cul-de-sac. Transvaginal technique was performed to assess early pregnancy. COMPARISON:  None Available. FINDINGS: Intrauterine gestational sac: None Yolk sac:  Not Visualized. Embryo:  Not Visualized. Subchorionic hemorrhage:  None visualized. Maternal uterus/adnexae: Heterogeneous hypoechogenicity within the endometrial canal. Thick-walled cystic structure in the left adnexa abutting the left ovaries measures 1.9 x 1.8 x 1.4 cm. Presumed right ovarian corpus luteum. Small volume pelvic free fluid. IMPRESSION: 1. Probable left adnexal ectopic pregnancy. 2. Heterogeneous echogenicity within the endometrial canal, likely blood products. Critical Value/emergent results were called by telephone at the time of interpretation on 09/18/2024 at 1:55 p.m. to provider [facility] , who verbally acknowledged these results. Electronically Signed   By: Limin  Xu M.D.   On: 09/18/2024 14:17    Assessment/Plan: 38 yo P2 with ultrasound finding consistent with ectopic pregnancy - Patient in significant pain requesting pain medication - Discussed surgical management with laparoscopic salpingectomy. Risks, benefits and alternatives were explained including but not limited to risks of bleeding, infection and damage to adjacent organs. Patient verbalized understanding and all questions were answered.  Patient counseled in the presence of her husband and interpreter  Rosemarie Galvis 09/18/2024, 4:53 PM

## 2024-09-18 NOTE — ED Triage Notes (Addendum)
 Patient reports left sided flank pain that radiates to her back for a couple days. She reports some urinary symptoms as well such as discomfort.

## 2024-09-18 NOTE — Op Note (Signed)
 Preoperative diagnosis: Left ectopic pregnancy  Postoperative diagnosis: Ruptured left ectopic pregnancy with 20 cc hemoperitoneum   Procedure: Laparoscopic removal of left ectopic pregnancy and left salpingectomy                      Placement of TAP block by me for post op pain management   Surgeon: JAYNE VONN DEL   Anesthesia: Gen. Endotracheal   Findings: patient presented to [facility] and found to have a left ectopic pregnancy No obvious rupture but patient was guarding so consented for surgical management   Intraoperatively the patient had about 20 cc of hemoperitoneum and the entire left tube was incolved The right tube ovary and uterus and left ovary were all normal   Description of operation: Patient was taken to the operating room and placed in the supine position where she underwent general endotracheal anesthesia. She was placed in dorsal lithotomy position. She was prepped and draped in usual sterile fashion. Foley catheter was placed. Incision was made in the umbilicus and a varies needle was placed peritoneal cavity with one pass that difficulty. The peritoneal cavity was insufflated. A 1011 non-bladed trocar was placed using a video laparoscope under direct visualization without difficulty. An incision was made in the right  and left lower quadrant and 5 mm non-bladed trochars were placed in each site without difficulty under direct visualization. Ectopic was obvious on the left minimal hemoperitoneum 20 cc    Harmonic scalpel was used and a salpingectomy was performed. There was good hemostasis. The pelvis was irrigated and hemostasis once again confirmed. The right fallopian tube was completely normal and there were no other intraperitoneal abnormalities appreciated.   I placed a  transversus abdominus plane block  using laparoscopic guidance at T10 and T7 bilaterally, at the junction of the rectus anterior and band of oblique muscles.  10 cc of 0.25% bupivacaine  at each  site, 40 cc total = 100 mg of bupivacaine .  Doyle's bubble technique was used. This was placed for post operative pain management.   The trochars were removed and the gas was allowed to escape from the abdomen.  All 3 sites were closed, the umbilical fascia with 0 vicryl single interrupted Skin 3-0 vicryl subcuticular Dermabond was placed on top  The patient remained hemodynamically stable throughout the entire procedure was awakened from anesthesia and taken to the recovery room in good stable condition with all counts being correct.   She received 2 g of Ancef and 30 mg of Toradol  prophylactically. There was no real intraoperative blood loss only the hemoperitoneum which was appreciated and present upon peritoneal entry.  VONN DEL JAYNE, MD 09/18/2024 7:16 PM

## 2024-09-18 NOTE — Anesthesia Preprocedure Evaluation (Addendum)
 Anesthesia Evaluation  Patient identified by MRN, date of birth, ID band Patient awake    Reviewed: Allergy & Precautions, NPO status , Patient's Chart, lab work & pertinent test results  History of Anesthesia Complications Negative for: history of anesthetic complications  Airway Mallampati: II  TM Distance: >3 FB Neck ROM: Full    Dental  (+) Dental Advisory Given, Teeth Intact   Pulmonary neg pulmonary ROS   breath sounds clear to auscultation       Cardiovascular negative cardio ROS  Rhythm:Regular Rate:Normal     Neuro/Psych  Headaches    GI/Hepatic Neg liver ROS,neg GERD  ,,N/V   Endo/Other  BMI 29  Renal/GU negative Renal ROS     Musculoskeletal   Abdominal   Peds  Hematology Lab Results      Component                Value               Date                      WBC                      8.9                 09/18/2024                HGB                      12.8                09/18/2024                HCT                      39.3                09/18/2024                MCV                      87.3                09/18/2024                PLT                      234                 09/18/2024              Anesthesia Other Findings   Reproductive/Obstetrics (+) Pregnancy (ectopic)                              Anesthesia Physical Anesthesia Plan  ASA: 2 and emergent  Anesthesia Plan: General   Post-op Pain Management: Tylenol  PO (pre-op)* and Toradol  IV (intra-op)*   Induction: Intravenous, Rapid sequence and Cricoid pressure planned  PONV Risk Score and Plan: 4 or greater and Ondansetron , Dexamethasone  and Midazolam   Airway Management Planned: Oral ETT  Additional Equipment: None  Intra-op Plan:   Post-operative Plan: Extubation in OR  Informed Consent: I have reviewed the patients History and Physical, chart, labs and discussed the procedure including the  risks, benefits and alternatives for the proposed anesthesia with the  patient or authorized representative who has indicated his/her understanding and acceptance.     Dental advisory given  Plan Discussed with: CRNA and Surgeon  Anesthesia Plan Comments:          Anesthesia Quick Evaluation

## 2024-09-19 ENCOUNTER — Encounter (HOSPITAL_COMMUNITY): Payer: Self-pay | Admitting: Obstetrics & Gynecology

## 2024-09-21 LAB — CERVICOVAGINAL ANCILLARY ONLY
Chlamydia: NEGATIVE
Comment: NEGATIVE
Comment: NEGATIVE
Comment: NORMAL
Neisseria Gonorrhea: NEGATIVE
Trichomonas: NEGATIVE

## 2024-09-22 LAB — SURGICAL PATHOLOGY

## 2024-10-13 ENCOUNTER — Ambulatory Visit: Admitting: Family Medicine

## 2024-10-13 ENCOUNTER — Emergency Department (HOSPITAL_BASED_OUTPATIENT_CLINIC_OR_DEPARTMENT_OTHER)
Admission: EM | Admit: 2024-10-13 | Discharge: 2024-10-14 | Disposition: A | Attending: Emergency Medicine | Admitting: Emergency Medicine

## 2024-10-13 ENCOUNTER — Encounter: Payer: Self-pay | Admitting: Family Medicine

## 2024-10-13 ENCOUNTER — Other Ambulatory Visit: Payer: Self-pay

## 2024-10-13 ENCOUNTER — Encounter (HOSPITAL_BASED_OUTPATIENT_CLINIC_OR_DEPARTMENT_OTHER): Payer: Self-pay

## 2024-10-13 VITALS — BP 110/77 | HR 72 | Ht 63.0 in | Wt 153.0 lb

## 2024-10-13 DIAGNOSIS — Z79899 Other long term (current) drug therapy: Secondary | ICD-10-CM | POA: Insufficient documentation

## 2024-10-13 DIAGNOSIS — T8149XA Infection following a procedure, other surgical site, initial encounter: Secondary | ICD-10-CM | POA: Diagnosis present

## 2024-10-13 LAB — CBC WITH DIFFERENTIAL/PLATELET
Abs Immature Granulocytes: 0.02 K/uL (ref 0.00–0.07)
Basophils Absolute: 0 K/uL (ref 0.0–0.1)
Basophils Relative: 1 %
Eosinophils Absolute: 0.6 K/uL — ABNORMAL HIGH (ref 0.0–0.5)
Eosinophils Relative: 7 %
HCT: 39.3 % (ref 36.0–46.0)
Hemoglobin: 12.4 g/dL (ref 12.0–15.0)
Immature Granulocytes: 0 %
Lymphocytes Relative: 44 %
Lymphs Abs: 3.9 K/uL (ref 0.7–4.0)
MCH: 27.9 pg (ref 26.0–34.0)
MCHC: 31.6 g/dL (ref 30.0–36.0)
MCV: 88.3 fL (ref 80.0–100.0)
Monocytes Absolute: 0.9 K/uL (ref 0.1–1.0)
Monocytes Relative: 10 %
Neutro Abs: 3.3 K/uL (ref 1.7–7.7)
Neutrophils Relative %: 38 %
Platelets: 262 K/uL (ref 150–400)
RBC: 4.45 MIL/uL (ref 3.87–5.11)
RDW: 13.2 % (ref 11.5–15.5)
WBC: 8.8 K/uL (ref 4.0–10.5)
nRBC: 0 % (ref 0.0–0.2)

## 2024-10-13 LAB — COMPREHENSIVE METABOLIC PANEL WITH GFR
ALT: 9 U/L (ref 0–44)
AST: 18 U/L (ref 15–41)
Albumin: 4.3 g/dL (ref 3.5–5.0)
Alkaline Phosphatase: 80 U/L (ref 38–126)
Anion gap: 8 (ref 5–15)
BUN: 6 mg/dL (ref 6–20)
CO2: 25 mmol/L (ref 22–32)
Calcium: 9.2 mg/dL (ref 8.9–10.3)
Chloride: 105 mmol/L (ref 98–111)
Creatinine, Ser: 0.6 mg/dL (ref 0.44–1.00)
GFR, Estimated: >60 mL/min (ref >60)
Glucose, Bld: 96 mg/dL (ref 70–99)
Potassium: 3.8 mmol/L (ref 3.5–5.1)
Sodium: 138 mmol/L (ref 135–145)
Total Bilirubin: 0.3 mg/dL (ref 0.0–1.2)
Total Protein: 7.3 g/dL (ref 6.5–8.1)

## 2024-10-13 LAB — LACTIC ACID, PLASMA: Lactic Acid, Venous: 0.4 mmol/L — ABNORMAL LOW (ref 0.5–1.9)

## 2024-10-13 MED ORDER — OXYCODONE-ACETAMINOPHEN 5-325 MG PO TABS
1.0000 | ORAL_TABLET | Freq: Once | ORAL | Status: AC
  Administered 2024-10-14: 1 via ORAL
  Filled 2024-10-13: qty 1

## 2024-10-13 MED ORDER — MELOXICAM 15 MG PO TABS: 15.0000 mg | ORAL_TABLET | Freq: Every day | ORAL | 0 refills | Status: DC

## 2024-10-13 MED ORDER — AMOXICILLIN-POT CLAVULANATE 875-125 MG PO TABS
1.0000 | ORAL_TABLET | Freq: Once | ORAL | Status: AC
  Administered 2024-10-14: 1 via ORAL
  Filled 2024-10-13: qty 1

## 2024-10-13 NOTE — Patient Instructions (Addendum)
 Surgical infection I strongly recommend that you go to the urgency department as soon as possible to be treated. I am concerned that if you go home and wait a couple of days this infection could get much worse. You should also call the doctor who did your original surgery to talk about following up with them: [facility] [phone]  ???? ?????? ?????? ???? ??????? ??? ??? ??????? ?? ???? ??? ???? ????? ??????. ???? ?? ?????? ??? ?????? ??? ???? ??? ?????? ???????? ???? ????. ??? ????? ????? ??????? ??????? ???? ???? ??? ??????? ??????? ??????? ???????? ???: ???? ??? ????? ?????? ??? ?????? [phone] eadwaa florie leash apdypiju bialtawajuh 'iilaa qism altawari fi 'asrae waqt mumkin litalaqiy alealaji. 'riki 'an tatafaqam rexanne kappa 'iidha edt 'iilaa almanzil wantzrt bideat 'ayaamin. yajib elyk aydan alaitisal bialtabib aladhi 'ajraa lk aljirahat al'asliat limunaqashat almutabaeat maeahu: darrell rumps alsihiyi lirieayat sihat almar'a [phone]

## 2024-10-13 NOTE — ED Triage Notes (Addendum)
 Laparoscopic salpingectomy for ectopic pregnancy 3 weeks ago/ discharge out of wound 3 days, sent by PCP for further care. Fevers at night also 3 days ago. Nausea and vomiting. Loss of appetite. Took tylenol  2 hrs PTA.

## 2024-10-13 NOTE — Progress Notes (Signed)
    SUBJECTIVE:   CHIEF COMPLAINT / HPI:   Belly pain Recent ER visit 10/10 with ectopic pregnancy. Now concerned she has surgical infection. Pain in belly, and with anything that touches the skin. Skin in that area is warm. No fevers. Reports nausea, vomiting x1 yesterday, diarrhea.  She says she has had bleeding and drainage of pus from the sites. Was given abx which she did finish at home, she does not know the name of this medication.  PERTINENT  PMH / PSH: Reviewed.  OBJECTIVE:   BP 110/77   Pulse 72   Ht 5' 3 (1.6 m)   Wt 153 lb (69.4 kg)   LMP 08/27/2024   SpO2 99%   BMI 27.10 kg/m   General: Uncomfortable-appearing, no acute distress. HEENT: normocephalic, EOM grossly intact, MMM Cardio: Regular rate, regular rhythm, no murmurs on exam. Pulm: No increased work of breathing. Abdominal: Surgical incision just above umbilicus and second incision RLQ are both erythematous, warm to the touch, and extremely tender.  In fact patient is holding her clothing off of these areas during encounter.  No active drainage at this time. Extremities: no peripheral edema. Moves all extremities equally. Neuro: Alert and oriented x3, speech normal in content, no facial asymmetry Psych:  Cognition and judgment appear intact.   ASSESSMENT/PLAN:   Assessment & Plan Surgical site infection I am very concerned that her abdominal surgical sites are infected, possibly tracking deeper into the peritoneum.  I am reassured that she is afebrile and vital signs are stable at this time; she is appropriate to take herself to the emergency department. - Strongly urged patient to report to the emergency department immediately for further treatment; anticipate she will need numbing, ultrasound or other imaging, and drainage of these areas. - Also advised patient that when she is able she should contact the surgeon/office who originally did her surgery and follow-up with them. -Patient is in agreement and  states that she is able to take herself to the emergency department.    Lauraine Norse, DO [facility]

## 2024-10-14 ENCOUNTER — Ambulatory Visit: Admitting: Obstetrics & Gynecology

## 2024-10-14 ENCOUNTER — Encounter: Payer: Self-pay | Admitting: Obstetrics & Gynecology

## 2024-10-14 VITALS — BP 109/74 | HR 85 | Temp 98.1°F | Wt 152.8 lb

## 2024-10-14 DIAGNOSIS — L03311 Cellulitis of abdominal wall: Secondary | ICD-10-CM | POA: Diagnosis not present

## 2024-10-14 DIAGNOSIS — Z4889 Encounter for other specified surgical aftercare: Secondary | ICD-10-CM

## 2024-10-14 MED ORDER — AMOXICILLIN-POT CLAVULANATE 875-125 MG PO TABS: 1.0000 | ORAL_TABLET | Freq: Two times a day (BID) | ORAL | 0 refills | Status: DC

## 2024-10-14 MED ORDER — SULFAMETHOXAZOLE-TRIMETHOPRIM 800-160 MG PO TABS: 1.0000 | ORAL_TABLET | Freq: Two times a day (BID) | ORAL | 0 refills | Status: AC

## 2024-10-14 NOTE — Discharge Instructions (Signed)
 You were seen today with concerns for wound infection.  Your lab work is reassuring.  Start antibiotics.  It is very important that you call Dr. Genie office for postoperative evaluation.

## 2024-10-14 NOTE — Progress Notes (Signed)
 GYN VISIT Patient name: Nancy Oconnor MRN [id number]  Date of birth: 15-Nov-1986 Chief Complaint:   Routine Post Op (Wound infection)  History of Present Illness:   Nancy Oconnor is a 38 y.o. G59P2010 female being seen today for concern for cellulitis- s/p left salpingectomy with removal of ectopic completed on 09/18/2024.  She was not sure about follow-up in was recently seen by both her PCP and ED and was advised to follow-up here.  It sounds like initially after surgery she was doing well.  However for the past week she has noted worsening pain.  She also notes some nausea and vomiting.  It had 3 bouts of diarrhea yesterday.  Denies fevers or chills.  States that because she is worried she has not been eating as much as she normally does.  It is unclear as to whether an antibiotic has been sent in, but she states she has not yet taken anything  Contraception: Vasectomy  Patient's last menstrual period was 08/27/2024.    Review of Systems:   Pertinent items are noted in HPI Denies fever/chills, dizziness, headaches, visual disturbances, fatigue, shortness of breath, chest pain. Pertinent History Reviewed:   Past Surgical History:  Procedure Laterality Date   CESAREAN SECTION     DIAGNOSTIC LAPAROSCOPY WITH REMOVAL OF ECTOPIC PREGNANCY N/A 09/18/2024   Procedure: LAPAROSCOPY, WITH ECTOPIC PREGNANCY SURGICAL TREATMENT;  Surgeon: Jayne Vonn DEL, MD;  Location: [facility];  Service: Gynecology;  Laterality: N/A;   IUD REMOVAL N/A 10/08/2016   Procedure: INTRAUTERINE DEVICE (IUD) REMOVAL;  Surgeon: Ted Rogena Solo, DO;  Location: [facility];  Service: Gynecology;  Laterality: N/A;   LAPAROSCOPY N/A 10/08/2016   Procedure: LAPAROSCOPY DIAGNOSTIC;  Surgeon: Ted Rogena Solo, DO;  Location: [facility];  Service: Gynecology;  Laterality: N/A;   LYSIS OF ADHESION N/A 10/08/2016   Procedure: LYSIS OF ADHESION;  Surgeon: Ted Rogena Solo, DO;  Location: [facility];  Service: Gynecology;   Laterality: N/A;    Past Medical History:  Diagnosis Date   Abortion in first trimester 04/08/2023   Chest pain    limited exertion for past 4 days   Chills    Complicated UTI (urinary tract infection) 03/10/2015   Dry cough    2 years   Dyspnea    Dysuria 02/06/2022   Headache    Healthcare maintenance 09/20/2021   Hypocalcemia 09/20/2021   Noted on previous labs from 2018.    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/06/2022   Vaginal candidiasis 02/06/2022   Vaginal irritation 09/20/2022   Reviewed problem list, medications and allergies. Physical Assessment:   Vitals:   10/14/24 1623  BP: 109/74  Pulse: 85  Temp: 98.1 F (36.7 C)  Weight: 152 lb 12.8 oz (69.3 kg)  Body mass index is 28.87 kg/m.       Physical Examination:   General appearance: alert, well appearing, and in no distress  Psych: mood appropriate, normal affect  Skin: warm & dry   Cardiovascular: normal heart rate noted  Respiratory: normal respiratory effort, no distress  Abdomen: soft, no rebound or guarding.  Minimal erythema surrounding supraumbilical and right lower quadrant port, improved from prior cellulitis marking.  Positive bowel sounds, minimal tenderness.  Pelvic: examination not indicated  Extremities: no edema   Chaperone: N/A  Assessment & Plan:  1) Abdominal cellulitis - Reassured patient that it appears as though infection is already improving - Antibiotic sent in to take for the next week - Based on her symptoms also suspect she may have a GI bug.  Encouraged hydration and adequate nutrition - Patient given information for [facility] [place] as she states she would prefer not to return to this office - Okay to continue with Tylenol  on ibuprofen  as needed   Meds ordered this encounter   Medications   sulfamethoxazole-trimethoprim (BACTRIM DS) 800-160 MG tablet    Sig: Take 1 tablet by mouth 2 (two) times daily for 7 days.    Dispense:  14 tablet    Refill:  0     Return in about 3 months (around 01/14/2025), or if symptoms worsen or fail to improve, for -please give information for .   Delaney Perona, DO Attending Obstetrician & Gynecologist, [facility] for [employer], [facility]

## 2024-10-14 NOTE — ED Notes (Signed)
 Dc instructions given, pt verbalized understanding. Out of ED with steady gait, not in visible distress.

## 2024-10-14 NOTE — ED Provider Notes (Signed)
 [place] EMERGENCY DEPARTMENT AT [facility] Provider Note   CSN: [id number] Arrival date & time: 10/13/24  1818     Patient presents with: Wound Infection   Nancy Oconnor is a 38 y.o. female.   HPI     This is a 38 year old female who presents with concern for surgical site infection.  She had a laparoscopic salpingectomy for an ectopic pregnancy on 10/10.  She did not follow-up with OB/GYN.  It appears there was a miscommunication.  She did follow-up earlier today with her primary doctor because she noted redness and pain at her incision sites.  She has also noted some drainage from the right lower abdominal incision.  She has not had any fevers.  Concern for possible infection.  She has not been putting any antibiotic ointment on the sites.  Prior to Admission medications   Medication Sig Start Date End Date Taking? Authorizing Provider  amoxicillin-clavulanate (AUGMENTIN) 875-125 MG tablet Take 1 tablet by mouth every 12 (twelve) hours. 10/14/24  Yes Jakyren Fluegge, Charmaine FALCON, MD  benzonatate  (TESSALON ) 100 MG capsule Take 1 capsule (100 mg total) by mouth every 8 (eight) hours. 08/22/24   Johnie Flaming A, NP  Bismuth  Subsalicylate (PEPTO-BISMOL) 262 MG TABS Take 2 tablets (524 mg total) by mouth in the morning, at noon, in the evening, and at bedtime. 08/17/24   McDiarmid, Krystal BIRCH, MD  dicyclomine  (BENTYL ) 20 MG tablet Take 1 tablet (20 mg total) by mouth every 6 (six) hours. 08/11/24   Shitarev, Dimitry, MD  DULoxetine (CYMBALTA) 30 MG capsule Take 1 capsule (30 mg total) by mouth at bedtime. 09/17/24   Alena Morrison, Reagan, MD  ketorolac  (TORADOL ) 10 MG tablet Take 1 tablet (10 mg total) by mouth every 8 (eight) hours as needed. 09/18/24   Jayne Vonn DEL, MD  lidocaine  (LIDODERM ) 5 % Place 1 patch onto the skin daily. Remove & Discard patch within 12 hours or as directed by MD 09/03/24   Alena Morrison, Reagan, MD  meloxicam  (MOBIC ) 15 MG tablet Take 1 tablet (15 mg total)  by mouth daily. 10/13/24   Lafe Domino, DO  metroNIDAZOLE  (FLAGYL ) 250 MG tablet Take 1 tablet (250 mg total) by mouth 4 (four) times daily. 08/17/24   McDiarmid, Krystal BIRCH, MD  ondansetron  (ZOFRAN ) 4 MG tablet Take 1 tablet (4 mg total) by mouth every 8 (eight) hours as needed for nausea or vomiting. 06/01/24   Dameron, Marisa, DO  ondansetron  (ZOFRAN -ODT) 8 MG disintegrating tablet Take 1 tablet (8 mg total) by mouth every 8 (eight) hours as needed for nausea or vomiting. 09/18/24   Jayne Vonn DEL, MD  oxyCODONE -acetaminophen  (PERCOCET) 5-325 MG tablet Take 1 tablet by mouth every 6 (six) hours as needed for severe pain (pain score 7-10). 09/18/24   Jayne Vonn DEL, MD  pantoprazole  (PROTONIX ) 40 MG tablet Take 1 tablet (40 mg total) by mouth 2 (two) times daily. 08/17/24   McDiarmid, Krystal BIRCH, MD  Prenatal Vit-Fe Fumarate-FA (PRENATAL VITAMIN) 27-0.8 MG TABS Take 1 tablet by mouth daily. Patient not taking: Reported on 09/17/2024 09/03/24   Alena Morrison, Reagan, MD  tetracycline  (SUMYCIN ) 500 MG capsule Take 1 capsule (500 mg total) by mouth 4 (four) times daily. 08/17/24   McDiarmid, Krystal BIRCH, MD    Allergies: Other    Review of Systems  Constitutional:  Negative for fever.  Respiratory:  Negative for shortness of breath.   Cardiovascular:  Negative for chest pain.  Gastrointestinal:  Positive for abdominal pain.  Skin:  Positive for color change and wound.  All other systems reviewed and are negative.   Updated Vital Signs BP 119/86 (BP Location: Right Arm)   Pulse 85   Temp 98.1 F (36.7 C) (Oral)   Resp 16   Ht 1.549 m (5' 1)   Wt 69.4 kg   LMP 08/27/2024   SpO2 100%   Breastfeeding Unknown   BMI 28.91 kg/m   Physical Exam Vitals and nursing note reviewed.  Constitutional:      Appearance: She is well-developed. She is not ill-appearing.  HENT:     Head: Normocephalic and atraumatic.  Eyes:     Pupils: Pupils are equal, round, and reactive to light.  Cardiovascular:     Rate  and Rhythm: Normal rate and regular rhythm.     Heart sounds: Normal heart sounds.  Pulmonary:     Effort: Pulmonary effort is normal. No respiratory distress.     Breath sounds: No wheezing.  Abdominal:     Palpations: Abdomen is soft.     Tenderness: There is abdominal tenderness.     Comments: Laparoscopic incisions at the umbilicus and right lower quadrant with limited surrounding erythema, there is some tenderness to palpation, no obvious fluctuance, unable to express any fluid  Musculoskeletal:     Cervical back: Neck supple.  Skin:    General: Skin is warm and dry.  Neurological:     Mental Status: She is alert and oriented to person, place, and time.  Psychiatric:        Mood and Affect: Mood normal.     (all labs ordered are listed, but only abnormal results are displayed) Labs Reviewed  LACTIC ACID, PLASMA - Abnormal; Notable for the following components:      Result Value   Lactic Acid, Venous 0.4 (*)    All other components within normal limits  CBC WITH DIFFERENTIAL/PLATELET - Abnormal; Notable for the following components:   Eosinophils Absolute 0.6 (*)    All other components within normal limits  COMPREHENSIVE METABOLIC PANEL WITH GFR  LACTIC ACID, PLASMA  PREGNANCY, URINE    EKG: None  Radiology: No results found.   Procedures   Medications Ordered in the ED  amoxicillin-clavulanate (AUGMENTIN) 875-125 MG per tablet 1 tablet (has no administration in time range)  oxyCODONE -acetaminophen  (PERCOCET/ROXICET) 5-325 MG per tablet 1 tablet (has no administration in time range)    Clinical Course as of 10/14/24 0023  Tue Oct 13, 2024  2341 Spoke to Dr. Eveline OB.  Agrees with plan and follow-up with Dr. Jayne.  Commend starting on Augmentin. [CH]    Clinical Course User Index [CH] Jullien Granquist, Charmaine FALCON, MD                                 Medical Decision Making Amount and/or Complexity of Data Reviewed Labs: ordered.  Risk Prescription drug  management.   This patient presents to the ED for concern of postop infection, this involves an extensive number of treatment options, and is a complaint that carries with it a high risk of complications and morbidity.  I considered the following differential and admission for this acute, potentially life threatening condition.  The differential diagnosis includes infection, abscess, seroma  MDM:    This is a 37 year old female who presents with concern for postop infection.  She is nontoxic and vital signs are reassuring.  She has very localized tenderness at  the periumbilical and right lower quadrant laparoscopic incision sites.  They are slightly erythematous without obvious fluctuance.  She reports drainage but I am unable to express any purulence and there is no dehiscence of the wounds.  Labs are reassuring.  No leukocytosis.  Do not feel she needs imaging.  These are likely localized infection versus contact dermatitis although she denies any topical treatments or Neosporin.  Discussed with OB.  Will provide follow-up information for Dr. Jayne and recommends Augmentin for antibiotic coverage.  Patient and family understand instructions.  (Labs, imaging, consults)  Labs: I Ordered, and personally interpreted labs.  The pertinent results include: CBC, CMP  Imaging Studies ordered: I ordered imaging studies including none I independently visualized and interpreted imaging. I agree with the radiologist interpretation  Additional history obtained from chart review.  External records from outside source obtained and reviewed including prior evaluations and operative notes  Cardiac Monitoring: The patient was not maintained on a cardiac monitor.  If on the cardiac monitor, I personally viewed and interpreted the cardiac monitored which showed an underlying rhythm of: N/A  Reevaluation: After the interventions noted above, I reevaluated the patient and found that they have :stayed the  same  Social Determinants of Health:  lives independently  Disposition: Discharge with OB follow-up  Co morbidities that complicate the patient evaluation  Past Medical History:  Diagnosis Date   Abortion in first trimester 04/08/2023   Chest pain    limited exertion for past 4 days   Chills    Complicated UTI (urinary tract infection) 03/10/2015   Dry cough    2 years   Dyspnea    Dysuria 02/06/2022   Headache    Healthcare maintenance 09/20/2021   Hypocalcemia 09/20/2021   Noted on previous labs from 2018.    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/06/2022   Vaginal candidiasis 02/06/2022   Vaginal irritation 09/20/2022     Medicines Meds ordered this encounter  Medications   amoxicillin-clavulanate (AUGMENTIN) 875-125 MG per tablet 1 tablet   oxyCODONE -acetaminophen  (PERCOCET/ROXICET) 5-325 MG per tablet 1 tablet    Refill:  0   amoxicillin-clavulanate (AUGMENTIN) 875-125 MG tablet    Sig: Take 1 tablet by mouth every 12 (twelve) hours.    Dispense:  14 tablet    Refill:  0    I have reviewed the patients home medicines and have made adjustments as needed  Problem List / ED Course: Problem List Items Addressed This Visit   None Visit Diagnoses       Cellulitis, wound, post-operative    -  Primary                Final diagnoses:  Cellulitis, wound, post-operative    ED Discharge Orders          Ordered    amoxicillin-clavulanate (AUGMENTIN) 875-125 MG tablet  Every 12 hours        10/14/24 0022               Bari Charmaine FALCON, MD 10/14/24 [phone]

## 2024-10-15 ENCOUNTER — Ambulatory Visit: Admitting: Family Medicine

## 2024-10-15 ENCOUNTER — Ambulatory Visit

## 2024-11-23 ENCOUNTER — Emergency Department (HOSPITAL_COMMUNITY): Admission: EM | Admit: 2024-11-23 | Discharge status: Against Medical Advice | Source: Home / Self Care

## 2024-11-23 ENCOUNTER — Ambulatory Visit

## 2024-11-23 ENCOUNTER — Ambulatory Visit (HOSPITAL_COMMUNITY): Admission: EM | Admit: 2024-11-23 | Discharge: 2024-11-23 | Disposition: A | Source: Ambulatory Visit

## 2024-11-23 ENCOUNTER — Ambulatory Visit (HOSPITAL_COMMUNITY)
Admission: RE | Admit: 2024-11-23 | Discharge: 2024-11-23 | Disposition: A | Source: Ambulatory Visit | Attending: Family Medicine | Admitting: Family Medicine

## 2024-11-23 ENCOUNTER — Inpatient Hospital Stay (HOSPITAL_COMMUNITY): Admission: EM | Admit: 2024-11-23 | Payer: Self-pay | Source: Ambulatory Visit

## 2024-11-23 VITALS — BP 110/64 | HR 73 | Ht 61.0 in | Wt 149.8 lb

## 2024-11-23 DIAGNOSIS — M542 Cervicalgia: Secondary | ICD-10-CM

## 2024-11-23 MED ORDER — KETOROLAC TROMETHAMINE 30 MG/ML IJ SOLN
30.0000 mg | Freq: Once | INTRAMUSCULAR | Status: AC
  Administered 2024-11-23: 17:00:00 30 mg via INTRAMUSCULAR

## 2024-11-23 MED ORDER — KETOROLAC TROMETHAMINE 15 MG/ML IJ SOLN: 15.0000 mg | Freq: Once | INTRAMUSCULAR | Status: DC

## 2024-11-23 NOTE — Patient Instructions (Addendum)
 It was wonderful to see you today.  Please bring ALL of your medications with you to every visit.    VISIT SUMMARY: You came in today with a headache and neck pain that has been bothering you for the past four days. The pain is worse at night and partially relieved by Advil  and Tylenol . You also mentioned feeling fatigued and having a cough since the onset of your symptoms. We discussed your recent treatment for an H. pylori infection and your current medication and supplement use.  YOUR PLAN: -ACUTE NECK PAIN WITH ASSOCIATED HEADACHE: Your neck pain and headache are likely due to muscle strain, possibly from carrying heavy objects. We gave you an intramuscular injection to help with the pain. We also ordered a neck x-ray to get a better look at what might be causing your symptoms since it is new and also you have pain in the middle of your neck For now, avoid taking ibuprofen  or Advil  while the injection is working, but you can continue to use Tylenol  if needed. I have also referred you to physical therapy.   INSTRUCTIONS: Please follow up in one week to assess your pain management and the effectiveness of the treatment. If your symptoms worsen or you experience new symptoms, contact our office immediately.  Contains text generated by [employer].   Thank you for choosing [facility].   Please call [phone] with any questions about today's appointment.   Areta Saliva, MD  Family Medicine

## 2024-11-23 NOTE — Progress Notes (Unsigned)
° °  SUBJECTIVE:   CHIEF COMPLAINT / HPI:  Discussed the use of AI scribe software for clinical note transcription with the patient, who gave verbal consent to proceed.  History of Present Illness Nancy Oconnor is a 38 year old female who presents with headache and neck pain.  Headache and neck pain - Unilateral heavy headache with same-side neck pain for 4 days - Pain worsens at night and disrupts sleep - Partial relief with Advil  and Tylenol , used two to three times daily - Persistent pain, especially at night - Pain radiates from neck down both arms - No associated weakness or dropping objects - No vision changes - No recent trauma  Constitutional symptoms - Fatigue present since symptom onset  Respiratory symptoms - Cough present since onset of headache and neck pain  Gastrointestinal history - Recently treated for H. pylori infection - No current stomach pain or burning  Medication and supplement use - Not taking Protonix  or other regular medications - Occasional use of powder supplements    PERTINENT  PMH / PSH: Recent treated Hpylori infection,   OBJECTIVE:  BP 110/64   Pulse 73   Ht 5' 1 (1.549 m)   Wt 149 lb 12.8 oz (67.9 kg)   SpO2 100%   BMI 28.30 kg/m   Physical Exam NECK: Neck pain present, worse in the center and on both sides.  General: well appearing, in no acute distress CV: RRR, radial pulses equal and palpable, no BLE edema  Resp: Normal work of breathing on room air, CTAB Abd: Soft, non tender, non distended  Neuro: Alert & Oriented x 4   ASSESSMENT/PLAN:   Assessment & Plan Acute neck pain   Assessment and Plan Assessment & Plan Acute neck pain with associated headache Acute neck pain radiating to arms, worse at night, partially relieved by Advil  and Tylenol . No vision changes or weakness. Likely musculoskeletal, possibly from carrying heavy objects. Consideration of H. pylori affecting NSAID use. - Administered intramuscular  injection for pain relief. - Ordered neck x-ray. - Advised against ibuprofen  or Advil  while injection is effective; Tylenol  allowed. - Scheduled follow-up in one week to assess pain management and treatment efficacy.     Areta Saliva, MD [facility]

## 2024-11-23 NOTE — ED Triage Notes (Signed)
 Quick triage note: Pt to ED c/o neck pain x 4 days, no known injury, here today from PCP for imaging.

## 2024-11-30 ENCOUNTER — Ambulatory Visit (INDEPENDENT_AMBULATORY_CARE_PROVIDER_SITE_OTHER): Payer: Self-pay

## 2024-11-30 VITALS — BP 114/77 | HR 109 | Ht 61.0 in | Wt 151.6 lb

## 2024-11-30 DIAGNOSIS — R6883 Chills (without fever): Secondary | ICD-10-CM | POA: Diagnosis not present

## 2024-11-30 DIAGNOSIS — J069 Acute upper respiratory infection, unspecified: Secondary | ICD-10-CM

## 2024-11-30 DIAGNOSIS — M542 Cervicalgia: Secondary | ICD-10-CM | POA: Diagnosis not present

## 2024-11-30 MED ORDER — DICLOFENAC SODIUM 1 % EX GEL: 2.0000 g | Freq: Four times a day (QID) | CUTANEOUS | 1 refills | Status: AC | PRN

## 2024-11-30 NOTE — Patient Instructions (Addendum)
??? ??????? ° °????? ??????? ?? ??????? ?????! ??? ????? ???? ????? ???? ???? ????? ?? ???? ????? ?? ??????????. ????? ??? ????? ????? ?? ??????????? ?????? ??????? ???? ??? ?? ?????. ????? ???? ?????? ?? ?? ??? ????????? ??? ???? ???? ???? ?? ????? ??????. ????? ??? ??? ????? ????? ?? ??????? ???????? ??? ??????? ?? ????? ??????? ????? ????? ????? ???????. ??? ????? ?????? ??????? ?? ????? ????? ????? ????? ????? ?? ??? ????? ????? ?? ???? ??????? ?????? ????? ?????? ?????? ??? ???. ° °?? ???? ??????? ?? ???? ?? ?????? ???????. ????? ?? ??? ????? ????? ???????. ??? ????? ??? ?? ????????? ????? ?????? ???????? ???   4 ???? ??????? ?????? ??? ?????. ??????? ???????? ??????? ?? ??????? ??? ??????. ????? ?????? ???????? ??????? ?????? ?? ??? ??????. ???? ?????? ?? ?????? ?????? ???????? ???????? ??? ???? ????? ??? ????? ???. ??????? ???? ????? ???????? ?????? ?????? ??????? ????? ??????.  ????? ??? ??? ????? ?????? ?? ????? ????? ?? ???? ??????? ??????! ???????? ?????? ???????? ???? ??? ???? ??? ?????? [address] ???? ???? ?????? ?????????? ???? ???????? [phone]  ....................................................................................  Nancy Oconnor,   It was great seeing you in clinic today! You came in for cold symptoms, which could be due to the flu. We are testing you for flu, and results will return in a day or so. Because we don't know for sure this is flu, there is no specific treatment at this time. Make sure you are staying hydrated and getting rest, and you should start to feel better by the end of the week. If you start feeling worse, if you start coughing up a green mucous, or if you feel better but then feel worse, please return for evaluation.  You also continued to report neck pain and headache. I think this is related to muscle tension. I sent in Voltaren  gel, which you can use up to 4 times a day, for this pain. Use heat/ice on your neck. Do stretching exercises,  included later in this packet. Make an appointment with the physical therapist; let us  know if you need another referral. We will also reach out with your Xray results once they are back.  Thank you for allowing me to be a part of your care team! Alan Flies, MD [facility] [address] [place], [place], [place] [address] [phone]

## 2024-11-30 NOTE — Progress Notes (Signed)
" ° ° °  SUBJECTIVE:   CHIEF COMPLAINT / HPI:   Nancy Oconnor is a 38 y.o. female presenting for headache and neck pain.  Headache/Neck Pain Headache/neck pain since earlier this month; seen for this on 11/23/24. Still has a lot of pain, wondering about repeat Toradol  shot; got this on 12/15 and helped. Cervical Xray done, no read yet. Referred to physical therapy, has not yet started. Similar to earlier neck pain documented at prior visits (prior to 12/15), but worse. Pain starts in neck and radiates down her arms, L>R. Feels like her shoulders are heavy and difficult to move. Reports tingling in bilateral arms. Pain is intermittent. Worsens with activity, better with rest.   URI Patient believes she has the flu. Feels cold, and has cough. Cough not productive. No fever. Symptoms began 1 day ago. Did recently visit with a person who had the flu 2 days ago. Has not been tested for flu yet. Got flu vaccine 07/2024.  Healthcare Maintenance: Will address at later appointment - Tdap - HPV - COVID19 - Mammogram (last 01/2018)   OBJECTIVE:   BP 114/77   Pulse (!) 109   Ht 5' 1 (1.549 m)   Wt 151 lb 9.6 oz (68.8 kg)   LMP 10/17/2024   SpO2 100%   BMI 28.64 kg/m    General: Pt seated in chair wearing mask, appears tired, no acute distress. HEENT: No gross abnormalities felt of neck. Tenderness to palpation along bilateral trapezius muscles. Cardiovascular: Regular rate and rhythm, no murmurs/rubs/gallops. Respiratory: Normal work of breathing on room air. Clear to auscultation bilaterally; no wheezes, crackles. Neuro: Alert and appropriately responding to questions. PERRL bilaterally.   ASSESSMENT/PLAN:   Assessment & Plan Acute neck pain No significant changes from last week.  Low suspicion for anything skeletal or neurologic.  Strongly suspect pain related to muscle tension.  Cervical x-ray not yet read, but on brief review no significant fracture, masses, abnormalities noted. -  Voltaren  gel prescribed - Neck exercises provided in Arabic - Encouraged patient to follow up with previously sent PT referral - Heat/ice recommended Chills Viral upper respiratory tract infection Symptoms in line with viral illness.  Lung exam unremarkable, no concern for pneumonia at this time.  Given unclear if this is flu, no Tamiflu indicated at this time. - Flu/COVID swab collected for send out, pending - Supportive measures discussed - Return precautions discussed    Alan Flies, MD [facility] "

## 2024-12-03 ENCOUNTER — Ambulatory Visit: Payer: Self-pay

## 2024-12-03 LAB — COVID-19, FLU A+B NAA
Influenza A, NAA: DETECTED — AB
Influenza B, NAA: NOT DETECTED
SARS-CoV-2, NAA: NOT DETECTED

## 2024-12-03 NOTE — Progress Notes (Signed)
 Spoke to patient over the phone to relay positive Flu A results. She states she is already feeling better. Discussed that she is out of the window for Tamiflu and discussed continuing supportive care with good oral hydration, tea, honey, and rest. Patient verbalizes understanding.

## 2024-12-07 ENCOUNTER — Ambulatory Visit: Payer: Self-pay | Admitting: Family Medicine

## 2024-12-25 ENCOUNTER — Other Ambulatory Visit: Payer: Self-pay

## 2025-01-01 ENCOUNTER — Ambulatory Visit: Admitting: Family Medicine

## 2025-01-01 ENCOUNTER — Encounter: Payer: Self-pay | Admitting: Family Medicine

## 2025-01-01 VITALS — BP 106/83 | HR 69 | Ht 61.0 in | Wt 153.0 lb

## 2025-01-01 DIAGNOSIS — H9202 Otalgia, left ear: Secondary | ICD-10-CM | POA: Diagnosis not present

## 2025-01-01 DIAGNOSIS — M542 Cervicalgia: Secondary | ICD-10-CM

## 2025-01-01 DIAGNOSIS — K5904 Chronic idiopathic constipation: Secondary | ICD-10-CM

## 2025-01-01 MED ORDER — FLUTICASONE PROPIONATE 50 MCG/ACT NA SUSP: 2.0000 | Freq: Every day | NASAL | 6 refills | Status: AC

## 2025-01-01 MED ORDER — KETOROLAC TROMETHAMINE 30 MG/ML IJ SOLN: 30.0000 mg | Freq: Once | INTRAMUSCULAR | Status: DC

## 2025-01-01 NOTE — Patient Instructions (Addendum)
?????? ????? ??: ° °  1. ????? ???????? ????? ??????? ???? ?????? ??? ??????. ?? ??????? ??? ???????? ?????? ?? ????? ??? ??? ??? ????? ???????. ?????? ????? ?????? ????? ????? ?? ??????? ???????? ?? ??????? ????????? ?????????? ??????. ?????? ????? ????? ?????? ??????? ???????? ??? ????? ???? ???????? ??? ???? ???????? ?? ?????????. ??? ?????? ????? ????? ????????? ?? ???? ???????? ???? ???? ????. 2. ??????? ?????????? ???????? ?? ??????? ??? ??? ???????? ?? ??????? ?? ???? ??????? ?? ????? ?? ??????. ????? ????? ?????? ?? ??? ???? ?????? ?????? ????? ???? ??? ???? ?????????? ????? ????????? ???????? ??? ??? ????? ?? ?? ???? ??? ??????.  ?. ????? ??????? ???? ?????? ??????? ?????? ???? ????? ???? ??????. ????? ????? ????? ???? ???????? ????? ??? ??? ???? ??? ???? ???? ?????? ?????? ????? ??????? ??????? ???????? ???????? ??? ??????.  ???? ??? ???? ?????? ??????? ?????? ?????? ????????? ?? ????????? ???? ???? ???? ????? ?????????? ????????? ???????? ????? ???-???-????  ????? ?????? ???? ??????? ??????? ??? ????? ????.  It was wonderful to see you today! Thank you for choosing [facility].   Please bring ALL of your medications with you to every visit.   Today we talked about:  For your constipation please use the Miralax  at needed for constipation. You may need to use it daily or every other depending on your diet. I recommend also eating plenty of fiber that is found in fruits, vegetables, beans and lentils. You can also take fiber supplementation to help be regular such as psyllium husk or metamucil. You can also take Dolcolax or senna tablets found over the counter as well. For the bumps on your neck, these are lymph nodes that are likely reactive for a virus or inflammation. Given you have some ear pain we can try an antiinflammatory nasal spray called Flonase  that you use 1 spray in each nostril every day.  Please call the physical therapy to schedule an appointment for your  neck pain. Please also use Tylenol  up to 1000mg  4 times per day for breakthrough pain and continue home exercise as needed.  [facility] at [facility]. [address] [place], [place]  [address] [phone]  Please follow up in 3 months with PCP  Call the clinic at [phone] if your symptoms worsen or you have any concerns.  Please be sure to schedule follow up at the front desk before you leave today.   Izetta Nap, DO Family Medicine

## 2025-01-01 NOTE — Progress Notes (Signed)
" ° ° °  SUBJECTIVE:   CHIEF COMPLAINT / HPI:   Discussed the use of AI scribe software for clinical note transcription with the patient, who gave verbal consent to proceed.  Head and neck pain - Head and neck pain improved somewhat over the past 1 to 2 weeks with home neck exercises performed once or twice daily, as taught from her husband's physical therapy program - Uses previously prescribed topical medication for pain - No use of oral pain medications - Received a Toradol  injection two months ago with significant relief of neck pain and requests another injection today due to prior effectiveness  Left ear pain - Intermittent ear pain present for some time - No ear drainage - Nasal swelling and dryness - No throat pain, tooth pain or mouth pain - Notices intermittent swelling on the left side of her neck  Chronic constipation - Chronic constipation managed with daily Miralax  but then reports this was causing her stools to become too loose so she stopped taking it - Difficulty passing gas - Desires to try a different constipation medication  PERTINENT  PMH / PSH: None  OBJECTIVE:   BP 106/83   Pulse 69   Ht 5' 1 (1.549 m)   Wt 153 lb (69.4 kg)   SpO2 100%   BMI 28.91 kg/m    General: NAD, pleasant, able to participate in exam HEENT: TM clear bilaterally.  Nonerythematous ear canals bilaterally.  Left ear without drainage or foreign body.  Small, shotty, tender lymph node in submandibular region. Cardiac: RRR, no murmurs. Respiratory: CTAB, normal effort, No wheezes, rales or rhonchi Extremities: no edema or cyanosis. Skin: warm and dry, no rashes noted Neuro: alert, no obvious focal deficits Psych: Normal affect and mood  ASSESSMENT/PLAN:   Assessment & Plan Neck pain Previously diagnosed with cervical strain, improving with home exercises.  Requesting formal PT, previously referred and provided with scheduling information.  Requested Toradol  injection, LMP 3+ weeks  ago therefore cannot be reasonably certain not pregnant given age and not on contraception.  Continue with Tylenol  for pain management and return to care post menstruation if desires Toradol  injection. -Provided with PT scheduling information -Continue home exercises and Tylenol  for pain management Left ear pain Normal exam, small tender lymph node in submandibular region possibly due to inadequate drainage or a eustachian tube dysfunction.  Will trial nasal saline/Flonase  for symptomatic relief. -Start Flonase  2 sprays in each nostril daily Chronic idiopathic constipation Previously on daily MiraLAX  but reports this is making her stools too runny, recommended titration back and utilization as needed for daily soft BM.  Patient adamant about trying constipation alternative, recommended utilization of other OTC options such as senna, fiber supplementation or Dulcolax to achieve desired bowel regimen.    Dr. Izetta Nap, DO [facility]     "

## 2025-01-04 ENCOUNTER — Ambulatory Visit: Payer: Self-pay

## 2025-01-08 ENCOUNTER — Ambulatory Visit

## 2025-01-08 VITALS — BP 121/85 | HR 78 | Wt 154.4 lb

## 2025-01-08 DIAGNOSIS — M545 Low back pain, unspecified: Secondary | ICD-10-CM

## 2025-01-08 MED ORDER — CYCLOBENZAPRINE HCL 5 MG PO TABS: 5.0000 mg | ORAL_TABLET | Freq: Three times a day (TID) | ORAL | 0 refills | Status: AC | PRN

## 2025-01-08 MED ORDER — KETOROLAC TROMETHAMINE 30 MG/ML IJ SOLN
30.0000 mg | Freq: Once | INTRAMUSCULAR | Status: AC
  Administered 2025-01-08: 30 mg via INTRAMUSCULAR

## 2025-01-08 NOTE — Progress Notes (Signed)
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 Back: paraspinal muscle tenderness throughout entire back, no bony step offs, mild lumbar midline tenderness, no overlying skin changes  Neuro: moves all four extremities appropriately, 5/5 strength Psych: Appropriate mood and affect   ASSESSMENT/PLAN:   Assessment & Plan Acute bilateral low back pain, unspecified whether sciatica present Most likely after sleeping wrong since patient has not been exerting or overworking herself and pain is primarily musculoskeletal.  - Toradol  30mg  injection today  - Flexeril  5mg  TID PRN, take at night  - Supportive care with Tylenol , heating pad, warm baths, rest, stretching   Camie Dixons, DO [facility] "
